# Patient Record
Sex: Male | Born: 1935 | Race: White | Hispanic: No | Marital: Married | Smoking: Never smoker
Health system: Southern US, Community
[De-identification: ages and names within clinical notes are randomized; demographics above are authoritative.]

## PROBLEM LIST (undated history)

## (undated) DIAGNOSIS — M5136 Other intervertebral disc degeneration, lumbar region: Secondary | ICD-10-CM

## (undated) DIAGNOSIS — I4891 Unspecified atrial fibrillation: Secondary | ICD-10-CM

## (undated) DIAGNOSIS — R739 Hyperglycemia, unspecified: Secondary | ICD-10-CM

## (undated) DIAGNOSIS — M5116 Intervertebral disc disorders with radiculopathy, lumbar region: Secondary | ICD-10-CM

## (undated) DIAGNOSIS — I480 Paroxysmal atrial fibrillation: Secondary | ICD-10-CM

## (undated) DIAGNOSIS — M4802 Spinal stenosis, cervical region: Secondary | ICD-10-CM

## (undated) DIAGNOSIS — M25552 Pain in left hip: Secondary | ICD-10-CM

## (undated) DIAGNOSIS — S5002XA Contusion of left elbow, initial encounter: Secondary | ICD-10-CM

## (undated) DIAGNOSIS — C9110 Chronic lymphocytic leukemia of B-cell type not having achieved remission: Secondary | ICD-10-CM

## (undated) DIAGNOSIS — M47816 Spondylosis without myelopathy or radiculopathy, lumbar region: Secondary | ICD-10-CM

## (undated) DIAGNOSIS — E7800 Pure hypercholesterolemia, unspecified: Secondary | ICD-10-CM

## (undated) DIAGNOSIS — R55 Syncope and collapse: Secondary | ICD-10-CM

## (undated) DIAGNOSIS — E785 Hyperlipidemia, unspecified: Secondary | ICD-10-CM

## (undated) DIAGNOSIS — I38 Endocarditis, valve unspecified: Secondary | ICD-10-CM

## (undated) DIAGNOSIS — M1990 Unspecified osteoarthritis, unspecified site: Secondary | ICD-10-CM

## (undated) DIAGNOSIS — S0083XA Contusion of other part of head, initial encounter: Secondary | ICD-10-CM

## (undated) DIAGNOSIS — R29810 Facial weakness: Secondary | ICD-10-CM

## (undated) DIAGNOSIS — I639 Cerebral infarction, unspecified: Secondary | ICD-10-CM

## (undated) DIAGNOSIS — G5600 Carpal tunnel syndrome, unspecified upper limb: Secondary | ICD-10-CM

## (undated) DIAGNOSIS — G43909 Migraine, unspecified, not intractable, without status migrainosus: Secondary | ICD-10-CM

## (undated) DIAGNOSIS — H409 Unspecified glaucoma: Secondary | ICD-10-CM

## (undated) DIAGNOSIS — M5030 Other cervical disc degeneration, unspecified cervical region: Secondary | ICD-10-CM

## (undated) DIAGNOSIS — G459 Transient cerebral ischemic attack, unspecified: Secondary | ICD-10-CM

## (undated) DIAGNOSIS — M5410 Radiculopathy, site unspecified: Secondary | ICD-10-CM

## (undated) HISTORY — DX: Intervertebral disc disorders with radiculopathy, lumbar region: M51.16

## (undated) HISTORY — DX: Paroxysmal atrial fibrillation: I48.0

## (undated) HISTORY — DX: Unspecified osteoarthritis, unspecified site: M19.90

## (undated) HISTORY — DX: Unspecified atrial fibrillation: I48.91

## (undated) HISTORY — DX: Hyperlipidemia, unspecified: E78.5

## (undated) HISTORY — DX: Pure hypercholesterolemia, unspecified: E78.00

## (undated) HISTORY — DX: Carpal tunnel syndrome, unspecified upper limb: G56.00

## (undated) HISTORY — PX: SPINE SURGERY: SHX786

## (undated) HISTORY — DX: Migraine, unspecified, not intractable, without status migrainosus: G43.909

## (undated) HISTORY — DX: Unspecified glaucoma: H40.9

## (undated) HISTORY — DX: Endocarditis, valve unspecified: I38

## (undated) HISTORY — PX: HERNIA REPAIR: SHX51

## (undated) HISTORY — DX: Facial weakness: R29.810

## (undated) HISTORY — DX: Contusion of left elbow, initial encounter: S50.02XA

## (undated) HISTORY — DX: Syncope and collapse: R55

## (undated) HISTORY — DX: Pain in left hip: M25.552

## (undated) HISTORY — DX: Contusion of other part of head, initial encounter: S00.83XA

## (undated) HISTORY — DX: Cerebral infarction, unspecified: I63.9

## (undated) HISTORY — DX: Spondylosis without myelopathy or radiculopathy, lumbar region: M47.816

## (undated) HISTORY — DX: Other cervical disc degeneration, unspecified cervical region: M50.30

## (undated) HISTORY — DX: Spinal stenosis, cervical region: M48.02

## (undated) HISTORY — DX: Radiculopathy, site unspecified: M54.10

## (undated) HISTORY — DX: Hyperglycemia, unspecified: R73.9

## (undated) HISTORY — DX: Other intervertebral disc degeneration, lumbar region: M51.36

## (undated) HISTORY — PX: OTHER SURGICAL HISTORY: SHX169

## (undated) HISTORY — DX: Transient cerebral ischemic attack, unspecified: G45.9

---

## 2003-10-14 ENCOUNTER — Encounter: Admission: RE | Admit: 2003-10-14 | Discharge: 2003-10-14 | Payer: Self-pay | Admitting: Neurosurgery

## 2003-11-26 ENCOUNTER — Inpatient Hospital Stay (HOSPITAL_COMMUNITY): Admission: RE | Admit: 2003-11-26 | Discharge: 2003-11-27 | Payer: Self-pay | Admitting: Neurosurgery

## 2004-03-09 ENCOUNTER — Other Ambulatory Visit: Payer: Self-pay

## 2004-03-10 ENCOUNTER — Other Ambulatory Visit: Payer: Self-pay

## 2005-01-26 ENCOUNTER — Ambulatory Visit: Payer: Self-pay | Admitting: Ophthalmology

## 2005-03-16 ENCOUNTER — Ambulatory Visit: Payer: Self-pay | Admitting: Ophthalmology

## 2005-10-16 ENCOUNTER — Ambulatory Visit: Payer: Self-pay | Admitting: Family Medicine

## 2005-10-25 ENCOUNTER — Ambulatory Visit: Payer: Self-pay | Admitting: Family Medicine

## 2007-05-17 ENCOUNTER — Ambulatory Visit: Payer: Self-pay | Admitting: Family Medicine

## 2009-11-17 ENCOUNTER — Ambulatory Visit: Payer: Self-pay | Admitting: Gastroenterology

## 2009-11-18 ENCOUNTER — Ambulatory Visit: Payer: Self-pay | Admitting: Oncology

## 2009-11-20 ENCOUNTER — Inpatient Hospital Stay: Payer: Self-pay | Admitting: Internal Medicine

## 2009-11-21 ENCOUNTER — Ambulatory Visit: Payer: Self-pay | Admitting: Cardiovascular Disease

## 2009-12-10 ENCOUNTER — Ambulatory Visit: Payer: Self-pay | Admitting: Oncology

## 2009-12-19 ENCOUNTER — Ambulatory Visit: Payer: Self-pay | Admitting: Oncology

## 2010-01-18 ENCOUNTER — Ambulatory Visit: Payer: Self-pay | Admitting: Oncology

## 2010-04-15 ENCOUNTER — Ambulatory Visit: Payer: Self-pay | Admitting: Oncology

## 2010-04-20 ENCOUNTER — Ambulatory Visit: Payer: Self-pay | Admitting: Oncology

## 2010-07-16 ENCOUNTER — Ambulatory Visit: Payer: Self-pay | Admitting: Oncology

## 2010-07-21 ENCOUNTER — Ambulatory Visit: Payer: Self-pay | Admitting: Oncology

## 2010-10-16 ENCOUNTER — Ambulatory Visit: Payer: Self-pay | Admitting: Oncology

## 2010-10-21 ENCOUNTER — Ambulatory Visit: Payer: Self-pay | Admitting: Oncology

## 2011-01-14 ENCOUNTER — Ambulatory Visit: Payer: Self-pay | Admitting: Oncology

## 2011-01-19 ENCOUNTER — Ambulatory Visit: Payer: Self-pay | Admitting: Oncology

## 2011-02-19 ENCOUNTER — Ambulatory Visit: Payer: Self-pay | Admitting: Oncology

## 2011-04-15 ENCOUNTER — Ambulatory Visit: Payer: Self-pay | Admitting: Oncology

## 2011-04-21 ENCOUNTER — Ambulatory Visit: Payer: Self-pay | Admitting: Oncology

## 2011-07-22 ENCOUNTER — Ambulatory Visit: Payer: Self-pay | Admitting: Oncology

## 2011-08-21 ENCOUNTER — Ambulatory Visit: Payer: Self-pay | Admitting: Oncology

## 2011-10-22 ENCOUNTER — Ambulatory Visit: Payer: Self-pay | Admitting: Oncology

## 2011-10-22 LAB — CBC CANCER CENTER
Basophil %: 0.9 %
Eosinophil #: 0 x10 3/mm (ref 0.0–0.7)
Eosinophil %: 0.2 %
HGB: 15.1 g/dL (ref 13.0–18.0)
Lymphocyte %: 64.4 %
Neutrophil %: 31.4 %
RBC: 4.58 10*6/uL (ref 4.40–5.90)

## 2011-11-19 ENCOUNTER — Ambulatory Visit: Payer: Self-pay | Admitting: Oncology

## 2012-01-21 ENCOUNTER — Ambulatory Visit: Payer: Self-pay | Admitting: Internal Medicine

## 2012-01-21 LAB — CBC CANCER CENTER
Basophil #: 0.1 x10 3/mm (ref 0.0–0.1)
Basophil %: 0.4 %
Eosinophil #: 0.2 x10 3/mm (ref 0.0–0.7)
Eosinophil %: 0.9 %
HGB: 14.6 g/dL (ref 13.0–18.0)
MCH: 32.8 pg (ref 26.0–34.0)
Monocyte #: 0.7 x10 3/mm (ref 0.2–1.0)
Neutrophil #: 4.2 x10 3/mm (ref 1.4–6.5)
RBC: 4.44 10*6/uL (ref 4.40–5.90)
RDW: 13.6 % (ref 11.5–14.5)

## 2012-02-19 ENCOUNTER — Ambulatory Visit: Payer: Self-pay | Admitting: Internal Medicine

## 2012-02-29 ENCOUNTER — Ambulatory Visit: Payer: Self-pay | Admitting: Rheumatology

## 2012-04-24 ENCOUNTER — Ambulatory Visit: Payer: Self-pay | Admitting: Oncology

## 2012-04-24 LAB — CBC CANCER CENTER
Basophil #: 0.1 x10 3/mm (ref 0.0–0.1)
Lymphocyte #: 17 x10 3/mm — ABNORMAL HIGH (ref 1.0–3.6)
MCV: 100 fL (ref 80–100)
Monocyte %: 3.5 %
Neutrophil %: 18.4 %
Platelet: 144 x10 3/mm — ABNORMAL LOW (ref 150–440)
RDW: 13.2 % (ref 11.5–14.5)
WBC: 22 x10 3/mm — ABNORMAL HIGH (ref 3.8–10.6)

## 2012-04-24 LAB — IRON AND TIBC
Iron Saturation: 50 %
Unbound Iron-Bind.Cap.: 163 ug/dL

## 2012-05-21 ENCOUNTER — Ambulatory Visit: Payer: Self-pay | Admitting: Oncology

## 2012-06-20 ENCOUNTER — Ambulatory Visit: Payer: Self-pay | Admitting: Oncology

## 2012-07-21 ENCOUNTER — Ambulatory Visit: Payer: Self-pay | Admitting: Oncology

## 2012-07-28 LAB — CBC CANCER CENTER
Eosinophil #: 0.4 x10 3/mm (ref 0.0–0.7)
HCT: 46.2 % (ref 40.0–52.0)
Lymphocyte #: 18.6 x10 3/mm — ABNORMAL HIGH (ref 1.0–3.6)
MCH: 31.9 pg (ref 26.0–34.0)
MCHC: 31.6 g/dL — ABNORMAL LOW (ref 32.0–36.0)
MCV: 101 fL — ABNORMAL HIGH (ref 80–100)
Monocyte #: 0.8 x10 3/mm (ref 0.2–1.0)
Neutrophil #: 4.1 x10 3/mm (ref 1.4–6.5)
Platelet: 143 x10 3/mm — ABNORMAL LOW (ref 150–440)
RDW: 13.2 % (ref 11.5–14.5)

## 2012-08-20 ENCOUNTER — Ambulatory Visit: Payer: Self-pay | Admitting: Oncology

## 2012-09-20 ENCOUNTER — Ambulatory Visit: Payer: Self-pay | Admitting: Oncology

## 2012-10-27 ENCOUNTER — Ambulatory Visit: Payer: Self-pay | Admitting: Oncology

## 2012-10-27 LAB — CBC CANCER CENTER
Basophil #: 0.1 x10 3/mm (ref 0.0–0.1)
Basophil %: 0.5 %
Eosinophil #: 0.3 x10 3/mm (ref 0.0–0.7)
HCT: 47.3 % (ref 40.0–52.0)
MCH: 32.3 pg (ref 26.0–34.0)
MCV: 99 fL (ref 80–100)
Monocyte %: 3.3 %
WBC: 31.5 x10 3/mm — ABNORMAL HIGH (ref 3.8–10.6)

## 2012-11-14 ENCOUNTER — Ambulatory Visit: Payer: Self-pay | Admitting: Physical Medicine and Rehabilitation

## 2012-11-16 ENCOUNTER — Observation Stay: Payer: Self-pay | Admitting: Internal Medicine

## 2012-11-16 LAB — BASIC METABOLIC PANEL
BUN: 18 mg/dL (ref 7–18)
Calcium, Total: 8.6 mg/dL (ref 8.5–10.1)
EGFR (African American): >60
Glucose: 96 mg/dL (ref 65–99)
Potassium: 4.3 mmol/L (ref 3.5–5.1)

## 2012-11-16 LAB — CK TOTAL AND CKMB (NOT AT ARMC): CK-MB: 1.2 ng/mL (ref 0.5–3.6)

## 2012-11-16 LAB — CBC
HGB: 15.2 g/dL (ref 13.0–18.0)
MCV: 99 fL (ref 80–100)
Platelet: 171 10*3/uL (ref 150–440)
RDW: 13.5 % (ref 11.5–14.5)

## 2012-11-16 LAB — MAGNESIUM: Magnesium: 2.2 mg/dL

## 2012-11-16 LAB — TSH: Thyroid Stimulating Horm: 2.11 u[IU]/mL

## 2012-11-17 LAB — CBC WITH DIFFERENTIAL/PLATELET
Eosinophil %: 1 %
HCT: 42.8 % (ref 40.0–52.0)
HGB: 14.1 g/dL (ref 13.0–18.0)
Lymphocyte %: 82.8 %
MCHC: 32.8 g/dL (ref 32.0–36.0)
Monocyte #: 0.9 x10 3/mm (ref 0.2–1.0)
Monocyte %: 3.3 %
Platelet: 143 10*3/uL — ABNORMAL LOW (ref 150–440)

## 2012-11-17 LAB — COMPREHENSIVE METABOLIC PANEL
Anion Gap: 3 — ABNORMAL LOW (ref 7–16)
Bilirubin,Total: 0.4 mg/dL (ref 0.2–1.0)
Co2: 29 mmol/L (ref 21–32)
Creatinine: 0.79 mg/dL (ref 0.60–1.30)
EGFR (African American): >60
SGPT (ALT): 21 U/L (ref 12–78)
Sodium: 145 mmol/L (ref 136–145)

## 2012-11-17 LAB — CK TOTAL AND CKMB (NOT AT ARMC)
CK, Total: 54 U/L (ref 35–232)
CK-MB: 1.2 ng/mL (ref 0.5–3.6)
CK-MB: 1.3 ng/mL (ref 0.5–3.6)

## 2012-11-18 ENCOUNTER — Ambulatory Visit: Payer: Self-pay | Admitting: Oncology

## 2012-12-14 LAB — COMPREHENSIVE METABOLIC PANEL
BUN: 19 mg/dL — ABNORMAL HIGH (ref 7–18)
Co2: 27 mmol/L (ref 21–32)
EGFR (Non-African Amer.): >60
Glucose: 119 mg/dL — ABNORMAL HIGH (ref 65–99)
Osmolality: 275 (ref 275–301)
SGPT (ALT): 21 U/L (ref 12–78)
Sodium: 136 mmol/L (ref 136–145)

## 2012-12-14 LAB — CBC
HCT: 44.5 % (ref 40.0–52.0)
MCH: 32.5 pg (ref 26.0–34.0)
MCHC: 33.1 g/dL (ref 32.0–36.0)
MCV: 98 fL (ref 80–100)
Platelet: 100 10*3/uL — ABNORMAL LOW (ref 150–440)

## 2012-12-14 LAB — URINALYSIS, COMPLETE
Bacteria: NONE SEEN
Blood: NEGATIVE
Glucose,UR: NEGATIVE mg/dL (ref 0–75)
Leukocyte Esterase: NEGATIVE
Nitrite: NEGATIVE
Ph: 7 (ref 4.5–8.0)
Protein: NEGATIVE
RBC,UR: 9 /HPF (ref 0–5)
Specific Gravity: 1.016 (ref 1.003–1.030)
WBC UR: 1 /HPF (ref 0–5)

## 2012-12-14 LAB — PROTIME-INR
INR: 1.1
Prothrombin Time: 13.9 secs (ref 11.5–14.7)

## 2012-12-14 LAB — TROPONIN I: Troponin-I: <0.02 ng/mL

## 2012-12-15 ENCOUNTER — Observation Stay: Payer: Self-pay | Admitting: Student

## 2012-12-15 LAB — BASIC METABOLIC PANEL
Anion Gap: 4 — ABNORMAL LOW (ref 7–16)
BUN: 18 mg/dL (ref 7–18)
Co2: 28 mmol/L (ref 21–32)
Co2: 26 mmol/L (ref 21–32)
Creatinine: 0.8 mg/dL (ref 0.60–1.30)
EGFR (African American): >60
EGFR (Non-African Amer.): >60
EGFR (Non-African Amer.): >60
Glucose: 129 mg/dL — ABNORMAL HIGH (ref 65–99)
Osmolality: 279 (ref 275–301)
Osmolality: 279 (ref 275–301)
Potassium: 3.9 mmol/L (ref 3.5–5.1)
Sodium: 138 mmol/L (ref 136–145)
Sodium: 139 mmol/L (ref 136–145)

## 2012-12-15 LAB — URINALYSIS, COMPLETE
Bilirubin,UR: NEGATIVE
Protein: NEGATIVE
Squamous Epithelial: NONE SEEN
WBC UR: 2 /HPF (ref 0–5)

## 2012-12-15 LAB — CBC WITH DIFFERENTIAL/PLATELET
Eosinophil #: 0 10*3/uL (ref 0.0–0.7)
MCH: 33.1 pg (ref 26.0–34.0)
MCV: 99 fL (ref 80–100)
Neutrophil %: 22.2 %
Platelet: 94 10*3/uL — ABNORMAL LOW (ref 150–440)
RBC: 4.23 10*6/uL — ABNORMAL LOW (ref 4.40–5.90)

## 2012-12-16 LAB — CBC WITH DIFFERENTIAL/PLATELET
Bands: 3 %
Comment - H1-Com3: NORMAL
HCT: 47.5 % (ref 40.0–52.0)
HGB: 15.5 g/dL (ref 13.0–18.0)
MCH: 32.2 pg (ref 26.0–34.0)
MCV: 98 fL (ref 80–100)
Monocytes: 4 %
Platelet: 92 10*3/uL — ABNORMAL LOW (ref 150–440)
RBC: 4.83 10*6/uL (ref 4.40–5.90)
Variant Lymphocyte - H1-Rlymph: 2 %

## 2012-12-16 LAB — URINE CULTURE

## 2012-12-17 LAB — CBC WITH DIFFERENTIAL/PLATELET
HGB: 14.8 g/dL (ref 13.0–18.0)
MCH: 32.5 pg (ref 26.0–34.0)
MCHC: 33.2 g/dL (ref 32.0–36.0)
Monocytes: 5 %
RBC: 4.57 10*6/uL (ref 4.40–5.90)
RDW: 13.3 % (ref 11.5–14.5)
Segmented Neutrophils: 25 %
Variant Lymphocyte - H1-Rlymph: 22 %

## 2012-12-18 LAB — EXPECTORATED SPUTUM ASSESSMENT W GRAM STAIN, RFLX TO RESP C

## 2012-12-20 LAB — CULTURE, BLOOD (SINGLE)

## 2012-12-21 LAB — STOOL CULTURE

## 2013-01-25 ENCOUNTER — Ambulatory Visit: Payer: Self-pay | Admitting: Oncology

## 2013-01-26 LAB — CBC CANCER CENTER
Basophil: 1 %
HCT: 41.9 % (ref 40.0–52.0)
HGB: 13.6 g/dL (ref 13.0–18.0)
Lymphocytes: 66 %
MCH: 31.9 pg (ref 26.0–34.0)
RBC: 4.27 10*6/uL — ABNORMAL LOW (ref 4.40–5.90)
RDW: 13.4 % (ref 11.5–14.5)
Variant Lymphocyte: 12 %

## 2013-02-18 ENCOUNTER — Ambulatory Visit: Payer: Self-pay | Admitting: Oncology

## 2013-03-15 ENCOUNTER — Ambulatory Visit: Payer: Self-pay | Admitting: Physical Medicine and Rehabilitation

## 2013-04-20 ENCOUNTER — Ambulatory Visit: Payer: Self-pay | Admitting: Oncology

## 2013-05-02 LAB — CBC CANCER CENTER
Eosinophil #: 0.2 x10 3/mm (ref 0.0–0.7)
Eosinophil %: 0.8 %
HCT: 44.7 % (ref 40.0–52.0)
HGB: 15 g/dL (ref 13.0–18.0)
Lymphocyte #: 20.7 x10 3/mm — ABNORMAL HIGH (ref 1.0–3.6)
Lymphocyte %: 79.1 %
MCH: 32.6 pg (ref 26.0–34.0)
MCHC: 33.5 g/dL (ref 32.0–36.0)
MCV: 97 fL (ref 80–100)
Neutrophil #: 4.4 x10 3/mm (ref 1.4–6.5)
Neutrophil %: 16.8 %
Platelet: 157 x10 3/mm (ref 150–440)
RBC: 4.6 10*6/uL (ref 4.40–5.90)
RDW: 13.7 % (ref 11.5–14.5)
WBC: 26.1 x10 3/mm — ABNORMAL HIGH (ref 3.8–10.6)

## 2013-05-21 ENCOUNTER — Ambulatory Visit: Payer: Self-pay | Admitting: Oncology

## 2013-08-03 ENCOUNTER — Ambulatory Visit: Payer: Self-pay | Admitting: Oncology

## 2013-08-03 LAB — CBC CANCER CENTER
Basophil %: 0.4 %
HCT: 42.5 % (ref 40.0–52.0)
HGB: 14.1 g/dL (ref 13.0–18.0)
Lymphocyte #: 19.8 x10 3/mm — ABNORMAL HIGH (ref 1.0–3.6)
Lymphocyte %: 79.7 %
MCH: 32.3 pg (ref 26.0–34.0)
MCHC: 33.3 g/dL (ref 32.0–36.0)
Monocyte %: 2.9 %
Neutrophil #: 3.9 x10 3/mm (ref 1.4–6.5)
Neutrophil %: 15.7 %
RDW: 13.6 % (ref 11.5–14.5)
WBC: 24.9 x10 3/mm — ABNORMAL HIGH (ref 3.8–10.6)

## 2013-08-20 ENCOUNTER — Ambulatory Visit: Payer: Self-pay | Admitting: Oncology

## 2013-11-01 ENCOUNTER — Ambulatory Visit: Payer: Self-pay | Admitting: Oncology

## 2013-11-02 LAB — CBC CANCER CENTER
Basophil #: 0.1 x10 3/mm (ref 0.0–0.1)
Basophil %: 0.3 %
Eosinophil #: 0.3 x10 3/mm (ref 0.0–0.7)
Eosinophil %: 1.2 %
HCT: 41.4 % (ref 40.0–52.0)
HGB: 13.6 g/dL (ref 13.0–18.0)
LYMPHS ABS: 18.5 x10 3/mm — AB (ref 1.0–3.6)
LYMPHS PCT: 74.6 %
MCH: 32.7 pg (ref 26.0–34.0)
MCHC: 32.8 g/dL (ref 32.0–36.0)
MCV: 100 fL (ref 80–100)
Monocyte #: 0.8 x10 3/mm (ref 0.2–1.0)
Monocyte %: 3.2 %
NEUTROS ABS: 5.1 x10 3/mm (ref 1.4–6.5)
NEUTROS PCT: 20.7 %
Platelet: 110 x10 3/mm — ABNORMAL LOW (ref 150–440)
RBC: 4.16 10*6/uL — AB (ref 4.40–5.90)
RDW: 13.6 % (ref 11.5–14.5)
WBC: 24.8 x10 3/mm — AB (ref 3.8–10.6)

## 2013-11-09 ENCOUNTER — Other Ambulatory Visit: Payer: Self-pay | Admitting: Gastroenterology

## 2013-11-09 LAB — CLOSTRIDIUM DIFFICILE(ARMC)

## 2013-11-11 LAB — STOOL CULTURE

## 2013-11-18 ENCOUNTER — Ambulatory Visit: Payer: Self-pay | Admitting: Oncology

## 2013-11-22 ENCOUNTER — Inpatient Hospital Stay: Payer: Self-pay | Admitting: Internal Medicine

## 2013-11-22 LAB — URINALYSIS, COMPLETE
BLOOD: NEGATIVE
Bacteria: NONE SEEN
Bilirubin,UR: NEGATIVE
Glucose,UR: NEGATIVE mg/dL (ref 0–75)
Leukocyte Esterase: NEGATIVE
Nitrite: NEGATIVE
Ph: 5 (ref 4.5–8.0)
Protein: 30
RBC,UR: 1 /HPF (ref 0–5)
SPECIFIC GRAVITY: 1.021 (ref 1.003–1.030)
WBC UR: 1 /HPF (ref 0–5)

## 2013-11-22 LAB — COMPREHENSIVE METABOLIC PANEL
ALBUMIN: 3.1 g/dL — AB (ref 3.4–5.0)
Alkaline Phosphatase: 53 U/L
Anion Gap: 5 — ABNORMAL LOW (ref 7–16)
BUN: 18 mg/dL (ref 7–18)
Bilirubin,Total: 0.9 mg/dL (ref 0.2–1.0)
CO2: 27 mmol/L (ref 21–32)
Calcium, Total: 8.1 mg/dL — ABNORMAL LOW (ref 8.5–10.1)
Chloride: 106 mmol/L (ref 98–107)
Creatinine: 0.84 mg/dL (ref 0.60–1.30)
EGFR (African American): >60
Glucose: 110 mg/dL — ABNORMAL HIGH (ref 65–99)
OSMOLALITY: 278 (ref 275–301)
POTASSIUM: 3 mmol/L — AB (ref 3.5–5.1)
SGOT(AST): 52 U/L — ABNORMAL HIGH (ref 15–37)
SGPT (ALT): 38 U/L (ref 12–78)
SODIUM: 138 mmol/L (ref 136–145)
Total Protein: 6.6 g/dL (ref 6.4–8.2)

## 2013-11-22 LAB — CBC
HCT: 41.2 % (ref 40.0–52.0)
HGB: 13.6 g/dL (ref 13.0–18.0)
MCH: 32.4 pg (ref 26.0–34.0)
MCHC: 33 g/dL (ref 32.0–36.0)
MCV: 98 fL (ref 80–100)
Platelet: 163 10*3/uL (ref 150–440)
RBC: 4.19 10*6/uL — AB (ref 4.40–5.90)
RDW: 13.5 % (ref 11.5–14.5)
WBC: 21.8 10*3/uL — ABNORMAL HIGH (ref 3.8–10.6)

## 2013-11-22 LAB — TROPONIN I: TROPONIN-I: 0.05 ng/mL

## 2013-11-22 LAB — PROTIME-INR
INR: 1
Prothrombin Time: 13.2 secs (ref 11.5–14.7)

## 2013-11-22 LAB — DIGOXIN LEVEL: Digoxin: 0.4 ng/mL

## 2013-11-22 LAB — PRO B NATRIURETIC PEPTIDE: B-TYPE NATIURETIC PEPTID: 1578 pg/mL — AB (ref 0–450)

## 2013-11-23 LAB — CBC WITH DIFFERENTIAL/PLATELET
Comment - H1-Com1: NORMAL
Comment - H1-Com2: NORMAL
HCT: 37.5 % — AB (ref 40.0–52.0)
HGB: 12.2 g/dL — ABNORMAL LOW (ref 13.0–18.0)
Lymphocytes: 67 %
MCH: 32.3 pg (ref 26.0–34.0)
MCHC: 32.7 g/dL (ref 32.0–36.0)
MCV: 99 fL (ref 80–100)
Monocytes: 2 %
Platelet: 145 10*3/uL — ABNORMAL LOW (ref 150–440)
RBC: 3.79 10*6/uL — AB (ref 4.40–5.90)
RDW: 13.6 % (ref 11.5–14.5)
SEGMENTED NEUTROPHILS: 17 %
Variant Lymphocyte - H1-Rlymph: 14 %
WBC: 33.5 10*3/uL — ABNORMAL HIGH (ref 3.8–10.6)

## 2013-11-23 LAB — BASIC METABOLIC PANEL
Anion Gap: 5 — ABNORMAL LOW (ref 7–16)
BUN: 16 mg/dL (ref 7–18)
CHLORIDE: 111 mmol/L — AB (ref 98–107)
CREATININE: 0.82 mg/dL (ref 0.60–1.30)
Calcium, Total: 7.9 mg/dL — ABNORMAL LOW (ref 8.5–10.1)
Co2: 28 mmol/L (ref 21–32)
EGFR (African American): >60
EGFR (Non-African Amer.): >60
Glucose: 83 mg/dL (ref 65–99)
OSMOLALITY: 287 (ref 275–301)
Potassium: 3.7 mmol/L (ref 3.5–5.1)
Sodium: 144 mmol/L (ref 136–145)

## 2013-11-23 LAB — RAPID INFLUENZA A&B ANTIGENS (ARMC ONLY)

## 2013-11-24 LAB — CBC WITH DIFFERENTIAL/PLATELET
BASOS ABS: 0 10*3/uL (ref 0.0–0.1)
BASOS PCT: 0.1 %
EOS ABS: 0.2 10*3/uL (ref 0.0–0.7)
Eosinophil %: 0.7 %
HCT: 37.8 % — AB (ref 40.0–52.0)
HGB: 12.8 g/dL — AB (ref 13.0–18.0)
LYMPHS ABS: 21.3 10*3/uL — AB (ref 1.0–3.6)
Lymphocyte %: 76.2 %
MCH: 33 pg (ref 26.0–34.0)
MCHC: 34 g/dL (ref 32.0–36.0)
MCV: 97 fL (ref 80–100)
MONO ABS: 0.9 x10 3/mm (ref 0.2–1.0)
Monocyte %: 3.2 %
Neutrophil #: 5.5 10*3/uL (ref 1.4–6.5)
Neutrophil %: 19.8 %
PLATELETS: 218 10*3/uL (ref 150–440)
RBC: 3.88 10*6/uL — ABNORMAL LOW (ref 4.40–5.90)
RDW: 13.6 % (ref 11.5–14.5)
WBC: 28 10*3/uL — AB (ref 3.8–10.6)

## 2013-11-25 LAB — EXPECTORATED SPUTUM ASSESSMENT W REFEX TO RESP CULTURE

## 2013-11-27 LAB — CULTURE, BLOOD (SINGLE)

## 2014-01-23 DIAGNOSIS — M47816 Spondylosis without myelopathy or radiculopathy, lumbar region: Secondary | ICD-10-CM

## 2014-01-23 DIAGNOSIS — M4802 Spinal stenosis, cervical region: Secondary | ICD-10-CM

## 2014-01-23 DIAGNOSIS — M5030 Other cervical disc degeneration, unspecified cervical region: Secondary | ICD-10-CM | POA: Insufficient documentation

## 2014-01-23 DIAGNOSIS — M5410 Radiculopathy, site unspecified: Secondary | ICD-10-CM

## 2014-01-23 DIAGNOSIS — G5600 Carpal tunnel syndrome, unspecified upper limb: Secondary | ICD-10-CM

## 2014-01-23 HISTORY — DX: Radiculopathy, site unspecified: M54.10

## 2014-01-23 HISTORY — DX: Carpal tunnel syndrome, unspecified upper limb: G56.00

## 2014-01-23 HISTORY — DX: Other cervical disc degeneration, unspecified cervical region: M50.30

## 2014-01-23 HISTORY — DX: Spondylosis without myelopathy or radiculopathy, lumbar region: M47.816

## 2014-01-23 HISTORY — DX: Spinal stenosis, cervical region: M48.02

## 2014-01-31 ENCOUNTER — Ambulatory Visit: Payer: Self-pay | Admitting: Oncology

## 2014-01-31 LAB — CBC CANCER CENTER
BASOS PCT: 0.1 %
Basophil #: 0 x10 3/mm (ref 0.0–0.1)
EOS ABS: 0.5 x10 3/mm (ref 0.0–0.7)
Eosinophil %: 1.3 %
HCT: 43.4 % (ref 40.0–52.0)
HGB: 14.2 g/dL (ref 13.0–18.0)
Lymphocyte #: 31.7 x10 3/mm — ABNORMAL HIGH (ref 1.0–3.6)
Lymphocyte %: 86.1 %
MCH: 31.9 pg (ref 26.0–34.0)
MCHC: 32.7 g/dL (ref 32.0–36.0)
MCV: 98 fL (ref 80–100)
MONO ABS: 0.9 x10 3/mm (ref 0.2–1.0)
Monocyte %: 2.4 %
NEUTROS PCT: 10.1 %
Neutrophil #: 3.7 x10 3/mm (ref 1.4–6.5)
PLATELETS: 132 x10 3/mm — AB (ref 150–440)
RBC: 4.45 10*6/uL (ref 4.40–5.90)
RDW: 13.7 % (ref 11.5–14.5)
WBC: 36.8 x10 3/mm — ABNORMAL HIGH (ref 3.8–10.6)

## 2014-02-18 ENCOUNTER — Ambulatory Visit: Payer: Self-pay | Admitting: Oncology

## 2014-04-08 ENCOUNTER — Ambulatory Visit: Payer: Self-pay | Admitting: Specialist

## 2014-04-08 DIAGNOSIS — I4891 Unspecified atrial fibrillation: Secondary | ICD-10-CM

## 2014-04-08 DIAGNOSIS — E785 Hyperlipidemia, unspecified: Secondary | ICD-10-CM

## 2014-04-08 DIAGNOSIS — I38 Endocarditis, valve unspecified: Secondary | ICD-10-CM

## 2014-04-08 HISTORY — DX: Hyperlipidemia, unspecified: E78.5

## 2014-04-08 HISTORY — DX: Endocarditis, valve unspecified: I38

## 2014-04-08 HISTORY — DX: Unspecified atrial fibrillation: I48.91

## 2014-04-08 LAB — CBC WITH DIFFERENTIAL/PLATELET
BASOS ABS: 0.1 10*3/uL (ref 0.0–0.1)
BASOS PCT: 0.2 %
Eosinophil #: 0.4 10*3/uL (ref 0.0–0.7)
Eosinophil %: 1.2 %
HCT: 42 % (ref 40.0–52.0)
HGB: 13.5 g/dL (ref 13.0–18.0)
LYMPHS ABS: 27 10*3/uL — AB (ref 1.0–3.6)
Lymphocyte %: 80.8 %
MCH: 32.2 pg (ref 26.0–34.0)
MCHC: 32.1 g/dL (ref 32.0–36.0)
MCV: 100 fL (ref 80–100)
MONOS PCT: 3.2 %
Monocyte #: 1.1 x10 3/mm — ABNORMAL HIGH (ref 0.2–1.0)
Neutrophil #: 4.9 10*3/uL (ref 1.4–6.5)
Neutrophil %: 14.6 %
PLATELETS: 143 10*3/uL — AB (ref 150–440)
RBC: 4.2 10*6/uL — ABNORMAL LOW (ref 4.40–5.90)
RDW: 13.1 % (ref 11.5–14.5)
WBC: 32.9 10*3/uL — ABNORMAL HIGH (ref 3.8–10.6)

## 2014-04-08 LAB — APTT: Activated PTT: 27.1 secs (ref 23.6–35.9)

## 2014-04-08 LAB — PROTIME-INR
INR: 1
Prothrombin Time: 13.5 secs (ref 11.5–14.7)

## 2014-04-08 LAB — POTASSIUM: Potassium: 4.1 mmol/L (ref 3.5–5.1)

## 2014-04-10 ENCOUNTER — Ambulatory Visit: Payer: Self-pay | Admitting: Specialist

## 2014-05-03 ENCOUNTER — Ambulatory Visit: Payer: Self-pay | Admitting: Oncology

## 2014-05-03 LAB — CBC CANCER CENTER
BASOS ABS: 0 x10 3/mm (ref 0.0–0.1)
Basophil %: 0.1 %
EOS ABS: 0 x10 3/mm (ref 0.0–0.7)
EOS PCT: 0 %
HCT: 44.1 % (ref 40.0–52.0)
HGB: 14 g/dL (ref 13.0–18.0)
LYMPHS PCT: 69.9 %
Lymphocyte #: 37 x10 3/mm — ABNORMAL HIGH (ref 1.0–3.6)
MCH: 31.4 pg (ref 26.0–34.0)
MCHC: 31.7 g/dL — ABNORMAL LOW (ref 32.0–36.0)
MCV: 99 fL (ref 80–100)
Monocyte #: 1.7 x10 3/mm — ABNORMAL HIGH (ref 0.2–1.0)
Monocyte %: 3.2 %
NEUTROS ABS: 14.2 x10 3/mm — AB (ref 1.4–6.5)
NEUTROS PCT: 26.8 %
PLATELETS: 184 x10 3/mm (ref 150–440)
RBC: 4.45 10*6/uL (ref 4.40–5.90)
RDW: 12.9 % (ref 11.5–14.5)
WBC: 53 x10 3/mm — ABNORMAL HIGH (ref 3.8–10.6)

## 2014-05-21 ENCOUNTER — Ambulatory Visit: Payer: Self-pay | Admitting: Oncology

## 2014-08-05 ENCOUNTER — Ambulatory Visit: Payer: Self-pay | Admitting: Oncology

## 2014-08-05 LAB — CBC CANCER CENTER
Basophil #: 0.1 x10 3/mm (ref 0.0–0.1)
Basophil %: 0.3 %
Eosinophil #: 0.4 x10 3/mm (ref 0.0–0.7)
Eosinophil %: 1.1 %
HCT: 42.4 % (ref 40.0–52.0)
HGB: 13.7 g/dL (ref 13.0–18.0)
LYMPHS ABS: 31 x10 3/mm — AB (ref 1.0–3.6)
LYMPHS PCT: 81.8 %
MCH: 32 pg (ref 26.0–34.0)
MCHC: 32.3 g/dL (ref 32.0–36.0)
MCV: 99 fL (ref 80–100)
MONO ABS: 1 x10 3/mm (ref 0.2–1.0)
MONOS PCT: 2.7 %
Neutrophil #: 5.3 x10 3/mm (ref 1.4–6.5)
Neutrophil %: 14.1 %
Platelet: 145 x10 3/mm — ABNORMAL LOW (ref 150–440)
RBC: 4.28 10*6/uL — ABNORMAL LOW (ref 4.40–5.90)
RDW: 14 % (ref 11.5–14.5)
WBC: 37.9 x10 3/mm — AB (ref 3.8–10.6)

## 2014-08-20 ENCOUNTER — Ambulatory Visit: Payer: Self-pay | Admitting: Oncology

## 2014-09-11 DIAGNOSIS — M51369 Other intervertebral disc degeneration, lumbar region without mention of lumbar back pain or lower extremity pain: Secondary | ICD-10-CM | POA: Insufficient documentation

## 2014-09-11 DIAGNOSIS — M5116 Intervertebral disc disorders with radiculopathy, lumbar region: Secondary | ICD-10-CM | POA: Insufficient documentation

## 2014-09-11 DIAGNOSIS — M5136 Other intervertebral disc degeneration, lumbar region: Secondary | ICD-10-CM

## 2014-09-11 HISTORY — DX: Other intervertebral disc degeneration, lumbar region: M51.36

## 2014-09-11 HISTORY — DX: Intervertebral disc disorders with radiculopathy, lumbar region: M51.16

## 2014-09-11 HISTORY — DX: Other intervertebral disc degeneration, lumbar region without mention of lumbar back pain or lower extremity pain: M51.369

## 2014-09-16 DIAGNOSIS — C9110 Chronic lymphocytic leukemia of B-cell type not having achieved remission: Secondary | ICD-10-CM | POA: Insufficient documentation

## 2014-11-04 ENCOUNTER — Ambulatory Visit: Payer: Self-pay | Admitting: Oncology

## 2014-11-19 ENCOUNTER — Ambulatory Visit
Admit: 2014-11-19 | Disposition: A | Discharge status: Home / Self Care | Payer: Self-pay | Attending: Oncology | Admitting: Oncology

## 2015-01-10 NOTE — H&P (Signed)
PATIENT NAME:  Nicholas Elliott, BISS MR#:  [id number] DATE OF BIRTH:  [birth year]  DATE OF ADMISSION:  12/14/2012  REFERRING PHYSICIAN: Graciella Freer, MD  PRIMARY CARE PHYSICIAN: [name], MD  CHIEF COMPLAINT: Altered mental status, cough and fever and chills at home.  HISTORY OF PRESENT ILNESS:  The patient is an 81 year old gentleman with history of previous CVAs, coronary artery disease, as per the wife's information, history of migraines, hyperlipidemia, glaucoma, and CLL. The patient is coming here today with a history of week of cough that has become productive with the last 2 to 3 days with significant yellowish and thickish sputum. The patient has had significant chills and fever for the past 2 days with increased cough. He was taking some cold medicine he was very confused. He started having incontinence and he was unable to walk. He was very weak and he needed significant assistance  getting up from the chair. At his baseline, he is able to ambulate perfectly any problem and he is very sharp. The patient was brought to the ER, he was evaluated in the ER. He had a slight fever with a temperature of 99.4. Chest x-ray did not show any major changes other than possible congestive heart failure versus bronchitis or pneumonitis. A CT of the head did not show any acute abnormalities. The patient was given IV fluids and antibiotics and I was asked to evaluate the patient for admission.   REVIEW OF SYSTEMS: constitutional Positive fever. Positive chills. Positive fatigue and weakness. Negative significant weight loss or weight gain.  EYES: No blurry vision, double vision or changes on visual acuity. The patient has history of cataracts.  ENT: No tinnitus. No ear pain. Positive significant hearing loss. Negative sinus pain. No difficulty swallowing.  RESPIRATIONS:  Positive cough. Positive sputum within the last week, progressing to  thickness and yellow color in the last 2 days. No hemoptysis. No  significant dyspnea. No chronic obstructive pulmonary disease. He was slightly hypoxic here at the ER with oxygen saturations in the 90  and ABG on 88 on 2 liters nasal cannula.   CARDIOVASCULAR: Negative for chest pain, orthopnea, edema, syncope, as per wife. The patient has coronary artery disease and he takes Plavix for that. He also takes Plavix due to his transient ischemic attacks.  GASTROINTESTINAL: No nausea, vomiting, abdominal pain. No diarrhea, constipation or jaundice.  GENITOURINARY: No dysuria that the wife can tell me, but he has been having significant urinary incontinence with of the past 24 hours. This is not normal in him. No prostatitis.  ENDOCRINE: No polyuria, polydipsia. No cold or heat intolerance.  HEMATOLOGIC/LYMPHATIC: No easy bruising, no bleeding, no swollen glands.  MUSCULOSKELETAL: No significant joint pain. No gout.  NEUROLOGIC: No numbness. No tingling. Positive transient ischemic attacks in the past.  PSYCHIATRIC: Positive confusion, oriented only on person. No nervousness.  All of this information has been taken from the wife, the patient has not been able to communicate much with me.   PAST MEDICAL HISTORY: 1.  Transient ischemic attacks.  2.  Typical migraines.  3.  Osteoarthritis.  4.  Hyperlipidemia.  5.  Glaucoma.  6.  CLL.  7.  Hearing loss.  8.  Coronary artery disease.   PAST SURGICAL HISTORY:  1.  Throat surgery, due to dysphagia.  2.  Lumbosacral spine surgery.  3.  Right shoulder rotator cuff repair.  4.  C7-T1 foraminotomy.  5.  Cataract surgery.  6.  Right inguinal surgical repair.  7.  Colonoscopy.   CURRENT MEDICATIONS: Include aspirin 81 mg once daily, Crestor 10 mg a day, BIOflex once a day, Plavix 75 mg once a day, metoprolol 25 mg twice daily, tramadol 50 mg twice daily as needed for pain. Timolol eye drops.   ALLERGIES: No known drug allergies.   SOCIAL HISTORY: The patient is married and lives with his wife. He has never  smoked. He does not drink. He is retired.   FAMILY HISTORY: Positive for a stroke in his father at the age of 28, hypertension and diabetes in both parents and positive coronary artery disease, status post myocardial infarction in his brother.   PHYSICAL EXAMINATION: VITAL SIGNS: Blood pressure 116/68, pulse in between 100-110, right now 94, temperature 99.4, oxygen saturation 90% on room air 88% on 2 liters nasal cannula on ABG. Respirations up to 26.  GENERAL:  The patient is awake, he is alert. He is oriented in person mostly. No acute distress. No respiratory distress at this moment, he is comfortable.  HEENT: Pupils are equal and reactive. Extraocular movements are intact. Mucosa are dry. Anicteric sclerae. Pink conjunctivae. No oral lesions. No oropharyngeal exudates.  NECK: Supple. No JVD. No thyromegaly. No adenopathy. No carotid bruits. No rigidity.  CARDIOVASCULAR: Irregularly irregular rate with a systolic ejection murmur of 3/6 in aortic area. No radiation of the murmur. No tenderness to palpation of anterior chest wall. No displacement of PMI.  LUNGS: Positive bilateral rhonchi in both middle lobes and some crepitus on both lower lung areas and no use of accessory muscles. No dullness to percussion.  ABDOMEN: Soft, nontender, nondistended. No hepatosplenomegaly. No masses. Bowel sounds are positive.  GENITAL: Negative for external lesions.  EXTREMITIES: No edema, no cyanosis, no clubbing. Pulses +2. Capillary refill about 3 to 4 seconds.  SKIN:  Is dry with decreased turgor. No rashes or petechiae.  NEUROLOGIC: Cranial nerves II through XII intact. No focal findings. Strength is 5/5 on all 4 extremities.  PSYCHIATRIC: Mood is normal without any signs of agitation or anxiety. The patient is a poor cooperative.   MUSCULOSKELETAL: No significant joint effusions, lesions or erythema of joints. No rigidity of the joints.  HEMATOLOGIC AND LYMPHATIC: Negative for lymphadenopathy in neck or  supraclavicular areas.   LABORATORY, DIAGNOSTIC AND RADIOLOGIC DATA: CT of the head normal.  Chest x-ray positive pneumonitis versus increased vascular markings. No focal pneumonia.  Glucose 119, BUN 19, creatinine 0.84, calcium 7.9, magnesium 1.9. LFTs within normal limits. Troponin is negative. White count 18,000, red blood cells 4.54, platelet count 100  Review of previous data:  His platelets have been in the 170s to 140s, never down to 100 as far as I can tell. His white count has been as high as 31,000.   Urinalysis:  9 red blood cells, 1 white blood cells, negative nitrites and leukocyte esterase   ABG: pO2 88, pH 7.44, CO2 25, pCO2 38.   EKG: Atrial fibrillation. No ST depression or elevation.   ASSESSMENT AND PLAN: An 81 year old gentleman with history of transient ischemic attacks in the past, coronary artery disease, hearing loss, chronic lymphocytic leukemia, osteoarthritis, hyperlipidemia, who comes with altered mental status, fevers, chills, cough for 3-4 days with yellow sputum and decreased oxygen saturations.  1.  Serous. The patient has systemic inflammatory response syndrome. Based on his white blood count, although we know that this is chronic, but he also meets criteria by respiratory rate of 26, heart rate above 90 and altered mental status. The patient is going  to be a treated with antibiotics for a possible upper respiratory infection likely bronchitis versus developing pneumonia. Blood cultures taken. Urinalysis and urine culture repeated with INO catheterization, as the patient has red blood cells.  2.  Possible bronchitis with hypoxemia. Patient had a borderline acute respiratory failure with oxygen saturation 88% on 2 liters on ABG. The patient is not on any significant respiratory distress. This is likely cause from an infectious process in his lungs. Cannot rule out the possibility of fluid overload, but we are going to evaluate him closely. He does not seem to be in any  acute congestive heart failure or fluid overload at this moment.  3.  Atrial fibrillation. The patient has new onset atrial fibrillation likely due to infectious process. At this moment, we are going to treat him with oral metoprolol, if he comes out of control with heart rate above 100 and, 20. We are going to start him on Cardizem drip. At this moment, I do not think that that is going to be necessary. The tachycardia could be also related to dehydration as the patient looks significantly dehydrated. IV fluids have been ordered.  4.  Monitor lung status as we give IV fluids to prevent any fluid overload.  5.  Elevated white blood count this is chronic and is due to chronic lymphocytic leukemia.  6.  Dehydration. Continue IV fluids gently.  7.  Thrombocytopenia.  This is likely related to his chronic lymphocytic leukemia. We are going to monitor. At this moment, his platelets are 100. We are going to avoid heparin low molecular weight heparin. For now, we only do of mechanic compression devices.  8.  Monitor platelets.  9.  Coronary artery disease. The patient is taking Plavix for years. The wife states that he has had a told that he has a blockage in one of his arteries, although never had a myocardial infarction or  stents and the patient has been treated medically only.  10.  Transient ischemic attacks. He also takes Plavix for transient ischemic attacks. He is also on aspirin. No recent transient ischemic attacks.  11.  Hyperlipidemia. Continue statin.  12.  Gastrointestinal prophylaxis with proton pump inhibitor.   CODE STATUS:  Patient is a full code   TIME SPENT: I spent about 45 minutes with this patient.    ____________________________ Salvo Sink, MD rsg:cc D: 12/14/2012 23:19:49 ET T: 12/14/2012 23:45:13 ET JOB#: [id number]  cc: Euless Sink, MD, <Dictator> Marsia Cino America Brown MD ELECTRONICALLY SIGNED 12/15/2012 13:42

## 2015-01-10 NOTE — H&P (Signed)
PATIENT NAME:  Nicholas Elliott, Nicholas Elliott MR#:  [id number] DATE OF BIRTH:  [birth year]  DATE OF ADMISSION:  11/16/2012  PRIMARY CARE PHYSICIAN:  Youlanda Roys. Bronstein, MD  HISTORY OF PRESENT ILLNESS:  The patient is an 81 year old Caucasian male with past medical history significant for history of migraines, history of hyperlipidemia, questionable transient ischemic attacks who presented to the hospital with complaints of chest pains. Apparently, the patient was at his arthritis doctor's office when his blood pressure was checked as well as his heart rate was noted to be elevated. He was also complaining of some indigestion or chest pain symptoms. According to patient's wife as well as the patient himself, he has been having those pains in the chest for the past 6 months now. It initially started with indigestion; however since last Sunday which was a week ago, he has been having chest pains. The pain is described as sharp intermittent pain with no significant alleviating or aggravating factors. There is tightness in the middle of his chest with radiation to the left shoulder and accompanies the shortness of breath as well as some headaches. The pain would come for approximately 5 minutes or so and then it would disappear. It is unclear what actually would bring the pain back. However, the patient was noted to be in atrial flutter in the doctor's office and he was sent to the Emergency Room for further evaluation and possible admission.   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   PAST SURGICAL HISTORY:  History of throat surgery for polyp repair, lumbosacral spine surgery, right shoulder surgery, C7-T1 foraminotomy, cataract surgery in 2006, history of right inguinal  hernia repair and colonoscopy in the past.   MEDICATIONS:  According to the medical record, the patient is on aspirin 81 mg p.o. daily, Celebrex 100 mg p.o. twice weekly, Crestor 10 mg p.o. daily, Osteo Bi-Flex 250/200 mg 1 tablet twice daily, Plavix 75 mg p.o. daily, tramadol 50 mg p.o. twice daily, also timolol eyedrops at bedtime.   ALLERGIES:  No known drug allergies.   SOCIAL HISTORY:  He is retired, married. No tobacco, alcohol or illicit drugs.   FAMILY HISTORY:  Negative for GI malignancies. The patient's brother had an MI at the age of 49. The patient's sister unknown cancer. Father had stroke in his 44s. No history of hypertension or diabetes in the patient's family.   REVIEW OF SYSTEMS:   CONSTITUTIONAL: Positive for chest pains, weight loss of approximately 3 pounds since approximately 1 week ago, some glaucoma, some sinus congestion, shortness of breath intermittently, chest pains, also feeling like his heart is having arrhythmia, also having some shoulder pains, back pains and neck pains. Denies any high fevers or chills, fatigue, weakness, weight gain.  EYES: Denies any blurry vision, double vision, or cataracts.  ENT: Denies any tinnitus, allergies, epistaxis, sinus pain, dentures, difficulty swallowing.  RESPIRATORY: Denies any cough, wheezes, asthma, COPD.  CARDIOVASCULAR: Denies any orthopnea, dyspnea on exertion, palpitations or syncope.  GASTROINTESTINAL: Denies any nausea, vomiting, diarrhea, or constipation.  GENITOURINARY: Denies dysuria, hematuria, frequency, incontinence.  ENDOCRINE: Denies any polydipsia, nocturia, thyroid problems, heat or cold intolerance.  HEMATOLOGIC: Denies anemia, easy bruising, bleeding, swollen glands.  SKIN: Denies any acne, rash, lesions, or changes in moles.  MUSCULOSKELETAL: Denies arthritis, cramps,  swelling.  NEUROLOGIC: No numbness, epilepsy or tremor.  PSYCHIATRIC: Denies anxiety, insomnia or depression.   PHYSICAL  EXAMINATION: VITAL SIGNS: On arrival to the hospital, temperature is 97.8, pulse 111, respiratory rate 18, blood pressure 125/76, saturation was 96% on room air.  GENERAL: This is a well-developed, well-nourished Caucasian male in no significant distress sitting on the stretcher.  HEENT: His pupils are equal and reactive to light. Extraocular muscles are intact. No icterus or conjunctivitis. Has difficulty hearing. He has hearing aid in his ears. No pharyngeal erythema. Mucosa is dry.  NECK: No masses, supple, nontender. Thyroid is not enlarged. No adenopathy. No JVD or carotid bruits bilaterally. Full range of motion.  LUNGS: Crackles at bases. A few rhonchi were heard. No rales or wheezing noted on inspiration. No dullness to percussion. Not in overt respiratory distress.  CARDIOVASCULAR: S1, S2 appreciated. Rhythm is regular.  The patient is back in sinus rhythm on telemetry. The patient does have a 4/6 systolic murmur which is radiating to his neck. A bruit was heard on the left as well as all precordium. The chest is nontender to palpation. 1+ pedal pulses. No lower extremity edema, calf tenderness or cyanosis was noted.  ABDOMEN: Soft, nontender. Bowel sounds are present. No hepatosplenomegaly or masses were noted.  RECTAL: Deferred.  MUSCLE STRENGTH: Able to move all extremities. No cyanosis, degenerative joint disease or kyphosis. Gait is not tested.  SKIN: Denies any rashes, lesions, erythema, nodularity, induration. It was warm and dry to palpation.  LYMPHATICS: No adenopathy in the cervical region.  NEUROLOGICAL: Cranial nerves grossly intact. Sensory is intact. No dysarthria or aphasia. The patient is alert and oriented to time, person, place and is cooperative. Memory is good. The patient does have some dysarthria.  PSYCHIATRIC: No significant confusion, agitation or depression noted.   DIAGNOSTIC DATA:  EKG showed atrial flutter at 109 beats per minute, normal axis, variable AV block,  atrial flutter and no acute ST-T changes were noted. On lab studies, BMP is within normal limits. Magnesium level is normal at 2.2. Cardiac enzymes, first set, are negative. On CBC, white blood cell count is elevated at 29.5, hemoglobin was 15.2, platelet count 171. In the past, white blood cell count has been elevated as well. On 10/27/2012, it was 31.5. The patient's chest x-ray revealed normal cardiac silhouette, possible mild congestive heart failure and pulmonary vascular congestion.   ASSESSMENT AND PLAN:   1.  Chest pain. Admit to the patient to the medical floor starting him on beta-blockers, aspirin, nitroglycerin topically and heparin subcutaneously. Check cardiac enzymes x 3. Get Myoview stress test in the morning  2.  Atrial flutter. Continue beta-blockers for heart rate control. Get TSH, get echocardiogram and continue aspirin therapy for now.  3.  It appears the patient's atrial flutter converted into sinus rhythm now.  4.  History of transient ischemic attacks. Continue the patient on aspirin as well as Plavix.  5.  Chronic lymphocytic leukemia on supportive therapy.   TIME SPENT:  50 minutes.    ____________________________ Theodoro Grist, MD rv:si D: 11/16/2012 20:29:00 ET T: 11/16/2012 21:38:58 ET JOB#: [id number]  cc: Youlanda Roys. Lovie Macadamia, MD Theodoro Grist, MD, <Dictator>   N[place]bitt MD ELECTRONICALLY SIGNED 12/20/2012 15:54

## 2015-01-10 NOTE — Discharge Summary (Signed)
PATIENT NAME:  Nicholas Elliott, Nicholas Elliott MR#:  [id number] DATE OF BIRTH:  Apr 10, 1922  DATE OF ADMISSION:  11/16/2012 DATE OF DISCHARGE:  11/17/2012  PRIMARY CARE PHYSICIAN:  [name], MD  FINAL DIAGNOSES: 1.  Chest pain.  2.  Atrial flutter converted to normal sinus rhythm.  3.  Murmur.  4.  Chronic lymphocytic leukemia.  5.  Hyperlipidemia.  6.  Prior history of transient ischemic attacks.   MEDICATIONS ON DISCHARGE:  Plavix 75 mg daily, Crestor 20 mg half tablet every other day alternating with 1 tablet every other day, tramadol 50 mg 1 tablet twice a day, aspirin 81 mg daily, Osteo Bi-Flex 250-200 one tablets twice a day, timolol ophthalmic one drop at bedtime, metoprolol 25 mg twice a day, multivitamin 1 tablet daily.   DIET: Regular low sodium diet, regular consistency.   ACTIVITY: Activity as tolerated.   FOLLOW UP:  Follow-up in 1 to 2 weeks with Dr. Lovie Macadamia.   REASON FOR ADMISSION: The patient was admitted 10/16/2012 and discharged 10/17/2012. The patient came in with chest pain. His heart rate was elevated. He had some indigestion.  The pains have been going on for a while. The patient was admitted for an observation for chest pain. A stress test was ordered. He was found to be initially in atrial flutter, which converted to normal sinus rhythm.   LABORATORY AND DIAGNOSTIC DATA DURING THE HOSPITAL COURSE: TSH 2.11. Magnesium 2.2. Troponins were negative. Glucose 96, BUN 18, creatinine 0.9, sodium 142, potassium 4.3, chloride 111, CO2 28, calcium 8.6. White blood cell count 29.5, hemoglobin and hematocrit 15.2 and 47.4, platelet count 171.  Chest x-ray:  No alveolar pneumonia.   The next 2 troponins were negative. Stress test negative Lexus and normal left ventricular function. No evidence of infarction or ischemia noted. Echocardiogram was done prior to discharge, but currently not read yet, results still pending.   HOSPITAL COURSE PER PROBLEM LIST:    PROBLEM #1:  For the  patient's chest pain, could be indigestion. The patient's cardiac enzymes were negative. Stress test was negative. The patient was discharged home in stable condition.   PROBLEM #2:  For the patient's atrial flutter, that converted quickly to normal sinus rhythm. The patient is on metoprolol.  I would continue aspirin in this 81 year old gentleman already on aspirin and Plavix. I did not want to add further blood thinners since the patient converted to normal sinus rhythm quickly. The patient was given intravenous fluids. A TSH in normal range.   PROBLEM #3:  Heart murmur.  An echocardiogram was performed, results still pending at this point.   PROBLEM #4:  History of CLL.  Follow-up with oncology as outpatient.   PROBLEM #5:  Hyperlipidemia on Crestor.   PROBLEM #6:  History of prior transient ischemic attack. The patient is on Plavix and aspirin.   PROBLEM #7:  Glaucoma. He is on timolol eyedrops.  Time spent on discharge: 35 minutes     ____________________________ Tana Conch. Leslye Peer, MD rjw:ct D: 11/17/2012 16:49:41 ET T: 11/18/2012 10:29:32 ET JOB#: [id number]  cc: Tana Conch. Leslye Peer, MD, <Dictator> Youlanda Roys. Lovie Macadamia, MD Marisue Brooklyn MD ELECTRONICALLY SIGNED 11/21/2012 13:17

## 2015-01-10 NOTE — Consult Note (Signed)
PATIENT NAME:  Nicholas Elliott, Nicholas Elliott MR#:  [id number] DATE OF BIRTH:  Mar 27, 1922  DATE OF CONSULTATION:  12/15/2012  REFERRING PHYSICIAN:      Dr. Danise Mina CONSULTING PHYSICIAN:  Corey Skains, MD  REASON FOR CONSULTATION: Atrial fibrillation, coronary artery disease, remote stroke with hyperlipidemia with heart pauses.  CHIEF COMPLAINT: "I've been passed out."   HISTORY OF PRESENT ILLNESS:  This is an 81 year old male with known previous atrial fibrillation for which he has had controlled ventricular rate, coronary artery disease, a recent history of issue of stroke with hyperlipidemia on appropriate medications. The patient has had recent weakness, fatigue, shortness of breath and possible bronchitis and infection for which he has had some mental status changes as well as an episode of weakness and syncope.  At the time he has had syncope, he did not have any chest pain. He did not have any elevation of troponin, CK-MB. He does have an EKG showing atrial fibrillation with nonspecific ST changes and telemetry showing some pauses on admission although he had some rapid ventricular rate. Currently he feels fairly well, with no further episodes of syncope.  REVIEW OF SYSTEMS:  The remainder of the review of systems negative for vision change, ringing in the ear, hearing loss, cough, congestion, heartburn, nausea, vomiting, diarrhea, bloody stools, stomach pain, extremity pain, leg weakness, cramping of the buttocks, known blood clots, headaches, nosebleeds, congestion, trouble swallowing, frequent urination, urination at night, muscle weakness, numbness, anxiety, depression, skin lesions or skin rashes.   PAST MEDICAL HISTORY: 1.  Atrial fibrillation. 2.  Coronary artery disease.  3.  Stroke.  4.  Hyperlipidemia.   FAMILY HISTORY: No family members with early onset of cardiovascular disease or hypertension.   SOCIAL HISTORY: Currently denies alcohol or tobacco use.   ALLERGIES: As listed.    MEDICATIONS: As listed.   PHYSICAL EXAMINATION: VITAL SIGNS: Blood pressure 122/68 bilaterally, heart rate 68 upright, reclining and irregular.  GENERAL: He is a well-appearing elderly male in no acute distress.  HEAD, EYES, EARS, NOSE AND THROAT: No icterus, thyromegaly, ulcers, hemorrhage or xanthelasma.  LUNGS:  Expiratory wheezes and rhonchi with lower lung crackles.  ABDOMEN: Soft, nontender, without hepatosplenomegaly or masses. Abdominal aorta is normal size without bruit.  EXTREMITIES: 2+ radial, femoral, dorsal pedal pulses with no lower extremity edema, cyanosis, clubbing or ulcers.  NEUROLOGIC: He is oriented to time, place and person with normal mood and affect.   ASSESSMENT: An 81 year old male with a syncopal episode, coronary artery disease, stroke, hypertension history with bronchitis, possibly driving current issues of atrial fibrillation with variable rate.   RECOMMENDATIONS: 1.  Avoid beta blocker or any other medications to control her heart rate down, following closely for any worsening sick sinus syndrome and the possibility of pacemaker placement as needed. 2.  Aspirin for further risk reduction in stroke with previous stroke history and would use anticoagulation, if able, for further risk reduction in stroke during atrial fibrillation if no evidence of history of significant bleeding complications. 3.  Echocardiogram for left ventricular systolic dysfunction, valvular heart disease.  4.  Continue conservative and supportive care for issues including lung disease with antibiotics. 5. Continue other antihypertensives for hypertension control with goal of systolic blood pressure below 150 mm.  6.  Further ambulation and further evaluation of true potential sick sinus syndrome and further treatment options as patient recovers from bronchitis.     ____________________________ Corey Skains, MD bjk:ct D: 12/15/2012 14:16:27 ET T: 12/15/2012 14:59:50  ET JOB#:  [id number]  cc: Corey Skains, MD, <Dictator> Corey Skains MD ELECTRONICALLY SIGNED 12/17/2012 8:19

## 2015-01-10 NOTE — Discharge Summary (Signed)
PATIENT NAME:  Nicholas Elliott, Nicholas Elliott MR#:  [id number] DATE OF BIRTH:  03/06/1922  DATE OF ADMISSION:  12/15/2012 DATE OF DISCHARGE:  12/18/2012  CONSULTANTS:  Corey Skains, MD, from cardiology and a speech specialist.   CHIEF COMPLAINT:  Altered mental status, cough, fever, chills at home.   DISCHARGE DIAGNOSES:   1.  Systemic inflammatory response syndrome likely from bronchitis, resolved.  2.  Paroxysmal atrial flutter/fibrillation.  3.  Weakness.  4.  Bradycardia with pauses likely from beta-blocker.  5.  Metabolic encephalopathy, resolved.  6.  History of chronic lymphocytic leukemia.  7.  Glaucoma.  8.  Hyperlipidemia.  9.  History of migraines  10.  History of transient ischemic attack in the past.  11.  Osteoarthritis.  12.  Hearing loss.  13.  History of coronary artery disease.   DISCHARGE MEDICATIONS:   1.  Osteo Bi-Flex 250/200 mg 1 tab 2 times a day.  2.  Aspirin 81 mg daily.  3.  Tramadol 50 mg 2 times a day. 4.  Hardin Negus' Colon Health oral capsule 1 cap once a day.  5.  Arthri/D3/H/complexin tablet 1 tablet 2 times a day.  6.  Crestor 20 mg 1/2 tab every other day alternating with 1 mg tab.  7.  Plavix 75 mg 1 tab once a day at bedtime.  8.  Digoxin 125 mcg 1 tab once a day.  9.  Levaquin 500 mg 1 tab daily until done.   CODE STATUS:  THE PATIENT IS FULL CODE.   He will be getting discharged with home health, PT and R.N.   DIET:  Low sodium.   ACTIVITY:  As tolerated.   FOLLOWUP:  Please follow with PCP and Dr. Nehemiah Massed within 1 to 2 weeks.   SIGNIFICANT LABS AND IMAGING:  Initial LFTs within normal limits. Creatinine is 0.84, sodium 136, potassium 3.8, calcium 7.9. Initial WBC was 18.2, hemoglobin 14.7. Troponin was negative on arrival. Last WBC is 19.9 which is about his usual baseline. Blood cultures and urine cultures on the day of admission on March 28th have been no growth to date. Sputum cultures normal flora in 3 days. Stool comprehensive cultures were  negative for E. coli and Campylobacter. CT of head without contrast showed mucosal thickening in the nasal turbinates, otherwise no acute findings. X-ray of the chest cannot exclude a very mild congestive heart failure picture on the day of admission and repeat x-ray of the chest on the 28th of March showed minimal right lung base atelectasis with volume loss.   HISTORY OF PRESENT ILLNESS AND HOSPITAL COURSE:  For full details of history and physical, please see the dictation by Dr. Laurin Coder on March 27th, but briefly, this is a pleasant 81 year old male with history of CLL, CAD, history of CVA in the past, migraines who came in with fever, confusion and was admitted to the hospitalist service. He had a negative CT of the head for acute abnormalities. The patient was noted to have some possible bronchitis on x-ray of the chest as he has some cough. Sputums were sent to the lab as well as urine and blood cultures. He was also noted to have atrial fibrillation. He was started on metoprolol, but did have brady episodes and pauses. Cardiology was consulted as well. He is known to Dr. Nehemiah Massed. At this point, he has had no further pauses in regards to the atrial flutter. He did convert back to normal sinus rhythm. He did have some tachy episodes. The initial  concern was for a possible sick sinus syndrome as he had multiple episodes of tachycardia which reverted back to normal sinus rhythm and did have brady episodes with pauses; however, after a beta-blocker was suspended, he did not have any brady episodes nor pauses and the beta-blocker was likely the culprit. He was started on digoxin per cardiology and he did convert back to flutter/fibrillation right before discharge, but it was okayed by his cardiologist, Dr. Nehemiah Massed, to be discharged with the digoxin. He is intolerant to beta-blocker. Digoxin could be tried or perhaps a low dose Cardizem short-acting if blood pressure tolerates as an outpatient. In regards to  the cough, it was likely bronchitis and he will be treated with 8 days of Levaquin. He did have leukocytosis, but that is likely secondary to his chronic lymphocytic leukemia. His altered mental status was likely metabolic encephalopathy and resolved. He was also given Lasix for a possible acute diastolic CHF, but I think bronchitis might fit the picture more. At this point, he will be discharged according to his cardiologist and he will follow with him as an outpatient in regards to his fibrillation/flutter.    ____________________________ Vivien Presto, MD sa:si D: 12/19/2012 16:14:00 ET T: 12/19/2012 21:18:27 ET JOB#: [id number]  cc: Corey Skains, MD Vivien Presto, MD, <Dictator> Primary Care Physician   Vivien Presto MD ELECTRONICALLY SIGNED 12/20/2012 16:45

## 2015-01-11 NOTE — Discharge Summary (Signed)
Dates of Admission and Diagnosis:  Date of Admission 22-Nov-2013   Date of Discharge 24-Nov-2013   Admitting Diagnosis b/lpneumonia with sepsis   Final Diagnosis pneumonia and sepsis A fib Leukocytosis- hx of CLL.    Chief Complaint/History of Present Illness an 81 year old Caucasian male patient with history of CLL, TIA, hyperlipidemia, and CAD who presents to the Emergency Room complaining of 2 weeks off on and off weakness, some fever and cough. The patient also noticed that he has had on and off epigastric pain, but no nausea, vomiting, or diarrhea. He felt extremely weak today with fever at home, called EMS and was brought to the Emergency Room. Here he has been found to have a white count of 21,000 with heart rate in the one-teens and temperature of 100.4. Chest x-ray shows bilateral basilar pneumonia and is being admitted to the hospitalist service. The patient's saturations are 90 to 91% on room air and did not desat below 88%.   The patient was last seen in the hospital in March of 2014 for bronchitis with sepsis to which he responded well. During that admission, he did have some mild episodes of bradycardia secondary to rate control medication, presently he is on digoxin. He sees Dr. Nehemiah Massed for his A-fib.   He also mentions that he has been living alone for the past 2 months. He is independent with his activities of daily living, still drives. His wife was at [facility] for 2 months then sent to [facility] rehab.   Allergies:  No Known Allergies:   Hepatic:  05-Mar-15 13:49   Bilirubin, Total 0.9  Alkaline Phosphatase 53 (45-117 NOTE: New Reference Range 08/10/13)  SGPT (ALT) 38  SGOT (AST)  52  Total Protein, Serum 6.6  Albumin, Serum  3.1  TDMs:  05-Mar-15 13:49   Digoxin, Serum 0.4 (Therapeutic range for digoxin in patients with atrial fibrillation: 0.8 - 2.0 ng/mL. In patients with congestive heart failure a therapeutic range of 0.5 - 0.8 ng/mL is suggested as  higher levels are associated with an increased risk of toxicity without clear evidence of enhanced efficacy. Digoxin toxicity is commonly associated with serum levels > 2.0 ng/mL but may occur with lower levels, including those in the therapeutic range. Blood samples should be obtained 6-8 hours after administration to assure a reasonable volume of distribution.)  Routine Micro:  05-Mar-15 13:49   Micro Text Report BLOOD CULTURE   COMMENT                   NO GROWTH AEROBICALLY/ANAEROBICALLY IN 5 DAYS   ANTIBIOTIC                       Culture Comment NO GROWTH AEROBICALLY/ANAEROBICALLY IN 5 DAYS  Result(s) reported on 27 Nov 2013 at 01:00PM.    19:40   Micro Text Report SPUTUM CULTURE   ORGANISM 1                MODERATE GROWTH ESCHERICHIA COLI   COMMENT                   -   GRAM STAIN                FAIR SPECIMEN-70-80% WBC   GRAM STAIN                MANY WHITE BLOOD CELLS   GRAM STAIN  MANY GRAM POSITIVE COCCI IN CLUSTERS   GRAM STAIN                FEW GRAM POSITIVE ROD RARE YEAST   ANTIBIOTIC                    ORG#1     AMPICILLIN                    S         CEFAZOLIN                     S         CEFOXITIN                     S       CEFTAZIDIME                   S         CEFTRIAXONE                   S         CIPROFLOXACIN                 S         GENTAMICIN                    S         IMIPENEM                      S         LEVOFLOXACIN                  S  TRIMETHOPRIM/SULFAMETHOXAZOLE S  Organism Name ESCHERICHIA COLI  Organism Quantity MODERATE GROWTH  Cefazolin Sensitivity S  Ampicillin Sensitivity S  Ceftriaxone Sensitivity S  Ciprofloxacin Sensitivity S  Gentamicin Sensitivity S  Ceftazidime Sensitivity S  Imipenem Sensitivity S  Levofloxacin Sensitivity S  Trimethoprim/Sulfamethoxazole Sensitivty S  Cefoxitin Sensitivity S  Organism 1 MODERATE GROWTH ESCHERICHIA COLI  Culture Comment -  Gram Stain 1 FAIR SPECIMEN-70-80% WBC  Gram  Stain 2 MANY WHITE BLOOD CELLS  Gram Stain 3 MANY GRAM POSITIVE COCCI IN CLUSTERS  Gram Stain 4 FEW GRAM POSITIVE ROD RARE YEAST  Result(s) reported on 25 Nov 2013 at 10:51AM.  Routine Chem:  05-Mar-15 13:49   Glucose, Serum  110  BUN 18  Creatinine (comp) 0.84  Sodium, Serum 138  Potassium, Serum  3.0  Chloride, Serum 106  CO2, Serum 27  Calcium (Total), Serum  8.1  Anion Gap  5  Osmolality (calc) 278  eGFR (African American) >60  eGFR (Non-African American) >60 (eGFR values <55m/min/1.73 m2 may be an indication of chronic kidney disease (CKD). Calculated eGFR is useful in patients with stable renal function. The eGFR calculation will not be reliable in acutely ill patients when serum creatinine is changing rapidly. It is not useful in  patients on dialysis. The eGFR calculation may not be applicable to patients at the low and high extremes of body sizes, pregnant women, and vegetarians.)  B-Type Natriuretic Peptide ([facility])  1578 (Result(s) reported on 22 Nov 2013 at 02:32PM.)  06-Mar-15 05:07   Glucose, Serum 83  BUN 16  Creatinine (comp) 0.82  Sodium, Serum 144  Potassium, Serum 3.7  Chloride, Serum  111  CO2, Serum 28  Calcium (Total), Serum  7.9  Anion Gap  5  Osmolality (calc) 287  eGFR (African American) >60  eGFR (Non-African American) >60 (eGFR values <107m/min/1.73 m2 may be an indication of chronic kidney disease (CKD). Calculated eGFR is useful in patients with stable renal function. The eGFR calculation will not be reliable in acutely ill patients when serum creatinine is changing rapidly. It is not useful in  patients on dialysis. The eGFR calculation may not be applicable to patients at the low and high extremes of body sizes, pregnant women, and vegetarians.)  Cardiac:  05-Mar-15 13:49   Troponin I 0.05 (0.00-0.05 0.05 ng/mL or less: NEGATIVE  Repeat testing in 3-6 hrs  if clinically indicated. >0.05 ng/mL: POTENTIAL  MYOCARDIAL INJURY. Repeat   testing in 3-6 hrs if  clinically indicated. NOTE: An increase or decrease  of 30% or more on serial  testing suggests a  clinically important change)  Routine Coag:  05-Mar-15 13:49   Prothrombin 13.2  INR 1.0 (INR reference interval applies to patients on anticoagulant therapy. A single INR therapeutic range for coumarins is not optimal for all indications; however, the suggested range for most indications is 2.0 - 3.0. Exceptions to the INR Reference Range may include: Prosthetic heart valves, acute myocardial infarction, prevention of myocardial infarction, and combinations of aspirin and anticoagulant. The need for a higher or lower target INR must be assessed individually. Reference: The Pharmacology and Management of the Vitamin K  antagonists: the seventh ACCP Conference on Antithrombotic and Thrombolytic Therapy. CGYIRS.8546Sept:126 (3suppl): [id number] A HCT value >55% may artifactually increase the PT.  In one study,  the increase was an average of 25%. Reference:  "Effect on Routine and Special Coagulation Testing Values of Citrate Anticoagulant Adjustment in Patients with High HCT Values." American Journal of Clinical Pathology 2006;126:400-405.)  Routine Hem:  05-Mar-15 13:49   WBC (CBC)  21.8  RBC (CBC)  4.19  Hemoglobin (CBC) 13.6  Hematocrit (CBC) 41.2  Platelet Count (CBC) 163 (Result(s) reported on 22 Nov 2013 at 02:08PM.)  MCV 98  MCH 32.4  MCHC 33.0  RDW 13.5  06-Mar-15 05:07   WBC (CBC)  33.5  RBC (CBC)  3.79  Hemoglobin (CBC)  12.2  Hematocrit (CBC)  37.5  Platelet Count (CBC)  145 (Result(s) reported on 23 Nov 2013 at 06:36AM.)  MCV 99  MCH 32.3  MCHC 32.7  RDW 13.6  Segmented Neutrophils 17  Lymphocytes 67  Variant Lymphocytes 14  Monocytes 2  Diff Comment 1 RBCs APPEAR NORMAL  Diff Comment 2 NORMAL PLT MORPHOLGY  Result(s) reported on 23 Nov 2013 at 06:36AM.  07-Mar-15 12:58   WBC (CBC)  28.0  RBC (CBC)  3.88  Hemoglobin (CBC)  12.8   Hematocrit (CBC)  37.8  Platelet Count (CBC) 218  MCV 97  MCH 33.0  MCHC 34.0  RDW 13.6  Neutrophil % 19.8  Lymphocyte % 76.2  Monocyte % 3.2  Eosinophil % 0.7  Basophil % 0.1  Neutrophil # 5.5  Lymphocyte #  21.3  Monocyte # 0.9  Eosinophil # 0.2  Basophil # 0.0 (Result(s) reported on 24 Nov 2013 at 01:36PM.)   PERTINENT RADIOLOGY STUDIES: XRay:    05-Mar-15 13:57, Chest PA and Lateral  Chest PA and Lateral   REASON FOR EXAM:    weakness, fever, cough  COMMENTS:   May transport without cardiac monitor    PROCEDURE: DXR - DXR CHEST PA (OR AP) AND LATERAL  - Nov 22 2013  1:57PM     CLINICAL  DATA:  Cough and fever    EXAM:  CHEST  2 VIEW    COMPARISON:  12/15/2012    FINDINGS:  Worsening bibasilar airspace disease which may represent pneumonia  or atelectasis. Negative for heart failure or effusion. Heart size  is normal.     IMPRESSION:  Progression of bibasilar airspace disease which may represent  atelectasis or pneumonia.      Electronically Signed    By: Franchot Gallo M.D.    On: 11/22/2013 14:25         Verified By: Truett Perna, M.D.,   Pertinent Past History:  Pertinent Past History PAST MEDICAL HISTORY:  1.  CLL.  2.  Atypical migraines.  3.  Osteoarthritis.  4.  Hyperlipidemia.  5.  Glaucoma.  6.  Mild hearing loss.  7.  CAD.  8.  TIA.  9.  Atrial fibrillation.  PAST SURGICAL HISTORY:  1.  Throat surgery due to dysphagia.  2.  Lumbosacral spine surgery. 3.  Right shoulder cuff repair.  4.  C7-T1 foraminotomy. 5.  Cataract surgery.  6.  Right inguinal surgical. 7.  Colonoscopy.   SOCIAL HISTORY: The patient does not smoke. No alcohol. No illicit drugs. Lives alone. He is married, but his wife is in rehab at this time.   Hospital Course:  Hospital Course * Pneumonia with sepsis on admisison- treated.   On levaquin oral, much better, awaited sputum cx- have gr neg rods. later confirmed E coli- sensitive to levaquin.    On room  air. rersponding nice to levaquin,  prescription at home.  *  Atrial fibrillation with rapid ventricular rate, secondary to sepsis. The patient digoxin level is 0.4. He has been taking digoxin daily.  aspirin and Plavix. stable.  *  Leukocytosis. Could be secondary to sepsis, but the patient does have CLL and has had white count greater than 20%, looking at his old labs. *  Deep vein thrombosis prophylaxis with Lovenox.   Condition on Discharge Stable   Code Status:  Code Status Full Code   DISCHARGE INSTRUCTIONS HOME MEDS:  Medication Reconciliation: Patient's Home Medications at Discharge:     Medication Instructions  osteo bi-flex 250 mg-200 mg oral tablet  1 tab(s) orally 2 times a day   aspirin 81 mg tablet  1 tab(s) orally once a day (in the morning)   tramadol 50 mg oral tablet  1 tab(s) orally 2 times a day   crestor 20 mg oral tablet  0.5 tab(s) (10 mg) orally every other day (at bedtime) alternating with 1 tablet of Crestor 20 mg   crestor 20 mg tablet  1 tab(s) orally every other day (at bedtime) alternating with 0.5 tablet of Crestor 20 mg   plavix 75 mg tablet  1 tab(s) orally once a day (at bedtime)   centrum silver therapeutic multiple vitamins with minerals oral tablet  1 tab(s) orally once a day (in the morning)   furosemide 20 mg oral tablet  0.5 tab(s) (10 mg) orally once a day (in the morning)   digoxin 125 mcg (0.125 mg) oral tablet  1 tab(s) orally once a day (in the morning)   gabapentin 300 mg oral capsule  2 cap(s) (600 mg) orally once a day (in the morning) and 1 capsule at bedtime   cymbalta 30 mg oral delayed release capsule  1 cap(s) orally once a day (at bedtime)   levaquin 750 mg oral tablet  1 tab(s) orally every 24 hours x 3  days    PRESCRIPTIONS: PRINTED AND PLACED ON CHART  STOP TAKING THE FOLLOWING MEDICATION(S):    phillips colon health - oral capsule: 1 cap(s) orally once a day (in the morning)  Physician's Instructions:  Diet Regular    Diet Consistency Regular Consistency   Activity Limitations As tolerated   Return to Work Not Applicable   Time frame for Follow Up Appointment 1-2 weeks   Other Comments routine follow ups with PMD   Electronic Signatures: Vaughan Basta (MD)  (Signed 11-Mar-15 23:52)  Authored: ADMISSION DATE AND DIAGNOSIS, CHIEF COMPLAINT/HPI, Allergies, PERTINENT LABS, PERTINENT RADIOLOGY STUDIES, PERTINENT PAST HISTORY, HOSPITAL COURSE, [place], PATIENT INSTRUCTIONS   Last Updated: 11-Mar-15 23:52 by Vaughan Basta (MD)

## 2015-01-11 NOTE — Op Note (Signed)
PATIENT NAME:  Nicholas Elliott, Nicholas Elliott MR#:  [id number] DATE OF BIRTH:  [birth year]  DATE OF PROCEDURE:  04/10/2014  PREOPERATIVE DIAGNOSIS: Left carpal tunnel syndrome.   POSTOPERATIVE DIAGNOSIS: Left carpal tunnel syndrome.   OPERATION: Left carpal tunnel release.   SURGEON: Park Breed, M.D.   ANESTHESIA: General LMA.   COMPLICATIONS: None.   DRAINS: None.   ESTIMATED BLOOD LOSS: None, none replaced.  DESCRIPTION OF PROCEDURE: The patient was brought to the operating room where he underwent satisfactory general LMA anesthesia in the supine position. The left arm was prepped and draped in sterile fashion. Esmarch was applied and the tourniquet inflated to 250 mmHg. Tourniquet time was 20 minutes.   A longitudinal incision was made in the palm just to the ulnar side of midline. Dissection was carried out bluntly through subcutaneous tissue under loupe magnification. The distal aspect of the volar carpal ligament was identified and a Kelly clamp passed beneath this to protect the volar carpal ligament. Soft tissues were elevated off the volar carpal ligament as well. A Mini-Blade knife was used to release the ligament distal to proximal. Carpal tunnel scissors were used to complete this proximally. The nerve was pale and compressed into an hourglass configuration beneath the ligament. It was freed up from adhesions with a mosquito clamp. The motor branch was intact. There was some thickened synovium, a portion of which was resected. The roof over the ulnar nerve and artery was released.   The wound was irrigated. The wound was closed with a 4-0 nylon suture.  Marcaine 0.5% was placed in the wound. A dry sterile compression hand dressing with volar splint was applied. The tourniquet was deflated with good return of blood flow to the hand. The patient was awakened and taken to recovery in good condition.    ____________________________ Park Breed, MD hem:lt D: 04/10/2014 08:23:58  ET T: 04/10/2014 10:45:52 ET JOB#: [id number]  cc: Park Breed, MD, <Dictator> Park Breed MD ELECTRONICALLY SIGNED 04/11/2014 20:18

## 2015-01-11 NOTE — H&P (Signed)
PATIENT NAME:  Nicholas Elliott, Nicholas Elliott MR#:  [id number] DATE OF BIRTH:  1921-11-03  DATE OF ADMISSION:  11/22/2013  PRIMARY CARE PHYSICIAN: [name], MD  CHIEF COMPLAINT: Fever, weakness, cough.   HISTORY OF PRESENT ILLNESS: This is an 81 year old Caucasian male patient with history of CLL, TIA, hyperlipidemia, and CAD who presents to the Emergency Room complaining of 2 weeks off on and off weakness, some fever and cough. The patient also noticed that he has had on and off epigastric pain, but no nausea, vomiting, or diarrhea. He felt extremely weak today with fever at home, called EMS and was brought to the Emergency Room. Here he has been found to have a white count of 21,000 with heart rate in the one-teens and temperature of 100.4. Chest x-ray shows bilateral basilar pneumonia and is being admitted to the hospitalist service. The patient's saturations are 90 to 91% on room air and did not desat below 88%.   The patient was last seen in the hospital in March of 2014 for bronchitis with sepsis to which he responded well. During that admission, he did have some mild episodes of bradycardia secondary to rate control medication, presently he is on digoxin. He sees Dr. Nehemiah Massed for his A-fib.   He also mentions that he has been living alone for the past 2 months. He is independent with his activities of daily living, still drives. His wife was at [facility] for 2 months then sent to [facility] rehab.  PAST MEDICAL HISTORY:  1.  CLL.  2.  Atypical migraines.  3.  Osteoarthritis.  4.  Hyperlipidemia.  5.  Glaucoma.  6.  Mild hearing loss.  7.  CAD.  8.  TIA.  9.  Atrial fibrillation.  PAST SURGICAL HISTORY:  1.  Throat surgery due to dysphagia.  2.  Lumbosacral spine surgery. 3.  Right shoulder cuff repair.  4.  C7-T1 foraminotomy. 5.  Cataract surgery.  6.  Right inguinal surgical. 7.  Colonoscopy.   SOCIAL HISTORY: The patient does not smoke. No alcohol. No illicit drugs. Lives alone. He  is married, but his wife is in rehab at this time.   CODE STATUS: FULL code.   ALLERGIES: No known drug allergies.   FAMILY HISTORY: Positive for stroke in his father at age of 50 and hypertension.   HOME MEDICATIONS:  1.  Digoxin 0.125 mg daily.  2.  Aspirin 81 mg daily. 3.  Centrum Silver 1 tablet daily.  4.  Crestor 20 mg daily.  5.  Cymbalta 30 mg daily.  6.  Lasix 20 mg 1/2 tablet once a day. 7.  Gabapentin 300 mg 2 capsules oral once a day and 1 tablet at night.  8.  Plavix 75 mg daily. 9.  Tramadol 50 mg oral 2 times a day. 10.  Osteo Bi-Flex 250 mg oral 2 times a day.   REVIEW OF SYSTEMS: CONSTITUTIONAL: Complains of fever, weakness. EYES: No blurred vision, pain or redness. EARS, NOSE, AND THROAT: No tinnitus, ear pain. Does have hearing loss.  RESPIRATORY: Has cough, gray productive sputum. No wheeze. Has some shortness of breath.  CARDIOVASCULAR: No dyspnea, orthopnea, edema.  GASTROINTESTINAL: Has some epigastric pain. No nausea, vomiting, diarrhea, abdominal pain.  GENITOURINARY: No dysuria, hematuria, frequency.  ENDOCRINE: No polyuria, nocturia, thyroid problems.  HEMATOLOGIC AND LYMPHATIC: No anemia, easy bruising or bleeding. INTEGUMENTARY: No acne, rash, lesions.  MUSCULOSKELETAL: No back pain.  NEUROLOGIC: No focal numbness, weakness, seizures.  PSYCHIATRIC: No anxiety or depression.  PHYSICAL EXAMINATION: VITAL SIGNS: Temperature 100.4, pulse 114, respirations 20, blood pressure 146/65, saturating 98% on room air.  GENERAL: Elderly Caucasian male patient lying in bed, seems to be in mild respiratory distress, coughing.  PSYCHIATRIC: Alert and oriented x3. Mood and affect are appropriate.  HEENT: Atraumatic, normocephalic. Mucosa moist and pink. External ears normal. No pallor or icterus. Pupils are bilateral equal and reactive to light.  NECK: Supple. No thyromegaly. No palpable lymph nodes. Trachea midline. No carotid bruit or JVD.  HEART: S1 and S2.  Tachycardic and irregular without any murmurs. Peripheral pulses 2+. No edema.  LUNGS: Bilateral basal crackles, rhonchi.  ABDOMEN: Soft, nontender. Bowel sounds present. No hepatosplenomegaly palpable.  SKIN: Warm and dry. No petechiae, rash, ulcers.  MUSCULOSKELETAL: No joint swelling, redness, or effusion of the large joints. Normal muscle tone.  NEUROLOGIC: Motor strength 5/5 in upper and lower extremities. Sensation is intact all over.  LYMPHATIC: No cervical lymphadenopathy.   DIAGNOSTIC DATA: Lab studies: Show BNP 1578, glucose 110, BUN 18, creatinine 0.84, sodium 138, potassium 3, chloride 106, bicarb 27, AST, ALT, alkaline phosphatase, AND bilirubin normal. Troponin 0.05. Digoxin 0.4. WBC 21.8, hemoglobin 13.6, and platelets 163,000. INR 1.  EKG shows atrial fibrillation, rate of 114.   Chest x-ray shows bilateral basilar pneumonia, right greater than left.   ASSESSMENT AND PLAN: 1.  Bilateral basilar pneumonia, community-acquired, with sepsis. The patient is tachycardic, fever of 100.4. We will bolus 5 mL of normal saline, continue at 100 normal saline. We will start the patient on Levaquin. Check an influenza A and B. Put him on nebulizers p.r.n., oxygen support if sats go less than 90%. We will also get sputum and blood cultures.  2.  Atrial fibrillation with rapid ventricular rate, secondary to sepsis. The patient digoxin level is 0.4. He has been taking digoxin daily. We will give him an IV dose of digoxin stat at this time to control his heart rate as digoxin level is subtherapeutic. Continue his aspirin and Plavix.  3.  Leukocytosis. Could be secondary to sepsis, but the patient does have CLL and has had white count greater than 20%, looking at his old labs.  4.  Deep vein thrombosis prophylaxis with Lovenox.   CODE STATUS: FULL code.  TIME SPENT TODAY ON THIS CASE: 50 minutes.  ____________________________ Leia Alf Jeromie Gainor, MD srs:sb D: 11/22/2013 15:09:58  ET T: 11/22/2013 16:32:25 ET JOB#: [id number]  cc: Alveta Heimlich R. Aryn Kops, MD, <Dictator> Youlanda Roys. Lovie Macadamia, MD Corey Skains, MD Neita Carp MD ELECTRONICALLY SIGNED 11/22/2013 18:43

## 2015-03-04 ENCOUNTER — Other Ambulatory Visit: Payer: Self-pay | Admitting: *Deleted

## 2015-03-04 ENCOUNTER — Ambulatory Visit: Payer: Self-pay | Admitting: Oncology

## 2015-03-04 ENCOUNTER — Other Ambulatory Visit: Payer: Self-pay

## 2015-03-04 DIAGNOSIS — D72829 Elevated white blood cell count, unspecified: Secondary | ICD-10-CM

## 2015-03-05 ENCOUNTER — Inpatient Hospital Stay: Payer: Medicare PPO | Attending: Oncology

## 2015-03-05 ENCOUNTER — Encounter: Payer: Self-pay | Admitting: Oncology

## 2015-03-05 ENCOUNTER — Inpatient Hospital Stay (HOSPITAL_BASED_OUTPATIENT_CLINIC_OR_DEPARTMENT_OTHER): Payer: Medicare PPO | Admitting: Oncology

## 2015-03-05 VITALS — BP 107/63 | HR 65 | Temp 98.2°F | Resp 18 | Wt 170.6 lb

## 2015-03-05 DIAGNOSIS — C9110 Chronic lymphocytic leukemia of B-cell type not having achieved remission: Secondary | ICD-10-CM | POA: Insufficient documentation

## 2015-03-05 DIAGNOSIS — Z79899 Other long term (current) drug therapy: Secondary | ICD-10-CM | POA: Diagnosis not present

## 2015-03-05 DIAGNOSIS — D72829 Elevated white blood cell count, unspecified: Secondary | ICD-10-CM

## 2015-03-05 LAB — CBC WITH DIFFERENTIAL/PLATELET
Basophils Absolute: 0.1 10*3/uL (ref 0–0.1)
Basophils Relative: 0 %
EOS PCT: 1 %
Eosinophils Absolute: 0.5 10*3/uL (ref 0–0.7)
HCT: 42.6 % (ref 40.0–52.0)
Hemoglobin: 13.5 g/dL (ref 13.0–18.0)
LYMPHS PCT: 86 %
Lymphs Abs: 34.9 10*3/uL — ABNORMAL HIGH (ref 1.0–3.6)
MCH: 31 pg (ref 26.0–34.0)
MCHC: 31.6 g/dL — ABNORMAL LOW (ref 32.0–36.0)
MCV: 98.1 fL (ref 80.0–100.0)
MONO ABS: 0.9 10*3/uL (ref 0.2–1.0)
Monocytes Relative: 2 %
NEUTROS PCT: 11 %
Neutro Abs: 4.5 10*3/uL (ref 1.4–6.5)
Platelets: 136 10*3/uL — ABNORMAL LOW (ref 150–440)
RBC: 4.35 MIL/uL — AB (ref 4.40–5.90)
RDW: 13.7 % (ref 11.5–14.5)
WBC: 40.9 10*3/uL — ABNORMAL HIGH (ref 3.8–10.6)

## 2015-03-24 NOTE — Progress Notes (Signed)
Nicholas Elliott  [phone] [phone]  ID: [name] OB: 03/20/1922  MR#: [id number]  UYQ#:[id number]  Patient Care Team: [name], MD as PCP - General (Family Medicine)  CHIEF COMPLAINT:  Chief Complaint  Patient presents with  . Follow-up    leukocytosis    INTERVAL HISTORY: Patient returns to clinic today for laboratory work and routine evaluation.  He continues to feel well and remains asymptomatic.  He denies any fevers, chills, or night sweats.  He denies any weight loss. He denies any chest pain or shortness of breath.  He has a good appetite and denies any nausea, vomiting, constipation, or diarrhea.  He has had no recent fevers or illnesses.  He has no urinary complaints.  Patient offers no specific complaints today.  REVIEW OF SYSTEMS:   Review of Systems  Constitutional: Negative for fever, chills and weight loss.  Musculoskeletal: Negative.   Endo/Heme/Allergies: Does not bruise/bleed easily.    As per HPI. Otherwise, a complete review of systems is negatve.  PAST MEDICAL HISTORY: Past Medical History  Diagnosis Date  . High cholesterol   . TIA (transient ischemic attack)   . Arthritis   . Glaucoma   . Migraine     PAST SURGICAL HISTORY: Past Surgical History  Procedure Laterality Date  . Upper palate reparin    . Spine surgery      FAMILY HISTORY Family History  Problem Relation Age of Onset  . Stroke Father   . Cancer Sister   . Heart attack Brother        ADVANCED DIRECTIVES:    HEALTH MAINTENANCE: History  Substance Use Topics  . Smoking status: Not on file  . Smokeless tobacco: Not on file  . Alcohol Use: Not on file     Colonoscopy:  PAP:  Bone density:  Lipid panel:  Not on File  Current Outpatient Prescriptions  Medication Sig Dispense Refill  . aspirin 81 MG tablet Take 81 mg by mouth daily.    . clopidogrel (PLAVIX) 75 MG tablet Take 75 mg by mouth daily.    . digoxin  (LANOXIN) 0.125 MG tablet Take by mouth daily.    . furosemide (LASIX) 20 MG tablet Take 20 mg by mouth. 0.5 tablet once a day in the morning    . gabapentin (NEURONTIN) 300 MG capsule Take 300 mg by mouth 3 (three) times daily. 2 capsules once a day in the morning and 1 capsule at bedtime    . HYDROcodone-acetaminophen (NORCO/VICODIN) 5-325 MG per tablet Take 1 tablet by mouth every 6 (six) hours as needed for moderate pain.    . Misc Natural Products (OSTEO BI-FLEX JOINT SHIELD) TABS Take 1 tablet by mouth 2 (two) times daily.    . Multiple Vitamins-Minerals (CENTRUM SILVER ADULT 50+ PO) Take by mouth daily.    . Probiotic Product (PHILLIPS COLON HEALTH PO) Take 1 capsule by mouth daily.    . rosuvastatin (CRESTOR) 20 MG tablet Take 20 mg by mouth daily.    . simethicone (PHAZYME) 125 MG chewable tablet Chew 125 mg by mouth 2 (two) times daily.    . traMADol (ULTRAM) 50 MG tablet Take by mouth 2 (two) times daily.    . vitamin B-12 (CYANOCOBALAMIN) 100 MCG tablet Take 100 mcg by mouth daily.     No current facility-administered medications for this visit.    OBJECTIVE: Filed Vitals:   03/05/15 1437  BP: 107/63  Pulse: 65  Temp: 98.2 F (36.8  C)  Resp: 18     There is no height on file to calculate BMI.    ECOG FS:0 - Asymptomatic  General: Well-developed, well-nourished, no acute distress. Eyes: anicteric sclera. Lungs: Clear to auscultation bilaterally. Heart: Regular rate and rhythm. No rubs, murmurs, or gallops. Abdomen: Soft, nontender, nondistended. No organomegaly noted, normoactive bowel sounds. Musculoskeletal: No edema, cyanosis, or clubbing. Neuro: Alert, answering all questions appropriately. Cranial nerves grossly intact. Skin: No rashes or petechiae noted. Psych: Normal affect. Lymphatics: No cervical, calvicular, axillary or inguinal LAD.   LAB RESULTS:  Lab Results  Component Value Date   NA 144 11/23/2013   K 4.1 04/08/2014   CL 111* 11/23/2013   CO2 28  11/23/2013   GLUCOSE 83 11/23/2013   BUN 16 11/23/2013   CREATININE 0.82 11/23/2013   CALCIUM 7.9* 11/23/2013   PROT 6.6 11/22/2013   ALBUMIN 3.1* 11/22/2013   AST 52* 11/22/2013   ALT 38 11/22/2013   ALKPHOS 53 11/22/2013   GFRNONAA >60 11/23/2013   GFRAA >60 11/23/2013    Lab Results  Component Value Date   WBC 40.9* 03/05/2015   NEUTROABS 4.5 03/05/2015   HGB 13.5 03/05/2015   HCT 42.6 03/05/2015   MCV 98.1 03/05/2015   PLT 136* 03/05/2015     STUDIES: No results found.  ASSESSMENT: Rai stage 0 CLL, Zap 70 negative.  PLAN:    1.  CLL: Confirmed by flow cytometry.  Patient's white count continues to remain elevated, but stable and relatively unchanged.  Currently, no intervention is required.  Patient does not require treatment at this time.  Previously, CT of the chest, abdomen and pelvis only revealed mild splenomegaly.  This does not need to be repeated unless there is suspicion of progression. Patient will return to clinic in 3 months for laboratory work and then in 6 months for laboratory work and further evaluation.   Patient expressed understanding and was in agreement with this plan. He also understands that He can call clinic at any time with any questions, concerns, or complaints.    Lloyd Huger, MD   03/24/2015 9:02 AM

## 2015-04-02 ENCOUNTER — Encounter (HOSPITAL_COMMUNITY): Payer: Self-pay | Admitting: Emergency Medicine

## 2015-04-02 ENCOUNTER — Inpatient Hospital Stay (HOSPITAL_COMMUNITY)
Admission: EM | Admit: 2015-04-02 | Discharge: 2015-04-08 | DRG: 065 | Disposition: A | Discharge status: Skilled Nursing Facility | Payer: Medicare PPO | Attending: Internal Medicine | Admitting: Internal Medicine

## 2015-04-02 ENCOUNTER — Emergency Department (HOSPITAL_COMMUNITY): Payer: Medicare PPO

## 2015-04-02 ENCOUNTER — Inpatient Hospital Stay (HOSPITAL_COMMUNITY): Payer: Medicare PPO

## 2015-04-02 DIAGNOSIS — R29810 Facial weakness: Secondary | ICD-10-CM

## 2015-04-02 DIAGNOSIS — Z8673 Personal history of transient ischemic attack (TIA), and cerebral infarction without residual deficits: Secondary | ICD-10-CM | POA: Diagnosis not present

## 2015-04-02 DIAGNOSIS — R471 Dysarthria and anarthria: Secondary | ICD-10-CM | POA: Diagnosis present

## 2015-04-02 DIAGNOSIS — R531 Weakness: Secondary | ICD-10-CM

## 2015-04-02 DIAGNOSIS — Z79899 Other long term (current) drug therapy: Secondary | ICD-10-CM

## 2015-04-02 DIAGNOSIS — M25552 Pain in left hip: Secondary | ICD-10-CM

## 2015-04-02 DIAGNOSIS — Z7902 Long term (current) use of antithrombotics/antiplatelets: Secondary | ICD-10-CM

## 2015-04-02 DIAGNOSIS — Z6823 Body mass index (BMI) 23.0-23.9, adult: Secondary | ICD-10-CM | POA: Diagnosis not present

## 2015-04-02 DIAGNOSIS — R64 Cachexia: Secondary | ICD-10-CM | POA: Diagnosis present

## 2015-04-02 DIAGNOSIS — S0512XA Contusion of eyeball and orbital tissues, left eye, initial encounter: Secondary | ICD-10-CM | POA: Diagnosis present

## 2015-04-02 DIAGNOSIS — S0083XA Contusion of other part of head, initial encounter: Secondary | ICD-10-CM | POA: Diagnosis not present

## 2015-04-02 DIAGNOSIS — E785 Hyperlipidemia, unspecified: Secondary | ICD-10-CM | POA: Diagnosis present

## 2015-04-02 DIAGNOSIS — S5002XA Contusion of left elbow, initial encounter: Secondary | ICD-10-CM | POA: Diagnosis present

## 2015-04-02 DIAGNOSIS — R54 Age-related physical debility: Secondary | ICD-10-CM | POA: Diagnosis present

## 2015-04-02 DIAGNOSIS — I350 Nonrheumatic aortic (valve) stenosis: Secondary | ICD-10-CM | POA: Diagnosis present

## 2015-04-02 DIAGNOSIS — G8194 Hemiplegia, unspecified affecting left nondominant side: Secondary | ICD-10-CM | POA: Diagnosis present

## 2015-04-02 DIAGNOSIS — Z66 Do not resuscitate: Secondary | ICD-10-CM | POA: Diagnosis not present

## 2015-04-02 DIAGNOSIS — Z79891 Long term (current) use of opiate analgesic: Secondary | ICD-10-CM

## 2015-04-02 DIAGNOSIS — Z7982 Long term (current) use of aspirin: Secondary | ICD-10-CM | POA: Diagnosis not present

## 2015-04-02 DIAGNOSIS — W19XXXA Unspecified fall, initial encounter: Secondary | ICD-10-CM | POA: Diagnosis present

## 2015-04-02 DIAGNOSIS — R079 Chest pain, unspecified: Secondary | ICD-10-CM | POA: Diagnosis not present

## 2015-04-02 DIAGNOSIS — I639 Cerebral infarction, unspecified: Secondary | ICD-10-CM

## 2015-04-02 DIAGNOSIS — E7800 Pure hypercholesterolemia, unspecified: Secondary | ICD-10-CM

## 2015-04-02 DIAGNOSIS — Z823 Family history of stroke: Secondary | ICD-10-CM | POA: Diagnosis not present

## 2015-04-02 DIAGNOSIS — C9110 Chronic lymphocytic leukemia of B-cell type not having achieved remission: Secondary | ICD-10-CM

## 2015-04-02 DIAGNOSIS — M1990 Unspecified osteoarthritis, unspecified site: Secondary | ICD-10-CM

## 2015-04-02 DIAGNOSIS — R4781 Slurred speech: Secondary | ICD-10-CM | POA: Diagnosis present

## 2015-04-02 DIAGNOSIS — H409 Unspecified glaucoma: Secondary | ICD-10-CM | POA: Diagnosis present

## 2015-04-02 DIAGNOSIS — I63411 Cerebral infarction due to embolism of right middle cerebral artery: Secondary | ICD-10-CM | POA: Diagnosis present

## 2015-04-02 DIAGNOSIS — R739 Hyperglycemia, unspecified: Secondary | ICD-10-CM | POA: Diagnosis present

## 2015-04-02 DIAGNOSIS — I10 Essential (primary) hypertension: Secondary | ICD-10-CM | POA: Diagnosis present

## 2015-04-02 DIAGNOSIS — G43909 Migraine, unspecified, not intractable, without status migrainosus: Secondary | ICD-10-CM | POA: Diagnosis present

## 2015-04-02 DIAGNOSIS — G459 Transient cerebral ischemic attack, unspecified: Secondary | ICD-10-CM

## 2015-04-02 HISTORY — DX: Chronic lymphocytic leukemia of B-cell type not having achieved remission: C91.10

## 2015-04-02 HISTORY — DX: Facial weakness: R29.810

## 2015-04-02 HISTORY — DX: Cerebral infarction, unspecified: I63.9

## 2015-04-02 LAB — RAPID URINE DRUG SCREEN, HOSP PERFORMED
AMPHETAMINES: NOT DETECTED
BARBITURATES: NOT DETECTED
Benzodiazepines: NOT DETECTED
COCAINE: NOT DETECTED
OPIATES: NOT DETECTED
TETRAHYDROCANNABINOL: NOT DETECTED

## 2015-04-02 LAB — COMPREHENSIVE METABOLIC PANEL
ALT: 22 U/L (ref 17–63)
AST: 44 U/L — AB (ref 15–41)
Albumin: 3.9 g/dL (ref 3.5–5.0)
Alkaline Phosphatase: 48 U/L (ref 38–126)
Anion gap: 9 (ref 5–15)
BILIRUBIN TOTAL: 0.6 mg/dL (ref 0.3–1.2)
BUN: 21 mg/dL — ABNORMAL HIGH (ref 6–20)
CALCIUM: 8.9 mg/dL (ref 8.9–10.3)
CO2: 24 mmol/L (ref 22–32)
Chloride: 107 mmol/L (ref 101–111)
Creatinine, Ser: 0.97 mg/dL (ref 0.61–1.24)
GFR calc Af Amer: >60 mL/min (ref >60)
GFR calc non Af Amer: >60 mL/min (ref >60)
Glucose, Bld: 209 mg/dL — ABNORMAL HIGH (ref 65–99)
Potassium: 4.2 mmol/L (ref 3.5–5.1)
Sodium: 140 mmol/L (ref 135–145)
Total Protein: 6.2 g/dL — ABNORMAL LOW (ref 6.5–8.1)

## 2015-04-02 LAB — URINALYSIS, ROUTINE W REFLEX MICROSCOPIC
BILIRUBIN URINE: NEGATIVE
Glucose, UA: 250 mg/dL — AB
Ketones, ur: 15 mg/dL — AB
LEUKOCYTES UA: NEGATIVE
NITRITE: NEGATIVE
PROTEIN: NEGATIVE mg/dL
SPECIFIC GRAVITY, URINE: 1.023 (ref 1.005–1.030)
UROBILINOGEN UA: 0.2 mg/dL (ref 0.0–1.0)
pH: 5.5 (ref 5.0–8.0)

## 2015-04-02 LAB — I-STAT CHEM 8, ED
BUN: 25 mg/dL — AB (ref 6–20)
Calcium, Ion: 1.12 mmol/L — ABNORMAL LOW (ref 1.13–1.30)
Chloride: 106 mmol/L (ref 101–111)
Creatinine, Ser: 0.8 mg/dL (ref 0.61–1.24)
Glucose, Bld: 203 mg/dL — ABNORMAL HIGH (ref 65–99)
HEMATOCRIT: 48 % (ref 39.0–52.0)
Hemoglobin: 16.3 g/dL (ref 13.0–17.0)
POTASSIUM: 4 mmol/L (ref 3.5–5.1)
Sodium: 143 mmol/L (ref 135–145)
TCO2: 23 mmol/L (ref 0–100)

## 2015-04-02 LAB — APTT: aPTT: 23 seconds — ABNORMAL LOW (ref 24–37)

## 2015-04-02 LAB — CBC
HEMATOCRIT: 43.9 % (ref 39.0–52.0)
HEMOGLOBIN: 14.4 g/dL (ref 13.0–17.0)
MCH: 32.1 pg (ref 26.0–34.0)
MCHC: 32.8 g/dL (ref 30.0–36.0)
MCV: 98 fL (ref 78.0–100.0)
PLATELETS: 126 10*3/uL — AB (ref 150–400)
RBC: 4.48 MIL/uL (ref 4.22–5.81)
RDW: 13.6 % (ref 11.5–15.5)
WBC: 33.7 10*3/uL — AB (ref 4.0–10.5)

## 2015-04-02 LAB — DIFFERENTIAL
BASOS PCT: 0 % (ref 0–1)
Basophils Absolute: 0 10*3/uL (ref 0.0–0.1)
EOS ABS: 0 10*3/uL (ref 0.0–0.7)
EOS PCT: 0 % (ref 0–5)
LYMPHS ABS: 21.6 10*3/uL — AB (ref 0.7–4.0)
LYMPHS PCT: 64 % — AB (ref 12–46)
MONO ABS: 1.3 10*3/uL — AB (ref 0.1–1.0)
MONOS PCT: 4 % (ref 3–12)
Neutro Abs: 10.8 10*3/uL — ABNORMAL HIGH (ref 1.7–7.7)
Neutrophils Relative %: 32 % — ABNORMAL LOW (ref 43–77)

## 2015-04-02 LAB — PROTIME-INR
INR: 1.07 (ref 0.00–1.49)
PROTHROMBIN TIME: 14.1 s (ref 11.6–15.2)

## 2015-04-02 LAB — CBG MONITORING, ED: GLUCOSE-CAPILLARY: 190 mg/dL — AB (ref 65–99)

## 2015-04-02 LAB — I-STAT TROPONIN, ED: Troponin i, poc: 0.04 ng/mL (ref 0.00–0.08)

## 2015-04-02 LAB — URINE MICROSCOPIC-ADD ON

## 2015-04-02 LAB — DIGOXIN LEVEL: Digoxin Level: 0.4 ng/mL — ABNORMAL LOW (ref 0.8–2.0)

## 2015-04-02 MED ORDER — HEPARIN SODIUM (PORCINE) 5000 UNIT/ML IJ SOLN
5000.0000 [IU] | Freq: Three times a day (TID) | INTRAMUSCULAR | Status: DC
  Administered 2015-04-03 – 2015-04-08 (×16): 5000 [IU] via SUBCUTANEOUS
  Filled 2015-04-02 (×15): qty 1

## 2015-04-02 MED ORDER — ADULT MULTIVITAMIN W/MINERALS CH
1.0000 | ORAL_TABLET | Freq: Every day | ORAL | Status: DC
  Administered 2015-04-03 – 2015-04-08 (×6): 1 via ORAL
  Filled 2015-04-02 (×6): qty 1

## 2015-04-02 MED ORDER — HYDROCODONE-ACETAMINOPHEN 5-325 MG PO TABS
1.0000 | ORAL_TABLET | Freq: Four times a day (QID) | ORAL | Status: DC | PRN
  Administered 2015-04-03 – 2015-04-07 (×8): 1 via ORAL
  Filled 2015-04-02 (×9): qty 1

## 2015-04-02 MED ORDER — STROKE: EARLY STAGES OF RECOVERY BOOK
Freq: Once | Status: AC
  Administered 2015-04-02

## 2015-04-02 MED ORDER — CLOPIDOGREL BISULFATE 75 MG PO TABS
75.0000 mg | ORAL_TABLET | Freq: Every day | ORAL | Status: DC
  Administered 2015-04-03 – 2015-04-08 (×6): 75 mg via ORAL
  Filled 2015-04-02 (×6): qty 1

## 2015-04-02 MED ORDER — ROSUVASTATIN CALCIUM 20 MG PO TABS
20.0000 mg | ORAL_TABLET | Freq: Every day | ORAL | Status: DC
Start: 2015-04-03 — End: 2015-04-08
  Administered 2015-04-03 – 2015-04-07 (×5): 20 mg via ORAL
  Filled 2015-04-02 (×6): qty 1

## 2015-04-02 MED ORDER — DIGOXIN 125 MCG PO TABS
0.1250 mg | ORAL_TABLET | Freq: Every day | ORAL | Status: DC
  Administered 2015-04-03 – 2015-04-08 (×6): 0.125 mg via ORAL
  Filled 2015-04-02 (×6): qty 1

## 2015-04-02 MED ORDER — SIMETHICONE 80 MG PO CHEW: 80.0000 mg | CHEWABLE_TABLET | Freq: Four times a day (QID) | ORAL | Status: DC | PRN

## 2015-04-02 MED ORDER — OSTEO BI-FLEX JOINT SHIELD PO TABS: 1.0000 | ORAL_TABLET | Freq: Two times a day (BID) | ORAL | Status: DC

## 2015-04-02 MED ORDER — GABAPENTIN 300 MG PO CAPS
300.0000 mg | ORAL_CAPSULE | Freq: Three times a day (TID) | ORAL | Status: DC
  Administered 2015-04-03 – 2015-04-08 (×16): 300 mg via ORAL
  Filled 2015-04-02 (×17): qty 1

## 2015-04-02 MED ORDER — RISAQUAD PO CAPS
1.0000 | ORAL_CAPSULE | Freq: Every day | ORAL | Status: DC
  Administered 2015-04-03 – 2015-04-08 (×6): 1 via ORAL
  Filled 2015-04-02 (×6): qty 1

## 2015-04-02 MED ORDER — VITAMIN B-12 100 MCG PO TABS
100.0000 ug | ORAL_TABLET | Freq: Every day | ORAL | Status: DC
  Administered 2015-04-03 – 2015-04-08 (×6): 100 ug via ORAL
  Filled 2015-04-02 (×6): qty 1

## 2015-04-02 MED ORDER — ASPIRIN EC 81 MG PO TBEC
81.0000 mg | DELAYED_RELEASE_TABLET | Freq: Every day | ORAL | Status: DC
  Administered 2015-04-03 – 2015-04-08 (×6): 81 mg via ORAL
  Filled 2015-04-02 (×6): qty 1

## 2015-04-02 MED ORDER — SENNOSIDES-DOCUSATE SODIUM 8.6-50 MG PO TABS: 1.0000 | ORAL_TABLET | Freq: Every evening | ORAL | Status: DC | PRN

## 2015-04-02 NOTE — ED Notes (Signed)
Patient traveling to CT 2 with Janett Billow, RN.

## 2015-04-02 NOTE — Progress Notes (Signed)
PHARMACIST - PHYSICIAN ORDER COMMUNICATION ° °CONCERNING: [employer] Medication Policy on Herbal Medications ° °DESCRIPTION:  This patient’s order for:  Osteo-Bi Flex  has been noted. ° °This product(s) is classified as an “herbal” or natural product. °Due to a lack of definitive safety studies or FDA approval, nonstandard manufacturing practices, plus the potential risk of unknown drug-drug interactions while on inpatient medications, the Pharmacy and Therapeutics Committee does not permit the use of “herbal” or natural products of this type within [facility]. °  °ACTION TAKEN: °The pharmacy department is unable to verify this order at this time and your patient has been informed of this safety policy. °Please reevaluate patient’s clinical condition at discharge and address if the herbal or natural product(s) should be resumed at that time. ° °

## 2015-04-02 NOTE — ED Notes (Signed)
Lattie Haw, PA-C, at the bedside to see the patient. cbg of 190 on arrival.

## 2015-04-02 NOTE — ED Provider Notes (Signed)
CSN: [id number]     Arrival date & time 04/02/15  1937 History   First MD Initiated Contact with Patient 04/02/15 1937     Chief Complaint  Patient presents with  . Cerebrovascular Accident     (Consider location/radiation/quality/duration/timing/severity/associated sxs/prior Treatment) The history is provided by the patient and medical records.    This is a 81 year old male with past medical history significant for migraines, glaucoma, arthritis, TIAs, hyperlipidemia, CLL followed by oncology presenting to the ED for stroke like symptoms.  History limited due to patient's condition.  Patient was last known well at 0900 this morning after he talked to his daughter.  She called him again later this afternoon and when he did not answer she became concerned and went to his house where she found him lying in the kitchen floor.  Patient states he remembers falling but unsure why he fell.  Hx of TIA's but no history of stroke.  Patient does take ASA and plavix.  Patient has bruising and swelling around left eye and swelling of left wrist.  c-collar in place on arrival.  VSS.  Past Medical History  Diagnosis Date  . High cholesterol   . TIA (transient ischemic attack)   . Arthritis   . Glaucoma   . Migraine    Past Surgical History  Procedure Laterality Date  . Upper palate reparin    . Spine surgery     Family History  Problem Relation Age of Onset  . Stroke Father   . Cancer Sister   . Heart attack Brother    History  Substance Use Topics  . Smoking status: Not on file  . Smokeless tobacco: Not on file  . Alcohol Use: Not on file    Review of Systems  Neurological: Positive for speech difficulty and weakness.  All other systems reviewed and are negative.     Allergies  Review of patient's allergies indicates not on file.  Home Medications   Prior to Admission medications   Medication Sig Start Date End Date Taking? Authorizing Provider  aspirin 81 MG tablet Take 81 mg  by mouth daily.    Historical Provider, MD  clopidogrel (PLAVIX) 75 MG tablet Take 75 mg by mouth daily.    Historical Provider, MD  digoxin (LANOXIN) 0.125 MG tablet Take by mouth daily.    Historical Provider, MD  furosemide (LASIX) 20 MG tablet Take 20 mg by mouth. 0.5 tablet once a day in the morning    Historical Provider, MD  gabapentin (NEURONTIN) 300 MG capsule Take 300 mg by mouth 3 (three) times daily. 2 capsules once a day in the morning and 1 capsule at bedtime    Historical Provider, MD  HYDROcodone-acetaminophen (NORCO/VICODIN) 5-325 MG per tablet Take 1 tablet by mouth every 6 (six) hours as needed for moderate pain.    Historical Provider, MD  Misc Natural Products (OSTEO BI-FLEX JOINT SHIELD) TABS Take 1 tablet by mouth 2 (two) times daily.    Historical Provider, MD  Multiple Vitamins-Minerals (CENTRUM SILVER ADULT 50+ PO) Take by mouth daily.    Historical Provider, MD  Probiotic Product ([place]) Take 1 capsule by mouth daily.    Historical Provider, MD  rosuvastatin (CRESTOR) 20 MG tablet Take 20 mg by mouth daily.    Historical Provider, MD  simethicone (PHAZYME) 125 MG chewable tablet Chew 125 mg by mouth 2 (two) times daily.    Historical Provider, MD  traMADol (ULTRAM) 50 MG tablet Take by  mouth 2 (two) times daily.    Historical Provider, MD  vitamin B-12 (CYANOCOBALAMIN) 100 MCG tablet Take 100 mcg by mouth daily.    Historical Provider, MD   BP 134/69 mmHg  Pulse 93  Temp(Src) 98.3 F (36.8 C) (Oral)  Resp 23  Ht 5\' 10"  (1.778 m)  Wt 170 lb (77.111 kg)  BMI 24.39 kg/m2  SpO2 97%   Physical Exam  Constitutional: He appears well-developed and well-nourished. No distress.  HENT:  Head: Normocephalic and atraumatic.  Right Ear: Tympanic membrane and ear canal normal.  Left Ear: Tympanic membrane and ear canal normal.  Nose: Nose normal.  Mouth/Throat: Uvula is midline, oropharynx is clear and moist and mucous membranes are normal. No  oropharyngeal exudate, posterior oropharyngeal edema, posterior oropharyngeal erythema or tonsillar abscesses.  Swelling and bruising surrounding left eye; nose non- tender; midface stable; poor dentition but intact  Eyes: Conjunctivae and EOM are normal. Pupils are equal, round, and reactive to light.  Neck:  c-collar in place  Cardiovascular: Normal rate, regular rhythm and normal heart sounds.   Pulmonary/Chest: Effort normal and breath sounds normal. No respiratory distress. He has no wheezes.  Abdominal: Soft. Bowel sounds are normal.  Musculoskeletal: Normal range of motion.  Neurological: He is alert.  Awake, alert; oriented to self and place, confusion regarding time; no sensation noted to distal left arm from elbow to hand, normal sensation in left upper arm and entire right arm; no sensation of left thigh, normal sensation of lower leg and entire right leg; able to lift all 4 extremities against gravity but cannot grip with left hand; left facial droop noted with slurred speech; gait not tested  Skin: Skin is warm and dry. He is not diaphoretic.  Psychiatric: He has a normal mood and affect.  Nursing note and vitals reviewed.   ED Course  Procedures (including critical care time) Labs Review Labs Reviewed  APTT - Abnormal; Notable for the following:    aPTT 23 (*)    All other components within normal limits  CBC - Abnormal; Notable for the following:    WBC 33.7 (*)    Platelets 126 (*)    All other components within normal limits  DIFFERENTIAL - Abnormal; Notable for the following:    Neutrophils Relative % 32 (*)    Lymphocytes Relative 64 (*)    Neutro Abs 10.8 (*)    Lymphs Abs 21.6 (*)    Monocytes Absolute 1.3 (*)    All other components within normal limits  COMPREHENSIVE METABOLIC PANEL - Abnormal; Notable for the following:    Glucose, Bld 209 (*)    BUN 21 (*)    Total Protein 6.2 (*)    AST 44 (*)    All other components within normal limits  URINALYSIS,  ROUTINE W REFLEX MICROSCOPIC (NOT AT [facility]) - Abnormal; Notable for the following:    Glucose, UA 250 (*)    Hgb urine dipstick SMALL (*)    Ketones, ur 15 (*)    All other components within normal limits  URINE MICROSCOPIC-ADD ON - Abnormal; Notable for the following:    Squamous Epithelial / LPF FEW (*)    Bacteria, UA FEW (*)    All other components within normal limits  I-STAT CHEM 8, ED - Abnormal; Notable for the following:    BUN 25 (*)    Glucose, Bld 203 (*)    Calcium, Ion 1.12 (*)    All other components within normal limits  CBG MONITORING, ED - Abnormal; Notable for the following:    Glucose-Capillary 190 (*)    All other components within normal limits  PROTIME-INR  URINE RAPID DRUG SCREEN, HOSP PERFORMED  ETHANOL  DIGOXIN LEVEL  I-STAT TROPOININ, ED    Imaging Review Dg Wrist Complete Left  04/02/2015   CLINICAL DATA:  Recent fall with left wrist pain, initial encounter  EXAM: LEFT WRIST - COMPLETE 3+ VIEW  COMPARISON:  None.  FINDINGS: Degenerative changes of the first [facility] joint are seen. Mild osteopenia is noted. No acute fracture or dislocation is noted.  IMPRESSION: No acute abnormality noted.   Electronically Signed   By: Inez Catalina M.D.   On: 04/02/2015 20:47   Ct Head Wo Contrast  04/02/2015   CLINICAL DATA:  Acute onset slurred speech, left-sided weakness, and left-sided facial droop. Fall onto floor. Head and neck injury. Facial pain and swelling. Initial encounter.  EXAM: CT HEAD WITHOUT CONTRAST  CT MAXILLOFACIAL WITHOUT CONTRAST  CT CERVICAL SPINE WITHOUT CONTRAST  TECHNIQUE: Multidetector CT imaging of the head, cervical spine, and maxillofacial structures were performed using the standard protocol without intravenous contrast. Multiplanar CT image reconstructions of the cervical spine and maxillofacial structures were also generated.  COMPARISON:  Head CT on 12/14/2012  FINDINGS: CT HEAD FINDINGS  There is no evidence of intracranial hemorrhage, brain edema,  or other signs of acute infarction. There is no evidence of intracranial mass lesion or mass effect. No abnormal extraaxial fluid collections are identified.  Mild cerebral atrophy and associated ventriculomegaly are stable. Mild chronic small vessel disease again demonstrated. Mild frontal scalp soft tissue swelling is noted. No evidence of skull fracture or pneumocephalus.  CT MAXILLOFACIAL FINDINGS  Left supraorbital and preseptal soft tissue swelling is seen. Globes and other intraorbital anatomy are normal in appearance.  No evidence of orbital or facial bone fracture. No evidence of orbital emphysema or sinus air-fluid levels.  CT CERVICAL SPINE FINDINGS  No evidence of acute fracture, subluxation, or prevertebral soft tissue swelling.  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  Moderate degenerative disc disease demonstrated at levels of C4-5, C6-7, and C7-T1. Mild to moderate bilateral facet DJD also noted. Advanced atlantoaxial degenerative changes also seen as well as mild cervical kyphoscoliosis.  IMPRESSION: Frontal scalp soft tissue swelling. No evidence of skull fracture or acute intracranial abnormality. Stable mild cerebral atrophy and chronic small vessel disease.  Left supraorbital and preseptal soft tissue swelling. No evidence of orbital or facial bone flexure.  No evidence of acute cervical spine fracture or subluxation. Degenerative spondylosis and congenital fusions, as described above.   Electronically Signed   By: Earle Gell M.D.   On: 04/02/2015 20:23   Ct Cervical Spine Wo Contrast  04/02/2015   CLINICAL DATA:  Acute onset slurred speech, left-sided weakness, and left-sided facial droop. Fall onto floor. Head and neck injury. Facial pain and swelling. Initial encounter.  EXAM: CT HEAD WITHOUT CONTRAST  CT MAXILLOFACIAL WITHOUT CONTRAST  CT CERVICAL SPINE WITHOUT CONTRAST  TECHNIQUE: Multidetector CT  imaging of the head, cervical spine, and maxillofacial structures were performed using the standard protocol without intravenous contrast. Multiplanar CT image reconstructions of the cervical spine and maxillofacial structures were also generated.  COMPARISON:  Head CT on 12/14/2012  FINDINGS: CT HEAD FINDINGS  There is no evidence of intracranial hemorrhage, brain edema, or other signs of acute infarction. There  is no evidence of intracranial mass lesion or mass effect. No abnormal extraaxial fluid collections are identified.  Mild cerebral atrophy and associated ventriculomegaly are stable. Mild chronic small vessel disease again demonstrated. Mild frontal scalp soft tissue swelling is noted. No evidence of skull fracture or pneumocephalus.  CT MAXILLOFACIAL FINDINGS  Left supraorbital and preseptal soft tissue swelling is seen. Globes and other intraorbital anatomy are normal in appearance.  No evidence of orbital or facial bone fracture. No evidence of orbital emphysema or sinus air-fluid levels.  CT CERVICAL SPINE FINDINGS  No evidence of acute fracture, subluxation, or prevertebral soft tissue swelling.  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  Moderate degenerative disc disease demonstrated at levels of C4-5, C6-7, and C7-T1. Mild to moderate bilateral facet DJD also noted. Advanced atlantoaxial degenerative changes also seen as well as mild cervical kyphoscoliosis.  IMPRESSION: Frontal scalp soft tissue swelling. No evidence of skull fracture or acute intracranial abnormality. Stable mild cerebral atrophy and chronic small vessel disease.  Left supraorbital and preseptal soft tissue swelling. No evidence of orbital or facial bone flexure.  No evidence of acute cervical spine fracture or subluxation. Degenerative spondylosis and congenital fusions, as described above.   Electronically Signed   By: Earle Gell M.D.   On:  04/02/2015 20:23   Ct Maxillofacial Wo Cm  04/02/2015   CLINICAL DATA:  Acute onset slurred speech, left-sided weakness, and left-sided facial droop. Fall onto floor. Head and neck injury. Facial pain and swelling. Initial encounter.  EXAM: CT HEAD WITHOUT CONTRAST  CT MAXILLOFACIAL WITHOUT CONTRAST  CT CERVICAL SPINE WITHOUT CONTRAST  TECHNIQUE: Multidetector CT imaging of the head, cervical spine, and maxillofacial structures were performed using the standard protocol without intravenous contrast. Multiplanar CT image reconstructions of the cervical spine and maxillofacial structures were also generated.  COMPARISON:  Head CT on 12/14/2012  FINDINGS: CT HEAD FINDINGS  There is no evidence of intracranial hemorrhage, brain edema, or other signs of acute infarction. There is no evidence of intracranial mass lesion or mass effect. No abnormal extraaxial fluid collections are identified.  Mild cerebral atrophy and associated ventriculomegaly are stable. Mild chronic small vessel disease again demonstrated. Mild frontal scalp soft tissue swelling is noted. No evidence of skull fracture or pneumocephalus.  CT MAXILLOFACIAL FINDINGS  Left supraorbital and preseptal soft tissue swelling is seen. Globes and other intraorbital anatomy are normal in appearance.  No evidence of orbital or facial bone fracture. No evidence of orbital emphysema or sinus air-fluid levels.  CT CERVICAL SPINE FINDINGS  No evidence of acute fracture, subluxation, or prevertebral soft tissue swelling.  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  Moderate degenerative disc disease demonstrated at levels of C4-5, C6-7, and C7-T1. Mild to moderate bilateral facet DJD also noted. Advanced atlantoaxial degenerative changes also seen as well as mild cervical kyphoscoliosis.  IMPRESSION: Frontal scalp soft tissue swelling. No evidence of skull fracture or acute  intracranial abnormality. Stable mild cerebral atrophy and chronic small vessel disease.  Left supraorbital and preseptal soft tissue swelling. No evidence of orbital or facial bone flexure.  No evidence of acute cervical spine fracture or subluxation. Degenerative spondylosis and congenital fusions, as described above.   Electronically Signed   By: Earle Gell M.D.   On: 04/02/2015 20:23  EKG Interpretation None      MDM   Final diagnoses:  Facial droop  Slurred speech  Left-sided weakness   81 year old male here with stroke like symptoms. Last known well 0900 this morning so out of the window for any acute intervention. He was found in the floor by his daughter. He does have injuries noted to the left side of his face. He has weakness and apparent lack of sensation of his left distal arm and left thigh. He has left facial droop and slurred speech. Patient was immediately taken to CT-- no intracranial, spinal, or facial injuries noted. Also no evidence of acute stroke on CT. Wrist films also negative.  Labwork is overall reassuring.  i have strong suspicion that patient has had an acute stroke.  Case discussed with neurology, Dr. Armida Sans, who has evaluated patient in the ED and recommends admission for stroke work-up, like right sided infarct.  Patient admitted to medicine service for further management.  Larene Pickett, PA-C 04/02/15 2311  Milton Ferguson, MD 04/03/15 [phone]

## 2015-04-02 NOTE — H&P (Signed)
[facility] History and Physical  ADE STMARIE WNU:[id number] DOB: 02/12/1922 DOA: 04/02/2015  Referring physician: ED physician PCP: [name], MD  Specialists:   Chief Complaint: Slurred speech, left-sided weakness, left facial droop  HPI: Nicholas Elliott is a 81 y.o. 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  Patient is accompanied by his friend, Ms Belva Chimes. Per his friend, patient was last known well at 0900 this morning. Ms Belva Chimes called him at about 3:00, when he did not answer. She became concerned and went to his house where she was found lying in the kitchen floor. Patient states he remembers falling and hit on table top, but was unsure why he fell. Patient was noted to have slurred speech, left-sided weakness and left facial droop. His slurred speech is old issue which may be slightly worse than baseline per Ms. Pierson. Patient has bruising and swelling around left eye and swelling of left wrist. He also has small skin tear over left leg laterally.  Patient does not have chest pain, cough, shortness of breath, abdominal pain, diarrhea,.   In ED, patient was found to have negative CT head for acute abnormalities, WBC 33.7, INR 1.07, PTT 23, UDS negative, urinalysis negative, temperature normal, tachycardia 104, electrolytes okay. Patient is admitted to inpatient for further evaluation and treatment. Neurology was consulted by ED.  Where does patient live?   At home   Can patient participate in ADLs?  Yes   Review of Systems:   General: no fevers, chills, no changes in body weight, has poor appetite, has fatigue HEENT: no blurry vision, hearing changes or sore throat. Has bruise over left eye Pulm: no dyspnea, coughing, wheezing CV: no chest pain, palpitations Abd: no nausea, vomiting,  abdominal pain, diarrhea, constipation GU: no dysuria, burning on urination, increased urinary frequency, hematuria  Ext: no leg edema Neuro: has left sided weakness, numbness, or tingling, no vision change or hearing loss Skin: no rash. Has small skin tear over left leg MSK: No muscle spasm, no deformity, no limitation of range of movement in spin Heme: No easy bruising.  Travel history: No recent long distant travel.  Allergy: No Known Allergies  Past Medical History  Diagnosis Date  . High cholesterol   . TIA (transient ischemic attack)   . Arthritis   . Glaucoma   . Migraine   . CLL (chronic lymphocytic leukemia)     Past Surgical History  Procedure Laterality Date  . Upper palate reparin    . Spine surgery      Social History:  reports that he has never smoked. He does not have any smokeless tobacco history on file. He reports that he does not drink alcohol or use illicit drugs.  Family History:  Family History  Problem Relation Age of Onset  . Stroke Father   . Cancer Sister   . Heart attack Brother      Prior to Admission medications   Medication Sig Start Date End Date Taking? Authorizing Provider  aspirin 81 MG tablet Take 81 mg by mouth daily.    Historical Provider, MD  clopidogrel (PLAVIX) 75 MG tablet Take 75 mg by mouth daily.    Historical Provider, MD  digoxin (LANOXIN) 0.125 MG tablet Take by mouth daily.    Historical Provider, MD  furosemide (LASIX) 20 MG tablet Take 20 mg by mouth. 0.5  tablet once a day in the morning    Historical Provider, MD  gabapentin (NEURONTIN) 300 MG capsule Take 300 mg by mouth 3 (three) times daily. 2 capsules once a day in the morning and 1 capsule at bedtime    Historical Provider, MD  HYDROcodone-acetaminophen (NORCO/VICODIN) 5-325 MG per tablet Take 1 tablet by mouth every 6 (six) hours as needed for moderate pain.    Historical Provider, MD  Misc Natural Products (OSTEO BI-FLEX JOINT SHIELD) TABS Take 1 tablet by mouth 2  (two) times daily.    Historical Provider, MD  Multiple Vitamins-Minerals (CENTRUM SILVER ADULT 50+ PO) Take by mouth daily.    Historical Provider, MD  Probiotic Product ([place]) Take 1 capsule by mouth daily.    Historical Provider, MD  rosuvastatin (CRESTOR) 20 MG tablet Take 20 mg by mouth daily.    Historical Provider, MD  simethicone (PHAZYME) 125 MG chewable tablet Chew 125 mg by mouth 2 (two) times daily.    Historical Provider, MD  traMADol (ULTRAM) 50 MG tablet Take by mouth 2 (two) times daily.    Historical Provider, MD  vitamin B-12 (CYANOCOBALAMIN) 100 MCG tablet Take 100 mcg by mouth daily.    Historical Provider, MD    Physical Exam: Filed Vitals:   04/02/15 2130 04/02/15 2200 04/02/15 2331 04/03/15 0156  BP: 149/74 148/70 148/72 144/69  Pulse: 102 102 90 75  Temp:   98.9 F (37.2 C) 98.5 F (36.9 C)  TempSrc:   Oral Oral  Resp: 21 26 22 22   Height:   6' (1.829 m)   Weight:   79.6 kg (175 lb 7.8 oz)   SpO2: 97% 96% 100% 98%   General: Not in acute distress HEENT:       Eyes: PERRL, EOMI, no scleral icterus. Bruise and swelling over left eye       ENT: No discharge from the ears and nose, no pharynx injection, no tonsillar enlargement.        Neck: No JVD, no bruit, no mass felt. Heme: No neck lymph node enlargement. Cardiac: S1/S2, RRR, 3/6 systolie murmurs, No gallops or rubs. Pulm:  No rales, wheezing, rhonchi or rubs. Abd: Soft, nondistended, nontender, no rebound pain, no organomegaly, BS present. Ext: No pitting leg edema bilaterally. 2+DP/PT pulse bilaterally. Musculoskeletal: No joint deformities, No joint redness or warmth, no limitation of ROM in spin. Skin: No rashes. Small skin tear over left leg. Neuro: Alert, oriented X3, cranial nerves II-XII grossly intact except for left facial droop, muscle strength 1/5 in left arm and leg, 5/5 in right extremities, sensation to light touch intact. Brachial reflex 1+ bilaterally. Knee reflex 1+  bilaterally. Negative Babinski's sign.  Psych: Patient is not psychotic, no suicidal or hemocidal ideation.  Labs on Admission:  Basic Metabolic Panel:  Recent Labs Lab 04/02/15 1952 04/02/15 1958  NA 140 143  K 4.2 4.0  CL 107 106  CO2 24  --   GLUCOSE 209* 203*  BUN 21* 25*  CREATININE 0.97 0.80  CALCIUM 8.9  --    Liver Function Tests:  Recent Labs Lab 04/02/15 1952  AST 44*  ALT 22  ALKPHOS 48  BILITOT 0.6  PROT 6.2*  ALBUMIN 3.9   No results for input(s): LIPASE, AMYLASE in the last 168 hours. No results for input(s): AMMONIA in the last 168 hours. CBC:  Recent Labs Lab 04/02/15 1952 04/02/15 1958  WBC 33.7*  --   NEUTROABS 10.8*  --  HGB 14.4 16.3  HCT 43.9 48.0  MCV 98.0  --   PLT 126*  --    Cardiac Enzymes: No results for input(s): CKTOTAL, CKMB, CKMBINDEX, TROPONINI in the last 168 hours.  BNP (last 3 results) No results for input(s): BNP in the last 8760 hours.  ProBNP (last 3 results) No results for input(s): PROBNP in the last 8760 hours.  CBG:  Recent Labs Lab 04/02/15 1945  GLUCAP 190*    Radiological Exams on Admission: Dg Wrist Complete Left  04/02/2015   CLINICAL DATA:  Recent fall with left wrist pain, initial encounter  EXAM: LEFT WRIST - COMPLETE 3+ VIEW  COMPARISON:  None.  FINDINGS: Degenerative changes of the first [facility] joint are seen. Mild osteopenia is noted. No acute fracture or dislocation is noted.  IMPRESSION: No acute abnormality noted.   Electronically Signed   By: Inez Catalina M.D.   On: 04/02/2015 20:47   Ct Head Wo Contrast  04/02/2015   CLINICAL DATA:  Acute onset slurred speech, left-sided weakness, and left-sided facial droop. Fall onto floor. Head and neck injury. Facial pain and swelling. Initial encounter.  EXAM: CT HEAD WITHOUT CONTRAST  CT MAXILLOFACIAL WITHOUT CONTRAST  CT CERVICAL SPINE WITHOUT CONTRAST  TECHNIQUE: Multidetector CT imaging of the head, cervical spine, and maxillofacial structures were  performed using the standard protocol without intravenous contrast. Multiplanar CT image reconstructions of the cervical spine and maxillofacial structures were also generated.  COMPARISON:  Head CT on 12/14/2012  FINDINGS: CT HEAD FINDINGS  There is no evidence of intracranial hemorrhage, brain edema, or other signs of acute infarction. There is no evidence of intracranial mass lesion or mass effect. No abnormal extraaxial fluid collections are identified.  Mild cerebral atrophy and associated ventriculomegaly are stable. Mild chronic small vessel disease again demonstrated. Mild frontal scalp soft tissue swelling is noted. No evidence of skull fracture or pneumocephalus.  CT MAXILLOFACIAL FINDINGS  Left supraorbital and preseptal soft tissue swelling is seen. Globes and other intraorbital anatomy are normal in appearance.  No evidence of orbital or facial bone fracture. No evidence of orbital emphysema or sinus air-fluid levels.  CT CERVICAL SPINE FINDINGS  No evidence of acute fracture, subluxation, or prevertebral soft tissue swelling.  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  Moderate degenerative disc disease demonstrated at levels of C4-5, C6-7, and C7-T1. Mild to moderate bilateral facet DJD also noted. Advanced atlantoaxial degenerative changes also seen as well as mild cervical kyphoscoliosis.  IMPRESSION: Frontal scalp soft tissue swelling. No evidence of skull fracture or acute intracranial abnormality. Stable mild cerebral atrophy and chronic small vessel disease.  Left supraorbital and preseptal soft tissue swelling. No evidence of orbital or facial bone flexure.  No evidence of acute cervical spine fracture or subluxation. Degenerative spondylosis and congenital fusions, as described above.   Electronically Signed   By: Earle Gell M.D.   On: 04/02/2015 20:23   Ct Cervical Spine Wo Contrast  04/02/2015    CLINICAL DATA:  Acute onset slurred speech, left-sided weakness, and left-sided facial droop. Fall onto floor. Head and neck injury. Facial pain and swelling. Initial encounter.  EXAM: CT HEAD WITHOUT CONTRAST  CT MAXILLOFACIAL WITHOUT CONTRAST  CT CERVICAL SPINE WITHOUT CONTRAST  TECHNIQUE: Multidetector CT imaging of the head, cervical spine, and maxillofacial structures were performed using the standard protocol without intravenous contrast. Multiplanar CT image reconstructions of the  cervical spine and maxillofacial structures were also generated.  COMPARISON:  Head CT on 12/14/2012  FINDINGS: CT HEAD FINDINGS  There is no evidence of intracranial hemorrhage, brain edema, or other signs of acute infarction. There is no evidence of intracranial mass lesion or mass effect. No abnormal extraaxial fluid collections are identified.  Mild cerebral atrophy and associated ventriculomegaly are stable. Mild chronic small vessel disease again demonstrated. Mild frontal scalp soft tissue swelling is noted. No evidence of skull fracture or pneumocephalus.  CT MAXILLOFACIAL FINDINGS  Left supraorbital and preseptal soft tissue swelling is seen. Globes and other intraorbital anatomy are normal in appearance.  No evidence of orbital or facial bone fracture. No evidence of orbital emphysema or sinus air-fluid levels.  CT CERVICAL SPINE FINDINGS  No evidence of acute fracture, subluxation, or prevertebral soft tissue swelling.  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  Moderate degenerative disc disease demonstrated at levels of C4-5, C6-7, and C7-T1. Mild to moderate bilateral facet DJD also noted. Advanced atlantoaxial degenerative changes also seen as well as mild cervical kyphoscoliosis.  IMPRESSION: Frontal scalp soft tissue swelling. No evidence of skull fracture or acute intracranial abnormality. Stable mild cerebral atrophy and chronic  small vessel disease.  Left supraorbital and preseptal soft tissue swelling. No evidence of orbital or facial bone flexure.  No evidence of acute cervical spine fracture or subluxation. Degenerative spondylosis and congenital fusions, as described above.   Electronically Signed   By: Earle Gell M.D.   On: 04/02/2015 20:23   Mr Jodene Nam Head Wo Contrast  04/02/2015   CLINICAL DATA:  Initial evaluation for left-sided deficit, slurred speech.  EXAM: MRI HEAD WITHOUT CONTRAST  MRA HEAD WITHOUT CONTRAST  TECHNIQUE: Multiplanar, multiecho pulse sequences of the brain and surrounding structures were obtained without intravenous contrast. Angiographic images of the head were obtained using MRA technique without contrast.  COMPARISON:  Prior CT from earlier the same day.  FINDINGS: MRI HEAD FINDINGS  Diffuse prominence of the CSF containing spaces is compatible with generalized cerebral atrophy. No significant white matter disease present for patient age. Small remote bilateral cerebellar infarcts present.  There are patchy multi focal wall predominantly cortical areas of ischemic infarction involving the right MCA territory. These involve the right insular cortex, operculum, and posterior right frontal region, including the right in precentral gyrus. Associated gyral swelling and edema on T2/FLAIR weighted sequences. No significant mass effect. There is associated petechial hemorrhage within the insular cortex (series 9, image 15). No evidence for frank hemorrhagic transformation. No other infarct. Intravascular flow voids maintained.  No mass lesion or midline shift. No mass effect. Ventricular prominence related to global parenchymal volume loss present without hydrocephalus. No extra-axial fluid collection.  Craniocervical junction within normal limits. Degenerative changes present about the C1-2 articulation. Pituitary gland normal.  No acute abnormality about the orbits. Globes are directed to the right. Sequelae of  prior bilateral lens extraction noted.  Small amount of layering opacity within the right sphenoid sinus. Mild mucosal thickening within the maxillary sinuses bilaterally, right greater than left. Minimal mucosal thickening within the ethmoidal air cells as well. The scattered opacity present within the bilateral mastoid air cells. Inner ear structures within normal limits.  Bone marrow signal intensity normal. Scalp edema present within the left parietal scalp. Edema also noted within the left periorbital region.  MRA HEAD FINDINGS  ANTERIOR CIRCULATION:  Visualized distal  cervical segments of the internal carotid arteries are widely patent with antegrade flow. The petrous, cavernous, and supra clinoid segments are widely patent as well. A1 segments, anterior communicating artery, and anterior cerebral arteries well opacified.  Right M1 segment widely patent without stenosis or occlusion. Right MCA bifurcation normal. Short-segment moderate stenosis within a proximal right M2 branch at the base of the right sylvian fissure (series 603, image 6 3). No proximal right M2 branch occlusion. Right MCA branches opacified distally.  There is focal moderate to severe stenosis within the distal left M1 segment just prior to the left MCA bifurcation. This stenosis measures approximately 4-5 mm in length. MCA bifurcation itself is within normal limits. Distal left MCA branches well opacified.  POSTERIOR CIRCULATION:  Vertebral arteries are widely patent to the vertebrobasilar junction. Right vertebral artery is dominant. Posterior inferior cerebellar arteries well opacified. There is a short-segment moderate stenosis within the proximal basilar artery (series 605, image 7). This stenosis measures approximately 3 mm in length. Basilar artery widely patent distally. No basilar tip aneurysm or stenosis. Superior cerebellar arteries patent proximally. Both posterior cerebral arteries arise from the basilar artery. Posterior  cerebral arteries are opacified to their distal aspects. A small right posterior communicating artery present.  No aneurysm or vascular malformation.  IMPRESSION: MRI HEAD IMPRESSION:  1. Patchy multi focal cortical right MCA territory infarcts as above. There is associated petechial hemorrhage within the right insular cortex without evidence of hemorrhagic transformation. 2. Age-related cerebral atrophy with small remote bilateral cerebellar infarcts.  MRA HEAD IMPRESSION:  1. No large vessel or proximal branch occlusion identified. 2. Short segment moderate stenosis within the proximal right M2 branch at the base of the sylvian fissure. 3. Focal moderate to severe stenosis of the distal left M1 segment. 4. Short-segment moderate stenosis within the proximal basilar artery   Electronically Signed   By: Jeannine Boga M.D.   On: 04/02/2015 23:48   Mr Brain Wo Contrast  04/02/2015   CLINICAL DATA:  Initial evaluation for left-sided deficit, slurred speech.  EXAM: MRI HEAD WITHOUT CONTRAST  MRA HEAD WITHOUT CONTRAST  TECHNIQUE: Multiplanar, multiecho pulse sequences of the brain and surrounding structures were obtained without intravenous contrast. Angiographic images of the head were obtained using MRA technique without contrast.  COMPARISON:  Prior CT from earlier the same day.  FINDINGS: MRI HEAD FINDINGS  Diffuse prominence of the CSF containing spaces is compatible with generalized cerebral atrophy. No significant white matter disease present for patient age. Small remote bilateral cerebellar infarcts present.  There are patchy multi focal wall predominantly cortical areas of ischemic infarction involving the right MCA territory. These involve the right insular cortex, operculum, and posterior right frontal region, including the right in precentral gyrus. Associated gyral swelling and edema on T2/FLAIR weighted sequences. No significant mass effect. There is associated petechial hemorrhage within the  insular cortex (series 9, image 15). No evidence for frank hemorrhagic transformation. No other infarct. Intravascular flow voids maintained.  No mass lesion or midline shift. No mass effect. Ventricular prominence related to global parenchymal volume loss present without hydrocephalus. No extra-axial fluid collection.  Craniocervical junction within normal limits. Degenerative changes present about the C1-2 articulation. Pituitary gland normal.  No acute abnormality about the orbits. Globes are directed to the right. Sequelae of prior bilateral lens extraction noted.  Small amount of layering opacity within the right sphenoid sinus. Mild mucosal thickening within the maxillary sinuses bilaterally, right greater than left. Minimal mucosal thickening within the  ethmoidal air cells as well. The scattered opacity present within the bilateral mastoid air cells. Inner ear structures within normal limits.  Bone marrow signal intensity normal. Scalp edema present within the left parietal scalp. Edema also noted within the left periorbital region.  MRA HEAD FINDINGS  ANTERIOR CIRCULATION:  Visualized distal cervical segments of the internal carotid arteries are widely patent with antegrade flow. The petrous, cavernous, and supra clinoid segments are widely patent as well. A1 segments, anterior communicating artery, and anterior cerebral arteries well opacified.  Right M1 segment widely patent without stenosis or occlusion. Right MCA bifurcation normal. Short-segment moderate stenosis within a proximal right M2 branch at the base of the right sylvian fissure (series 603, image 6 3). No proximal right M2 branch occlusion. Right MCA branches opacified distally.  There is focal moderate to severe stenosis within the distal left M1 segment just prior to the left MCA bifurcation. This stenosis measures approximately 4-5 mm in length. MCA bifurcation itself is within normal limits. Distal left MCA branches well opacified.   POSTERIOR CIRCULATION:  Vertebral arteries are widely patent to the vertebrobasilar junction. Right vertebral artery is dominant. Posterior inferior cerebellar arteries well opacified. There is a short-segment moderate stenosis within the proximal basilar artery (series 605, image 7). This stenosis measures approximately 3 mm in length. Basilar artery widely patent distally. No basilar tip aneurysm or stenosis. Superior cerebellar arteries patent proximally. Both posterior cerebral arteries arise from the basilar artery. Posterior cerebral arteries are opacified to their distal aspects. A small right posterior communicating artery present.  No aneurysm or vascular malformation.  IMPRESSION: MRI HEAD IMPRESSION:  1. Patchy multi focal cortical right MCA territory infarcts as above. There is associated petechial hemorrhage within the right insular cortex without evidence of hemorrhagic transformation. 2. Age-related cerebral atrophy with small remote bilateral cerebellar infarcts.  MRA HEAD IMPRESSION:  1. No large vessel or proximal branch occlusion identified. 2. Short segment moderate stenosis within the proximal right M2 branch at the base of the sylvian fissure. 3. Focal moderate to severe stenosis of the distal left M1 segment. 4. Short-segment moderate stenosis within the proximal basilar artery   Electronically Signed   By: Jeannine Boga M.D.   On: 04/02/2015 23:48   Ct Maxillofacial Wo Cm  04/02/2015   CLINICAL DATA:  Acute onset slurred speech, left-sided weakness, and left-sided facial droop. Fall onto floor. Head and neck injury. Facial pain and swelling. Initial encounter.  EXAM: CT HEAD WITHOUT CONTRAST  CT MAXILLOFACIAL WITHOUT CONTRAST  CT CERVICAL SPINE WITHOUT CONTRAST  TECHNIQUE: Multidetector CT imaging of the head, cervical spine, and maxillofacial structures were performed using the standard protocol without intravenous contrast. Multiplanar CT image reconstructions of the cervical  spine and maxillofacial structures were also generated.  COMPARISON:  Head CT on 12/14/2012  FINDINGS: CT HEAD FINDINGS  There is no evidence of intracranial hemorrhage, brain edema, or other signs of acute infarction. There is no evidence of intracranial mass lesion or mass effect. No abnormal extraaxial fluid collections are identified.  Mild cerebral atrophy and associated ventriculomegaly are stable. Mild chronic small vessel disease again demonstrated. Mild frontal scalp soft tissue swelling is noted. No evidence of skull fracture or pneumocephalus.  CT MAXILLOFACIAL FINDINGS  Left supraorbital and preseptal soft tissue swelling is seen. Globes and other intraorbital anatomy are normal in appearance.  No evidence of orbital or facial bone fracture. No evidence of orbital emphysema or sinus air-fluid levels.  CT CERVICAL SPINE FINDINGS  No evidence  of acute fracture, subluxation, or prevertebral soft tissue swelling.  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  Moderate degenerative disc disease demonstrated at levels of C4-5, C6-7, and C7-T1. Mild to moderate bilateral facet DJD also noted. Advanced atlantoaxial degenerative changes also seen as well as mild cervical kyphoscoliosis.  IMPRESSION: Frontal scalp soft tissue swelling. No evidence of skull fracture or acute intracranial abnormality. Stable mild cerebral atrophy and chronic small vessel disease.  Left supraorbital and preseptal soft tissue swelling. No evidence of orbital or facial bone flexure.  No evidence of acute cervical spine fracture or subluxation. Degenerative spondylosis and congenital fusions, as described above.   Electronically Signed   By: Earle Gell M.D.   On: 04/02/2015 20:23    EKG: Independently reviewed.  Abnormal findings:  First degree AV block Assessment/Plan Principal Problem:   Stroke Active Problems:   High cholesterol    Arthritis   Migraine   CLL (chronic lymphocytic leukemia)  Stroke: Patient's slurred speech and left-sided weakness are likely caused by stroke. Neurology was consulted, Dr. Armida Sans saw patient, recommended stroke workup -will admit to tele bed -Appreciate Dr. Gertha Calkin consultation, with follow-up recommendations and follows:  1. HgbA1c, fasting lipid panel 2. MRI, MRA of the brain without contrast 3. Echocardiogram 4. Carotid dopplers 5. Prophylactic therapy-aspirin and plavix pending results stroke evaluation 6. Risk factor modification 7. Telemetry monitoring 8. Frequent neuro checks 9. PT/OT SLP 10. NPO -hold lasix in the setting of acute stroke  HLD: Last LDL was not on record -Continue home medications: Crestor -Check FLP  CLL (chronic lymphocytic leukemia): has been followed by oncologist, Dr. Dr. Grayland Ormond. Last seen was on 03/24/15. Per Dr. Ishmael Holter note, CLL was confirmed by flow cytometry.Previously, CT of the chest, abdomen and pelvis only revealed mild splenomegaly. Currently, no intervention is required. WBC 40.9 on 03/05/15-->33.7. No bleeding tendency. Hemoglobin 14.4. -Follow up with oncologist.  Arthritis: -When necessary Percocet  Possible A fib?: Patient is taking digoxin at home, but diagnosis is not clear. Patient states that he has abnormal heartbeat. It is likely that the patient has PAF since his EKG has no A fib today. HA2DS2-VASc Score is 4, needs oral anticoagulation. Patient is not on any anti-coagulatants at home, likely due to old age and high risk for fall. Heart rate is well controlled without taking beta blocker or CCB medications. -continue ASA and plavix -tele bed  HLD: Last LDL was not on record -Continue home medications: Crestor -Check FLP   DVT ppx: SQ Heparin   Code Status: Full code Family Communication:  Yes, patient's friend, Ms. Pierson at bed side Disposition Plan: Admit to inpatient   Date of Service 04/03/2015    Ivor Costa [facility] Pager [phone]  If 7PM-7AM, please contact night-coverage [email or web address] Password [facility] 04/03/2015, 3:42 AM

## 2015-04-02 NOTE — ED Notes (Signed)
Per EMS, patient is from home, found by sister in the kitchen on the floor after trying to reach him by phone with no success. Sister last saw patient normal this morning at 9am. He has bruise to left eye, and left wrist, left sided deficit and slurred speech. Wearing c-collar on arrival.

## 2015-04-02 NOTE — ED Notes (Signed)
Pt to go to MRI then 4N

## 2015-04-02 NOTE — ED Notes (Signed)
Pt transported back from CT.  

## 2015-04-02 NOTE — ED Notes (Signed)
Pt was last seen normal by daughter at 0900 am this morning when daughter returned at 57 pt was found on the kitchen floor laying on left side. Pt is noted to have slurred speech, left sided weakness and left sided facial droop. Pt is alert and ox4.

## 2015-04-02 NOTE — Consult Note (Signed)
Referring Physician: Dr Roderic Palau    Chief Complaint: dysarthria, left hemiparesis, left face weakness  HPI:                                                                                                                                         Nicholas Elliott is an 81 y.o. male with a past medical history significant for hypercholesterolemia, TIA's, migraine,CLL, arthritis, and glaucoma, brought to the ED via EMS for evaluation of the above stated symptoms/fimndings. Patient is currently drowsy and able to answer questions rather appropriately, however he doesn't engage in conversation, therefore all clinical information was obtained from patient's [place]" Per EMS, patient is from home, found by sister in the kitchen on the floor after trying to reach him by phone with no success. Sister last saw patient normal this morning at 9am. He has bruise to left eye, and left wrist, left sided deficit and slurred speech. Wearing c-collar on arrival". " Pt was last seen normal by daughter at 0900 am this morning when daughter returned at 53 pt was found on the kitchen floor laying on left side. Pt is noted to have slurred speech, left sided weakness and left sided facial droop. Pt is alert and o x4".  CT brain performed tonight was personally reviewed and showed no acute abnormality. Serologies significant for wbc 33.7, platelets 126, UDS negative. ETOH level pending. On aspirin and plavix.  Date last known well: 04/02/15 Time last known well: 9 am tPA Given: no, out of the window   Past Medical History  Diagnosis Date  . High cholesterol   . TIA (transient ischemic attack)   . Arthritis   . Glaucoma   . Migraine     Past Surgical History  Procedure Laterality Date  . Upper palate reparin    . Spine surgery      Family History  Problem Relation Age of Onset  . Stroke Father   . Cancer Sister   . Heart attack Brother    Social History:  reports that he has never smoked. He does  not have any smokeless tobacco history on file. He reports that he does not drink alcohol or use illicit drugs.  Allergies: No Known Allergies  Medications:  I have reviewed the patient's current medications.  ROS: unable to obtain due to mental status.                                                                                                                                       History obtained from chart review   Physical exam: drowsy, in no apparent distress. Blood pressure 134/69, pulse 93, temperature 98.3 F (36.8 C), temperature source Oral, resp. rate 23, height _0  (1.778 m), weight 77.111 kg (170 lb), SpO2 97 %. Head: normocephalic. Neck: supple, no bruits, no JVD. Cardiac: no murmurs. Lungs: clear. Abdomen: soft, no tender, no mass. Extremities: no edema. Skin: no rash  Neurologic Examination:                                                                                                      General: Mental Status: Drowsy, oriented to year-month, knows president's name.  Speech dysarthric without evidence of aphasia.  Able to follow simple step commands without difficulty. Cranial Nerves: II: patient doesn't open his eyes and thus couldn't assess fundi, pupils equal, round, reactive to light III,IV, VI: ptosis not present, seems to have a right gaze preference. V,VII: smile asymmetric due to left face weakness, facial light touch sensation normal bilaterally VIII: hearing normal bilaterally IX,X: uvula rises symmetrically XI: bilateral shoulder shrug no tested XII: midline tongue extension without atrophy or fasciculations Motor: Left UE weakness Tone and bulk:normal tone throughout; no atrophy noted Sensory: Pinprick and light touch intact throughout, bilaterally Deep Tendon Reflexes:  Right: Upper Extremity   Left: Upper extremity    biceps (C-5 to C-6) 2/4   biceps (C-5 to C-6) 2/4 tricep (C7) 2/4    triceps (C7) 2/4 Brachioradialis (C6) 2/4  Brachioradialis (C6) 2/4  Lower Extremity Lower Extremity  quadriceps (L-2 to L-4) 2/4   quadriceps (L-2 to L-4) 2/4 Achilles (S1) 2/4   Achilles (S1) 2/4  Plantars: Right: downgoing   Left: upgoing Cerebellar: normal finger-to-nose, heel-to-shin unable to test Gait:  Unable to test due to mental status.    Results for orders placed or performed during the hospital encounter of 04/02/15 (from the past 48 hour(s))  CBG monitoring, ED     Status: Abnormal   Collection Time: 04/02/15  7:45 PM  Result Value Ref Range   Glucose-Capillary 190 (H) 65 - 99 mg/dL  Protime-INR     Status: None   Collection Time: 04/02/15  7:52 PM  Result Value Ref  Range   Prothrombin Time 14.1 11.6 - 15.2 seconds   INR 1.07 0.00 - 1.49  APTT     Status: Abnormal   Collection Time: 04/02/15  7:52 PM  Result Value Ref Range   aPTT 23 (L) 24 - 37 seconds  CBC     Status: Abnormal   Collection Time: 04/02/15  7:52 PM  Result Value Ref Range   WBC 33.7 (H) 4.0 - 10.5 K/uL   RBC 4.48 4.22 - 5.81 MIL/uL   Hemoglobin 14.4 13.0 - 17.0 g/dL   HCT 43.9 39.0 - 52.0 %   MCV 98.0 78.0 - 100.0 fL   MCH 32.1 26.0 - 34.0 pg   MCHC 32.8 30.0 - 36.0 g/dL   RDW 13.6 11.5 - 15.5 %   Platelets 126 (L) 150 - 400 K/uL  Differential     Status: Abnormal   Collection Time: 04/02/15  7:52 PM  Result Value Ref Range   Neutrophils Relative % 32 (L) 43 - 77 %   Lymphocytes Relative 64 (H) 12 - 46 %   Monocytes Relative 4 3 - 12 %   Eosinophils Relative 0 0 - 5 %   Basophils Relative 0 0 - 1 %   Neutro Abs 10.8 (H) 1.7 - 7.7 K/uL   Lymphs Abs 21.6 (H) 0.7 - 4.0 K/uL   Monocytes Absolute 1.3 (H) 0.1 - 1.0 K/uL   Eosinophils Absolute 0.0 0.0 - 0.7 K/uL   Basophils Absolute 0.0 0.0 - 0.1 K/uL   WBC Morphology ABSOLUTE LYMPHOCYTOSIS     Comment: ATYPICAL LYMPHOCYTES  Comprehensive metabolic panel      Status: Abnormal   Collection Time: 04/02/15  7:52 PM  Result Value Ref Range   Sodium 140 135 - 145 mmol/L   Potassium 4.2 3.5 - 5.1 mmol/L   Chloride 107 101 - 111 mmol/L   CO2 24 22 - 32 mmol/L   Glucose, Bld 209 (H) 65 - 99 mg/dL   BUN 21 (H) 6 - 20 mg/dL   Creatinine, Ser 0.97 0.61 - 1.24 mg/dL   Calcium 8.9 8.9 - 10.3 mg/dL   Total Protein 6.2 (L) 6.5 - 8.1 g/dL   Albumin 3.9 3.5 - 5.0 g/dL   AST 44 (H) 15 - 41 U/L   ALT 22 17 - 63 U/L   Alkaline Phosphatase 48 38 - 126 U/L   Total Bilirubin 0.6 0.3 - 1.2 mg/dL   GFR calc non Af Amer >60 >60 mL/min   GFR calc Af Amer >60 >60 mL/min    Comment: (NOTE) The eGFR has been calculated using the CKD EPI equation. This calculation has not been validated in all clinical situations. eGFR's persistently <60 mL/min signify possible Chronic Kidney Disease.    Anion gap 9 5 - 15  I-stat troponin, ED (not at [facility], [facility])     Status: None   Collection Time: 04/02/15  7:56 PM  Result Value Ref Range   Troponin i, poc 0.04 0.00 - 0.08 ng/mL   Comment 3            Comment: Due to the release kinetics of cTnI, a negative result within the first hours of the onset of symptoms does not rule out myocardial infarction with certainty. If myocardial infarction is still suspected, repeat the test at appropriate intervals.   I-Stat Chem 8, ED  (not at [facility], [facility])     Status: Abnormal   Collection Time: 04/02/15  7:58 PM  Result Value Ref  Range   Sodium 143 135 - 145 mmol/L   Potassium 4.0 3.5 - 5.1 mmol/L   Chloride 106 101 - 111 mmol/L   BUN 25 (H) 6 - 20 mg/dL   Creatinine, Ser 0.80 0.61 - 1.24 mg/dL   Glucose, Bld 203 (H) 65 - 99 mg/dL   Calcium, Ion 1.12 (L) 1.13 - 1.30 mmol/L   TCO2 23 0 - 100 mmol/L   Hemoglobin 16.3 13.0 - 17.0 g/dL   HCT 48.0 39.0 - 52.0 %  Urine rapid drug screen (hosp performed)not at [facility]     Status: None   Collection Time: 04/02/15  8:20 PM  Result Value Ref Range   Opiates NONE DETECTED NONE DETECTED    Cocaine NONE DETECTED NONE DETECTED   Benzodiazepines NONE DETECTED NONE DETECTED   Amphetamines NONE DETECTED NONE DETECTED   Tetrahydrocannabinol NONE DETECTED NONE DETECTED   Barbiturates NONE DETECTED NONE DETECTED    Comment:        DRUG SCREEN FOR MEDICAL PURPOSES ONLY.  IF CONFIRMATION IS NEEDED FOR ANY PURPOSE, NOTIFY LAB WITHIN 5 DAYS.        LOWEST DETECTABLE LIMITS FOR URINE DRUG SCREEN Drug Class       Cutoff (ng/mL) Amphetamine      1000 Barbiturate      200 Benzodiazepine   154 Tricyclics       008 Opiates          300 Cocaine          300 THC              50   Urinalysis, Routine w reflex microscopic (not at [facility])     Status: Abnormal   Collection Time: 04/02/15  8:20 PM  Result Value Ref Range   Color, Urine YELLOW YELLOW   APPearance CLEAR CLEAR   Specific Gravity, Urine 1.023 1.005 - 1.030   pH 5.5 5.0 - 8.0   Glucose, UA 250 (A) NEGATIVE mg/dL   Hgb urine dipstick SMALL (A) NEGATIVE   Bilirubin Urine NEGATIVE NEGATIVE   Ketones, ur 15 (A) NEGATIVE mg/dL   Protein, ur NEGATIVE NEGATIVE mg/dL   Urobilinogen, UA 0.2 0.0 - 1.0 mg/dL   Nitrite NEGATIVE NEGATIVE   Leukocytes, UA NEGATIVE NEGATIVE  Urine microscopic-add on     Status: Abnormal   Collection Time: 04/02/15  8:20 PM  Result Value Ref Range   Squamous Epithelial / LPF FEW (A) RARE   WBC, UA 0-2 <3 WBC/hpf   RBC / HPF 3-6 <3 RBC/hpf   Bacteria, UA FEW (A) RARE   Dg Wrist Complete Left  04/02/2015   CLINICAL DATA:  Recent fall with left wrist pain, initial encounter  EXAM: LEFT WRIST - COMPLETE 3+ VIEW  COMPARISON:  None.  FINDINGS: Degenerative changes of the first [facility] joint are seen. Mild osteopenia is noted. No acute fracture or dislocation is noted.  IMPRESSION: No acute abnormality noted.   Electronically Signed   By: Inez Catalina M.D.   On: 04/02/2015 20:47   Ct Head Wo Contrast  04/02/2015   CLINICAL DATA:  Acute onset slurred speech, left-sided weakness, and left-sided facial droop. Fall  onto floor. Head and neck injury. Facial pain and swelling. Initial encounter.  EXAM: CT HEAD WITHOUT CONTRAST  CT MAXILLOFACIAL WITHOUT CONTRAST  CT CERVICAL SPINE WITHOUT CONTRAST  TECHNIQUE: Multidetector CT imaging of the head, cervical spine, and maxillofacial structures were performed using the standard protocol without intravenous contrast. Multiplanar CT image reconstructions of  the cervical spine and maxillofacial structures were also generated.  COMPARISON:  Head CT on 12/14/2012  FINDINGS: CT HEAD FINDINGS  There is no evidence of intracranial hemorrhage, brain edema, or other signs of acute infarction. There is no evidence of intracranial mass lesion or mass effect. No abnormal extraaxial fluid collections are identified.  Mild cerebral atrophy and associated ventriculomegaly are stable. Mild chronic small vessel disease again demonstrated. Mild frontal scalp soft tissue swelling is noted. No evidence of skull fracture or pneumocephalus.  CT MAXILLOFACIAL FINDINGS  Left supraorbital and preseptal soft tissue swelling is seen. Globes and other intraorbital anatomy are normal in appearance.  No evidence of orbital or facial bone fracture. No evidence of orbital emphysema or sinus air-fluid levels.  CT CERVICAL SPINE FINDINGS  No evidence of acute fracture, subluxation, or prevertebral soft tissue swelling.  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  Moderate degenerative disc disease demonstrated at levels of C4-5, C6-7, and C7-T1. Mild to moderate bilateral facet DJD also noted. Advanced atlantoaxial degenerative changes also seen as well as mild cervical kyphoscoliosis.  IMPRESSION: Frontal scalp soft tissue swelling. No evidence of skull fracture or acute intracranial abnormality. Stable mild cerebral atrophy and chronic small vessel disease.  Left supraorbital and preseptal soft tissue swelling. No evidence of  orbital or facial bone flexure.  No evidence of acute cervical spine fracture or subluxation. Degenerative spondylosis and congenital fusions, as described above.   Electronically Signed   By: Earle Gell M.D.   On: 04/02/2015 20:23   Ct Cervical Spine Wo Contrast  04/02/2015   CLINICAL DATA:  Acute onset slurred speech, left-sided weakness, and left-sided facial droop. Fall onto floor. Head and neck injury. Facial pain and swelling. Initial encounter.  EXAM: CT HEAD WITHOUT CONTRAST  CT MAXILLOFACIAL WITHOUT CONTRAST  CT CERVICAL SPINE WITHOUT CONTRAST  TECHNIQUE: Multidetector CT imaging of the head, cervical spine, and maxillofacial structures were performed using the standard protocol without intravenous contrast. Multiplanar CT image reconstructions of the cervical spine and maxillofacial structures were also generated.  COMPARISON:  Head CT on 12/14/2012  FINDINGS: CT HEAD FINDINGS  There is no evidence of intracranial hemorrhage, brain edema, or other signs of acute infarction. There is no evidence of intracranial mass lesion or mass effect. No abnormal extraaxial fluid collections are identified.  Mild cerebral atrophy and associated ventriculomegaly are stable. Mild chronic small vessel disease again demonstrated. Mild frontal scalp soft tissue swelling is noted. No evidence of skull fracture or pneumocephalus.  CT MAXILLOFACIAL FINDINGS  Left supraorbital and preseptal soft tissue swelling is seen. Globes and other intraorbital anatomy are normal in appearance.  No evidence of orbital or facial bone fracture. No evidence of orbital emphysema or sinus air-fluid levels.  CT CERVICAL SPINE FINDINGS  No evidence of acute fracture, subluxation, or prevertebral soft tissue swelling.  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  Moderate degenerative disc disease demonstrated at levels of C4-5, C6-7, and C7-T1. Mild to  moderate bilateral facet DJD also noted. Advanced atlantoaxial degenerative changes also seen as well as mild cervical kyphoscoliosis.  IMPRESSION: Frontal scalp soft tissue swelling. No evidence of skull fracture or acute intracranial abnormality. Stable mild cerebral atrophy and chronic small vessel disease.  Left supraorbital and preseptal soft tissue swelling. No evidence of orbital or  facial bone flexure.  No evidence of acute cervical spine fracture or subluxation. Degenerative spondylosis and congenital fusions, as described above.   Electronically Signed   By: Earle Gell M.D.   On: 04/02/2015 20:23   Ct Maxillofacial Wo Cm  04/02/2015   CLINICAL DATA:  Acute onset slurred speech, left-sided weakness, and left-sided facial droop. Fall onto floor. Head and neck injury. Facial pain and swelling. Initial encounter.  EXAM: CT HEAD WITHOUT CONTRAST  CT MAXILLOFACIAL WITHOUT CONTRAST  CT CERVICAL SPINE WITHOUT CONTRAST  TECHNIQUE: Multidetector CT imaging of the head, cervical spine, and maxillofacial structures were performed using the standard protocol without intravenous contrast. Multiplanar CT image reconstructions of the cervical spine and maxillofacial structures were also generated.  COMPARISON:  Head CT on 12/14/2012  FINDINGS: CT HEAD FINDINGS  There is no evidence of intracranial hemorrhage, brain edema, or other signs of acute infarction. There is no evidence of intracranial mass lesion or mass effect. No abnormal extraaxial fluid collections are identified.  Mild cerebral atrophy and associated ventriculomegaly are stable. Mild chronic small vessel disease again demonstrated. Mild frontal scalp soft tissue swelling is noted. No evidence of skull fracture or pneumocephalus.  CT MAXILLOFACIAL FINDINGS  Left supraorbital and preseptal soft tissue swelling is seen. Globes and other intraorbital anatomy are normal in appearance.  No evidence of orbital or facial bone fracture. No evidence of orbital  emphysema or sinus air-fluid levels.  CT CERVICAL SPINE FINDINGS  No evidence of acute fracture, subluxation, or prevertebral soft tissue swelling.  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  Moderate degenerative disc disease demonstrated at levels of C4-5, C6-7, and C7-T1. Mild to moderate bilateral facet DJD also noted. Advanced atlantoaxial degenerative changes also seen as well as mild cervical kyphoscoliosis.  IMPRESSION: Frontal scalp soft tissue swelling. No evidence of skull fracture or acute intracranial abnormality. Stable mild cerebral atrophy and chronic small vessel disease.  Left supraorbital and preseptal soft tissue swelling. No evidence of orbital or facial bone flexure.  No evidence of acute cervical spine fracture or subluxation. Degenerative spondylosis and congenital fusions, as described above.   Electronically Signed   By: Earle Gell M.D.   On: 04/02/2015 20:23    Assessment: 81 y.o. 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 Patient is very functional and still living independently, but unfortunately at this time he is out of the window for IV tpa or endovascular treatment. Will recommend admission to medicine, completion of stroke work up.  Stroke team will resume care tomorrow.  Stroke Risk Factors - age, hyperlipidemia, TIA.  Plan: 1. HgbA1c, fasting lipid panel 2. MRI, MRA  of the brain without contrast 3. Echocardiogram 4. Carotid dopplers 5. Prophylactic therapy-aspirin and plavix pending results stroke evaluation 6. Risk factor modification 7. Telemetry monitoring 8. Frequent neuro checks 9. PT/OT SLP 10. NPO  Dorian Pod, MD [facility] [phone]  04/02/2015, 9:18 PM

## 2015-04-03 ENCOUNTER — Inpatient Hospital Stay (HOSPITAL_COMMUNITY): Payer: Medicare PPO

## 2015-04-03 ENCOUNTER — Ambulatory Visit (HOSPITAL_COMMUNITY): Payer: Medicare PPO

## 2015-04-03 DIAGNOSIS — I639 Cerebral infarction, unspecified: Secondary | ICD-10-CM

## 2015-04-03 DIAGNOSIS — S5002XA Contusion of left elbow, initial encounter: Secondary | ICD-10-CM

## 2015-04-03 DIAGNOSIS — S0083XA Contusion of other part of head, initial encounter: Secondary | ICD-10-CM

## 2015-04-03 DIAGNOSIS — R739 Hyperglycemia, unspecified: Secondary | ICD-10-CM

## 2015-04-03 DIAGNOSIS — M25552 Pain in left hip: Secondary | ICD-10-CM | POA: Diagnosis present

## 2015-04-03 HISTORY — DX: Contusion of left elbow, initial encounter: S50.02XA

## 2015-04-03 HISTORY — DX: Hyperglycemia, unspecified: R73.9

## 2015-04-03 HISTORY — DX: Contusion of other part of head, initial encounter: S00.83XA

## 2015-04-03 HISTORY — DX: Pain in left hip: M25.552

## 2015-04-03 LAB — LIPID PANEL
Cholesterol: 134 mg/dL (ref 0–200)
HDL: 48 mg/dL (ref >40)
LDL Cholesterol: 80 mg/dL (ref 0–99)
Total CHOL/HDL Ratio: 2.8 RATIO
Triglycerides: 31 mg/dL (ref <150)
VLDL: 6 mg/dL (ref 0–40)

## 2015-04-03 LAB — BRAIN NATRIURETIC PEPTIDE: B Natriuretic Peptide: 280.1 pg/mL — ABNORMAL HIGH (ref 0.0–100.0)

## 2015-04-03 LAB — ETHANOL

## 2015-04-03 LAB — PATHOLOGIST SMEAR REVIEW

## 2015-04-03 MED ORDER — MORPHINE SULFATE 2 MG/ML IJ SOLN
1.0000 mg | Freq: Once | INTRAMUSCULAR | Status: AC
  Administered 2015-04-03: 1 mg via INTRAVENOUS
  Filled 2015-04-03: qty 1

## 2015-04-03 MED ORDER — RESOURCE THICKENUP CLEAR PO POWD
ORAL | Status: DC | PRN
  Filled 2015-04-03: qty 125

## 2015-04-03 MED ORDER — ACETAMINOPHEN 325 MG PO TABS
650.0000 mg | ORAL_TABLET | Freq: Four times a day (QID) | ORAL | Status: DC | PRN
  Administered 2015-04-04 – 2015-04-08 (×8): 650 mg via ORAL
  Filled 2015-04-03 (×9): qty 2

## 2015-04-03 NOTE — Evaluation (Signed)
Clinical/Bedside Swallow Evaluation Patient Details  Name: Nicholas Elliott MRN: [id number] Date of Birth: Apr 05, 1922  Today's Date: 04/03/2015 Time: SLP Start Time (ACUTE ONLY): 1100 SLP Stop Time (ACUTE ONLY): 1115 SLP Time Calculation (min) (ACUTE ONLY): 15 min  Past Medical History:  Past Medical History  Diagnosis Date  . High cholesterol   . TIA (transient ischemic attack)   . Arthritis   . Glaucoma   . Migraine   . CLL (chronic lymphocytic leukemia)    Past Surgical History:  Past Surgical History  Procedure Laterality Date  . Upper palate reparin    . Spine surgery     HPI:  81 y.o. 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  Pt very active, independent PTA.  Lives alone, plays golf twice a week, has support network of friends.  Hx also includes cleft palate with repair at [facility] in 1951 - speech at baseline is marked by hypernasal resonance, impacting speech clarity.     Assessment / Plan / Recommendation Clinical Impression  Pt presents with a neurogenic dysphagia with focal cranial nerve deficits on the left, suspected swallow delay, overt s/s of aspiration associated with thin liquids and nectars.  Pt presented with improved toleration of honey-thick liquids and adequate mastication of soft solids.  Recommend initiating a dysphagia 3 diet with honey-thick liquids; meds whole in puree.  SLP will follow for toleration and determine necessity of MBS.  Educated POA, who was at bedside.      Aspiration Risk  Moderate    Diet Recommendation Dysphagia 3 (Mech soft);Honey   Medication Administration: Whole meds with puree Compensations: Small sips/bites;Slow rate    Other  Recommendations Oral Care Recommendations: Oral care BID Other Recommendations: Order thickener from pharmacy   Follow Up Recommendations       Frequency and Duration min 2x/week   2 weeks       SLP Swallow Goals     Swallow Study Prior Functional Status       General Date of Onset: 04/02/15 Other Pertinent Information: 81 y.o. 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  Pt very active, independent PTA.  Lives alone, plays golf twice a week, has support network of friends.  Hx also includes cleft palate with repair at [facility] in 1951 - speech at baseline is marked by hypernasal resonance, impacting speech clarity.   Type of Study: Bedside swallow evaluation Diet Prior to this Study: NPO Temperature Spikes Noted: No Respiratory Status: Supplemental O2 delivered via (comment) History of Recent Intubation: No Behavior/Cognition: Alert;Cooperative Oral Cavity - Dentition: Edentulous Self-Feeding Abilities: Able to feed self Patient Positioning: Upright in bed Baseline Vocal Quality: Normal (hypernasal resonance) Volitional Cough: Strong Volitional Swallow: Able to elicit    Oral/Motor/Sensory Function Overall Oral Motor/Sensory Function: Impaired (decrease CN VII on left)   Ice Chips Ice chips: Impaired Presentation: Spoon Oral Phase Impairments: Reduced labial seal Oral Phase Functional Implications: Left anterior spillage Pharyngeal Phase Impairments: Suspected delayed Swallow;Wet Vocal Quality;Cough - Immediate   Thin Liquid Thin Liquid: Impaired Presentation: Self Fed Oral Phase Impairments: Reduced labial seal Oral Phase Functional Implications: Left anterior spillage Pharyngeal  Phase Impairments: Suspected delayed Swallow;Multiple swallows;Cough - Immediate    Nectar Thick Nectar Thick Liquid: Impaired Presentation: Self Fed Pharyngeal Phase Impairments: Suspected delayed  Swallow;Multiple swallows;Throat Clearing - Delayed   Honey Thick Honey Thick Liquid: Impaired Presentation: Cup;Self fed Oral Phase Impairments: Reduced  labial seal Pharyngeal Phase Impairments: Multiple swallows   Puree Puree: Within functional limits Presentation: Self Fed;Spoon   Solid   GO    Solid: Impaired Presentation: Self Fed Pharyngeal Phase Impairments: Suspected delayed Swallow       [name] 04/03/2015,1:29 PM

## 2015-04-03 NOTE — Progress Notes (Signed)
Utilization Review Completed.Nicholas Elliott T7/14/2016  

## 2015-04-03 NOTE — Progress Notes (Signed)
PT Cancellation Note  Patient Details Name: Nicholas Elliott MRN: [id number] DOB: 1922-08-21   Cancelled Treatment:    Reason Eval/Treat Not Completed: Patient not medically ready (remains on active bedrest orders at this time)   Duncan Dull 04/03/2015, 7:18 AM Alben Deeds, PT DPT  [phone]

## 2015-04-03 NOTE — Progress Notes (Signed)
VASCULAR LAB PRELIMINARY  PRELIMINARY  PRELIMINARY  PRELIMINARY  Carotid duplex  completed.    Preliminary report:  Bilateral:  1-39% ICA stenosis.  Vertebral artery flow is antegrade.      March Steyer, RVT 04/03/2015, 4:12 PM

## 2015-04-03 NOTE — Progress Notes (Signed)
STROKE TEAM PROGRESS NOTE   HISTORY Nicholas Elliott is a 81 y.o. male with a past medical history significant for hypercholesterolemia, TIA's, migraine,CLL, arthritis, and glaucoma, brought to the ED via EMS for evaluation of dysarthria, left hemiparesis, and left facial weakness. Patient is currently drowsy and able to answer questions rather appropriately, however he doesn't engage in conversation, therefore all clinical information was obtained from patient's [place]" Per EMS, patient is from home, found by sister in the kitchen on the floor after trying to reach him by phone with no success. Sister last saw patient normal this morning at 9am. He has bruise to left eye, and left wrist, left sided deficit and slurred speech. Wearing c-collar on arrival". " Pt was last seen normal by daughter at 0900 am this morning when daughter returned at 29 pt was found on the kitchen floor laying on left side. Pt is noted to have slurred speech, left sided weakness and left sided facial droop. Pt is alert and o x4".  CT brain performed tonight was personally reviewed and showed no acute abnormality. Serologies significant for wbc 33.7, platelets 126, UDS negative. ETOH level pending. On aspirin and plavix.  Date last known well: 04/02/15 Time last known well: 9 am tPA Given: no, out of the window   SUBJECTIVE (INTERVAL HISTORY) Family members present. The patient is somewhat lethargic and slow to speak. He is dysarthric. He voices no specific complaints.   OBJECTIVE Temp:  [98 F (36.7 C)-99.3 F (37.4 C)] 98 F (36.7 C) (07/14 1445) Pulse Rate:  [75-104] 85 (07/14 1445) Cardiac Rhythm:  [-] Normal sinus rhythm (07/14 0800) Resp:  [19-26] 20 (07/14 1445) BP: (121-149)/(52-74) 121/58 mmHg (07/14 1445) SpO2:  [94 %-100 %] 96 % (07/14 1445) Weight:  [77.111 kg (170 lb)-79.6 kg (175 lb 7.8 oz)] 79.6 kg (175 lb 7.8 oz) (07/13 2331)   Recent Labs Lab 04/02/15 1945  GLUCAP 190*     Recent Labs Lab 04/02/15 1952 04/02/15 1958  NA 140 143  K 4.2 4.0  CL 107 106  CO2 24  --   GLUCOSE 209* 203*  BUN 21* 25*  CREATININE 0.97 0.80  CALCIUM 8.9  --     Recent Labs Lab 04/02/15 1952  AST 44*  ALT 22  ALKPHOS 48  BILITOT 0.6  PROT 6.2*  ALBUMIN 3.9    Recent Labs Lab 04/02/15 1952 04/02/15 1958  WBC 33.7*  --   NEUTROABS 10.8*  --   HGB 14.4 16.3  HCT 43.9 48.0  MCV 98.0  --   PLT 126*  --    No results for input(s): CKTOTAL, CKMB, CKMBINDEX, TROPONINI in the last 168 hours.  Recent Labs  04/02/15 1952  LABPROT 14.1  INR 1.07    Recent Labs  04/02/15 2020  COLORURINE YELLOW  LABSPEC 1.023  PHURINE 5.5  GLUCOSEU 250*  HGBUR SMALL*  BILIRUBINUR NEGATIVE  KETONESUR 15*  PROTEINUR NEGATIVE  UROBILINOGEN 0.2  NITRITE NEGATIVE  LEUKOCYTESUR NEGATIVE       Component Value Date/Time   CHOL 134 04/03/2015 0012   TRIG 31 04/03/2015 0012   HDL 48 04/03/2015 0012   CHOLHDL 2.8 04/03/2015 0012   VLDL 6 04/03/2015 0012   LDLCALC 80 04/03/2015 0012   No results found for: HGBA1C    Component Value Date/Time   LABOPIA NONE DETECTED 04/02/2015 2020   COCAINSCRNUR NONE DETECTED 04/02/2015 2020   LABBENZ NONE DETECTED 04/02/2015 2020   AMPHETMU NONE DETECTED 04/02/2015  Nicholas Elliott DETECTED 04/02/2015 2020   LABBARB NONE DETECTED 04/02/2015 2020     Recent Labs Lab 04/03/15 0012  ETH <5   Imaging    Dg Wrist Complete Left 04/02/2015    No acute abnormality noted.   Ct Head Wo Contrast 04/02/2015    Frontal scalp soft tissue swelling. No evidence of skull fracture or acute intracranial abnormality. Stable mild cerebral atrophy and chronic small vessel disease.  Left supraorbital and preseptal soft tissue swelling. No evidence of orbital or facial bone flexure.  No evidence of acute cervical spine fracture or subluxation. Degenerative spondylosis and congenital fusions, as described above.      Ct Cervical Spine Wo  Contrast 04/02/2015    Frontal scalp soft tissue swelling. No evidence of skull fracture or acute intracranial abnormality. Stable mild cerebral atrophy and chronic small vessel disease.  Left supraorbital and preseptal soft tissue swelling. No evidence of orbital or facial bone flexure.  No evidence of acute cervical spine fracture or subluxation. Degenerative spondylosis and congenital fusions, as described above.      Mr Jodene Nam Head Wo Contrast 04/02/2015     MRI HEAD IMPRESSION:   1. Patchy multi focal cortical right MCA territory infarcts as above. There is associated petechial hemorrhage within the right insular cortex without evidence of hemorrhagic transformation.  2. Age-related cerebral atrophy with small remote bilateral cerebellar infarcts.    MRA HEAD IMPRESSION:   1. No large vessel or proximal branch occlusion identified.  2. Short segment moderate stenosis within the proximal right M2 branch at the base of the sylvian fissure.  3. Focal moderate to severe stenosis of the distal left M1 segment.  4. Short-segment moderate stenosis within the proximal basilar artery      Ct Maxillofacial Wo Cm 04/02/2015    Frontal scalp soft tissue swelling. No evidence of skull fracture or acute intracranial abnormality. Stable mild cerebral atrophy and chronic small vessel disease.  Left supraorbital and preseptal soft tissue swelling. No evidence of orbital or facial bone flexure.  No evidence of acute cervical spine fracture or subluxation. Degenerative spondylosis and congenital fusions, as described above.       PHYSICAL EXAM Frail elderly cachectic Caucasian male not in distress. Left frontal and periorbitall ecchymosis from fall and contusion. Conjunctival congestion however the left eye and pain on passive opening of left eye which appears closed and swollen. . Afebrile. Head is nontraumatic. Neck is supple without bruit.    Cardiac exam no murmur or gallop. Lungs are clear to  auscultation. Distal pulses are well felt.  Neurological Exam :  Drowsy but can be easily awakened and follows commands. Oriented 2. Mild dysarthria but can be understood with some difficulty. No aphasia or apraxia. Follows one and a few two-step commands Left eye is swollen and closed and painful on passive opening with conjunctival congestion. Right gaze preference and able to look to the left Nicholas Elliott up to midline. Blinks to threat on the right but not the left. Left lower facial weakness. Tongue midline. Left upper extremity is plegic 0/5 strength with hypotonia. Left lower extremity 4/5 strength with only mild weakness of hip flexors and ankle dorsiflexors. Sensation appears preserved bilaterally. Deep tendon reflexes are depressed on the left and normal on the right. Left plantar is equivocal right is downgoing. Gait was not tested.   ASSESSMENT/PLAN Mr. Nicholas Elliott is a 81 y.o. male with history of hyperlipidemia, previous TIA, migraine headaches, and chronic lymphocytic leukemia  presenting with dysarthria, left hemiparesis, and left facial weakness after being found on the floor in the kitchen. He did not receive IV t-PA due to late presentation.  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  Resultant dysarthria and left upper extremity weakness.  MRI  Patchy multi focal cortical right MCA territory infarcts  MRA   Focal moderate to severe stenosis of the distal left M1 segment. moderate stenosis within the proximal  right M2 branch  Carotid Doppler  Bilateral: 1-39% ICA stenosis. Vertebral artery flow is antegrade.   2D Echo pending  LDL 80  HgbA1c pending  Subcutaneous heparin for VTE prophylaxis DIET DYS 3 Room service appropriate?: Yes; Fluid consistency:: Honey Thick  aspirin 81 mg orally every day and clopidogrel 75 mg orally every day prior to admission, now on aspirin 81 mg orally every day and clopidogrel 75 mg orally every  day  Ongoing aggressive stroke risk factor management  Therapy recommendations:  Pending  Disposition:  Pending  Hypertension  Home meds: No antihypertensives medications prior to admission.  Stable   Hyperlipidemia  Home meds:  Crestor 20 mg daily resumed in hospital  LDL 80, goal < 70  Continue statin at discharge    Other Stroke Risk Factors  Advanced age  Hx stroke/TIA  Family hx stroke (father)   Other Active Problems  Multiple contusions  Mild dehydration  Other Pertinent History  Not a candidate for anticoagulation secondary to falls  Hospital day # [place] PA-C [facility] Pager [phone] 04/03/2015, 4:27 PM I have personally examined this patient, reviewed notes, independently viewed imaging studies, participated in medical decision making and plan of care. I have made any additions or clarifications directly to the above note. Agree with note above. He presented with a fall. leadng to left frontal and periorbital contusion and left hemiparesis secondary to right MCA branch infarct likely of embolic etiology. He remains at risk for neurological worsening, recurrent stroke and TIAs. He'll benefit from ongoing stroke evaluation and risk factor controll.Marland Kitchen He may not be a good long-term candidate for anticoagulation given advanced age, fall risk and hence we will not pursue TEE and loop recorder Nicholas Contras, MD Medical Director [facility] Pager: [phone] 04/03/2015 4:55 PM     To contact Stroke Continuity provider, please refer to [email or web address] After hours, contact General Neurology

## 2015-04-03 NOTE — Evaluation (Signed)
Speech Language Pathology Evaluation Patient Details Name: RODRICKUS MIN MRN: [id number] DOB: Apr 23, 1922 Today's Date: 04/03/2015 Time: 2725-3664 SLP Time Calculation (min) (ACUTE ONLY): 15 min  Problem List:  Patient Active Problem List   Diagnosis Date Noted  . Facial droop 04/02/2015  . Stroke 04/02/2015  . High cholesterol   . TIA (transient ischemic attack)   . Arthritis   . Migraine   . CLL (chronic lymphocytic leukemia)    Past Medical History:  Past Medical History  Diagnosis Date  . High cholesterol   . TIA (transient ischemic attack)   . Arthritis   . Glaucoma   . Migraine   . CLL (chronic lymphocytic leukemia)    Past Surgical History:  Past Surgical History  Procedure Laterality Date  . Upper palate reparin    . Spine surgery     HPI:  81 y.o. 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  Pt very active, independent PTA.  Lives alone, plays golf twice a week, has support network of friends.  Hx also includes cleft palate with repair at [facility] in 1951 - speech at baseline is marked by hypernasal resonance, impacting speech clarity.     Assessment / Plan / Recommendation Clinical Impression  Pt presents with cognitive impairments s/p acute CVA characterized by left inattention, mild deficits in short-term recall, impaired insight and problem-solving.  Mild dysarthria overlies chronic hypernasal resonance due to cleft palate/s/p repair.   Pt independent PTA; lived alone and very active.  Recommend SLP treatment to address cognition and dysphagia.  Await OT and PT evals to determine if CIR would be appropriate venue for rehab.      SLP Assessment  Patient needs continued Speech Lanaguage Pathology Services    Follow Up Recommendations   (tba)    Frequency and Duration min 2x/week  2 weeks   Pertinent Vitals/Pain Pain Assessment: No/denies  pain   SLP Goals  Potential to Achieve Goals (ACUTE ONLY): Good  SLP Evaluation Prior Functioning  Cognitive/Linguistic Baseline: Within functional limits  Lives With: Alone   Cognition  Overall Cognitive Status: Impaired/Different from baseline Arousal/Alertness: Awake/alert Orientation Level: Oriented X4 Attention: Selective Selective Attention: Impaired Selective Attention Impairment: Verbal basic;Functional basic Memory: Impaired Memory Impairment: Decreased recall of new information Awareness: Impaired Awareness Impairment: Intellectual impairment Problem Solving: Impaired Problem Solving Impairment: Verbal complex Safety/Judgment: Impaired    Comprehension  Auditory Comprehension Overall Auditory Comprehension: Appears within functional limits for tasks assessed Visual Recognition/Discrimination Discrimination: Within Function Limits    Expression Expression Primary Mode of Expression: Verbal Verbal Expression Overall Verbal Expression: Appears within functional limits for tasks assessed Written Expression Dominant Hand: Right Written Expression: Not tested   Oral / Motor Oral Motor/Sensory Function Overall Oral Motor/Sensory Function: Impaired (decrease CN VII on left) Motor Speech Overall Motor Speech:  (baseline hypernasal resonance due to cleft palate repair)   GO     [name] 04/03/2015, 1:42 PM

## 2015-04-03 NOTE — Progress Notes (Signed)
PT Cancellation Note  Patient Details Name: Nicholas Elliott MRN: [id number] DOB: 1922/07/07   Cancelled Treatment:    Reason Eval/Treat Not Completed: Fatigue/lethargy limiting ability to participate Patient difficult to arouse and cannot remain awake for therapy evaluation. RN notified. Will try again, likely tomorrow AM as time/schedule and patient participation allows.   Ellouise Newer 04/03/2015, 4:14 PM Elayne Snare, Elliott

## 2015-04-03 NOTE — Progress Notes (Signed)
Pt. arrvied via tech ffrom ED withy family friend and son at bedside. Reports no pain and VSS. Will continue to monitor. Joaquin Bend E, RN 7/136/2016 0000

## 2015-04-03 NOTE — Consult Note (Signed)
WOC wound consult note Reason for Consult: LLE wound, bruise over left eye Wound type: skin tear LLE, contusion left eye Measurement: 1.0cm x 1.5cm x 0.1cm Wound bed: partial thickness skin loss, pink, moist Drainage (amount, consistency, odor) minimal, non purulent  Periwound: intact Dressing procedure/placement/frequency: silicone foam to protect and insulate No topical care needed for the contusion over the eye at this time.  Discussed POC with patient and bedside nurse.  Re consult if needed, will not follow at this time. Thanks  Jordie Schreur [place], [place] [phone])

## 2015-04-03 NOTE — Progress Notes (Signed)
OT Cancellation Note  Patient Details Name: Nicholas Elliott MRN: [id number] DOB: 1922/04/12   Cancelled Treatment:    Reason Eval/Treat Not Completed: Medical issues which prohibited therapy [place] No)  Parke Poisson B 04/03/2015, 9:48 AM

## 2015-04-03 NOTE — Progress Notes (Signed)
[facility] PROGRESS NOTE  Nicholas Elliott:[id number] DOB: 1921/09/23 DOA: Apr 11, 2015 PCP: [name], MD  Assessment/Plan:  Principal Problem:   Stroke, likely embolic. Discussed with Dr. [name].  Not a good anticoagulation candidate. Carotid Dopplers without significant stenosis. Echocardiogram pending.  Hemoglobin A1c pending. LDL 80.  Will likely need placement. Lives alone. Active Problems:   High cholesterol: On statin   CLL (chronic lymphocytic leukemia)   Left hip pain: will check x-ray rule out fracture   Forehead contusion   Left elbow contusion   Hyperglycemia:  No known history of diabetes.  HPI/Subjective: Complaining of left hip pain. No change in left-sided weakness. Reports chronic slurred speech from history of cleft palate.  Objective: Filed Vitals:   04/03/15 1000  BP: 139/60  Pulse: 90  Temp: 98.6 F (37 C)  Resp: 20    Intake/Output Summary (Last 24 hours) at 04/03/15 1422 Last data filed at 04/03/15 0320  Gross per 24 hour  Intake      0 ml  Output    200 ml  Net   -200 ml   Filed Weights   04-11-15 2005 04/11/15 2331  Weight: 77.111 kg (170 lb) 79.6 kg (175 lb 7.8 oz)    Exam:   General:  Elderly white male alert and appropriate.  HEEN T: Contusion over left brow.  Cardiovascular: Regular rate rhythm without murmurs gallops rubs  Respiratory: Clear to auscultation bilaterally without wheezes rhonchi or rales  Abdomen: Soft nontender nondistended  Ext: Left hip with contusion. Left elbow with ecchymoses. Left pretibial area with a dressing.  Neurologic: Left facial droop present. Left arm strength 1 out of 5. Left leg strength 3 out of 5.  Basic Metabolic Panel:  Recent Labs Lab Apr 11, 2015 1952 04/11/15 1958  NA 140 143  K 4.2 4.0  CL 107 106  CO2 24  --   GLUCOSE 209* 203*  BUN 21* 25*  CREATININE 0.97 0.80  CALCIUM 8.9  --    Liver Function Tests:  Recent Labs Lab 04/11/15 1952  AST 44*  ALT 22   ALKPHOS 48  BILITOT 0.6  PROT 6.2*  ALBUMIN 3.9   No results for input(s): LIPASE, AMYLASE in the last 168 hours. No results for input(s): AMMONIA in the last 168 hours. CBC:  Recent Labs Lab 04-11-2015 1952 04/11/2015 1958  WBC 33.7*  --   NEUTROABS 10.8*  --   HGB 14.4 16.3  HCT 43.9 48.0  MCV 98.0  --   PLT 126*  --    Cardiac Enzymes: No results for input(s): CKTOTAL, CKMB, CKMBINDEX, TROPONINI in the last 168 hours. BNP (last 3 results)  Recent Labs  04/03/15 0450  BNP 280.1*    ProBNP (last 3 results) No results for input(s): PROBNP in the last 8760 hours.  CBG:  Recent Labs Lab 04-11-15 1945  GLUCAP 190*    No results found for this or any previous visit (from the past 240 hour(s)).   Studies: Dg Wrist Complete Left  04-11-15   CLINICAL DATA:  Recent fall with left wrist pain, initial encounter  EXAM: LEFT WRIST - COMPLETE 3+ VIEW  COMPARISON:  None.  FINDINGS: Degenerative changes of the first [facility] joint are seen. Mild osteopenia is noted. No acute fracture or dislocation is noted.  IMPRESSION: No acute abnormality noted.   Electronically Signed   By: Inez Catalina M.D.   On: 2015/04/11 20:47   Ct Head Wo Contrast  2015/04/11   CLINICAL DATA:  Acute onset slurred speech, left-sided weakness, and left-sided facial droop. Fall onto floor. Head and neck injury. Facial pain and swelling. Initial encounter.  EXAM: CT HEAD WITHOUT CONTRAST  CT MAXILLOFACIAL WITHOUT CONTRAST  CT CERVICAL SPINE WITHOUT CONTRAST  TECHNIQUE: Multidetector CT imaging of the head, cervical spine, and maxillofacial structures were performed using the standard protocol without intravenous contrast. Multiplanar CT image reconstructions of the cervical spine and maxillofacial structures were also generated.  COMPARISON:  Head CT on 12/14/2012  FINDINGS: CT HEAD FINDINGS  There is no evidence of intracranial hemorrhage, brain edema, or other signs of acute infarction. There is no evidence of  intracranial mass lesion or mass effect. No abnormal extraaxial fluid collections are identified.  Mild cerebral atrophy and associated ventriculomegaly are stable. Mild chronic small vessel disease again demonstrated. Mild frontal scalp soft tissue swelling is noted. No evidence of skull fracture or pneumocephalus.  CT MAXILLOFACIAL FINDINGS  Left supraorbital and preseptal soft tissue swelling is seen. Globes and other intraorbital anatomy are normal in appearance.  No evidence of orbital or facial bone fracture. No evidence of orbital emphysema or sinus air-fluid levels.  CT CERVICAL SPINE FINDINGS  No evidence of acute fracture, subluxation, or prevertebral soft tissue swelling.  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  Moderate degenerative disc disease demonstrated at levels of C4-5, C6-7, and C7-T1. Mild to moderate bilateral facet DJD also noted. Advanced atlantoaxial degenerative changes also seen as well as mild cervical kyphoscoliosis.  IMPRESSION: Frontal scalp soft tissue swelling. No evidence of skull fracture or acute intracranial abnormality. Stable mild cerebral atrophy and chronic small vessel disease.  Left supraorbital and preseptal soft tissue swelling. No evidence of orbital or facial bone flexure.  No evidence of acute cervical spine fracture or subluxation. Degenerative spondylosis and congenital fusions, as described above.   Electronically Signed   By: Earle Gell M.D.   On: 04/02/2015 20:23   Ct Cervical Spine Wo Contrast  04/02/2015   CLINICAL DATA:  Acute onset slurred speech, left-sided weakness, and left-sided facial droop. Fall onto floor. Head and neck injury. Facial pain and swelling. Initial encounter.  EXAM: CT HEAD WITHOUT CONTRAST  CT MAXILLOFACIAL WITHOUT CONTRAST  CT CERVICAL SPINE WITHOUT CONTRAST  TECHNIQUE: Multidetector CT imaging of the head, cervical spine, and maxillofacial  structures were performed using the standard protocol without intravenous contrast. Multiplanar CT image reconstructions of the cervical spine and maxillofacial structures were also generated.  COMPARISON:  Head CT on 12/14/2012  FINDINGS: CT HEAD FINDINGS  There is no evidence of intracranial hemorrhage, brain edema, or other signs of acute infarction. There is no evidence of intracranial mass lesion or mass effect. No abnormal extraaxial fluid collections are identified.  Mild cerebral atrophy and associated ventriculomegaly are stable. Mild chronic small vessel disease again demonstrated. Mild frontal scalp soft tissue swelling is noted. No evidence of skull fracture or pneumocephalus.  CT MAXILLOFACIAL FINDINGS  Left supraorbital and preseptal soft tissue swelling is seen. Globes and other intraorbital anatomy are normal in appearance.  No evidence of orbital or facial bone fracture. No evidence of orbital emphysema or sinus air-fluid levels.  CT CERVICAL SPINE FINDINGS  No evidence of acute fracture, subluxation, or prevertebral soft tissue swelling.  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  Moderate degenerative disc  disease demonstrated at levels of C4-5, C6-7, and C7-T1. Mild to moderate bilateral facet DJD also noted. Advanced atlantoaxial degenerative changes also seen as well as mild cervical kyphoscoliosis.  IMPRESSION: Frontal scalp soft tissue swelling. No evidence of skull fracture or acute intracranial abnormality. Stable mild cerebral atrophy and chronic small vessel disease.  Left supraorbital and preseptal soft tissue swelling. No evidence of orbital or facial bone flexure.  No evidence of acute cervical spine fracture or subluxation. Degenerative spondylosis and congenital fusions, as described above.   Electronically Signed   By: Earle Gell M.D.   On: 04/02/2015 20:23   Mr Jodene Nam Head Wo  Contrast  04/02/2015   CLINICAL DATA:  Initial evaluation for left-sided deficit, slurred speech.  EXAM: MRI HEAD WITHOUT CONTRAST  MRA HEAD WITHOUT CONTRAST  TECHNIQUE: Multiplanar, multiecho pulse sequences of the brain and surrounding structures were obtained without intravenous contrast. Angiographic images of the head were obtained using MRA technique without contrast.  COMPARISON:  Prior CT from earlier the same day.  FINDINGS: MRI HEAD FINDINGS  Diffuse prominence of the CSF containing spaces is compatible with generalized cerebral atrophy. No significant white matter disease present for patient age. Small remote bilateral cerebellar infarcts present.  There are patchy multi focal wall predominantly cortical areas of ischemic infarction involving the right MCA territory. These involve the right insular cortex, operculum, and posterior right frontal region, including the right in precentral gyrus. Associated gyral swelling and edema on T2/FLAIR weighted sequences. No significant mass effect. There is associated petechial hemorrhage within the insular cortex (series 9, image 15). No evidence for frank hemorrhagic transformation. No other infarct. Intravascular flow voids maintained.  No mass lesion or midline shift. No mass effect. Ventricular prominence related to global parenchymal volume loss present without hydrocephalus. No extra-axial fluid collection.  Craniocervical junction within normal limits. Degenerative changes present about the C1-2 articulation. Pituitary gland normal.  No acute abnormality about the orbits. Globes are directed to the right. Sequelae of prior bilateral lens extraction noted.  Small amount of layering opacity within the right sphenoid sinus. Mild mucosal thickening within the maxillary sinuses bilaterally, right greater than left. Minimal mucosal thickening within the ethmoidal air cells as well. The scattered opacity present within the bilateral mastoid air cells. Inner ear  structures within normal limits.  Bone marrow signal intensity normal. Scalp edema present within the left parietal scalp. Edema also noted within the left periorbital region.  MRA HEAD FINDINGS  ANTERIOR CIRCULATION:  Visualized distal cervical segments of the internal carotid arteries are widely patent with antegrade flow. The petrous, cavernous, and supra clinoid segments are widely patent as well. A1 segments, anterior communicating artery, and anterior cerebral arteries well opacified.  Right M1 segment widely patent without stenosis or occlusion. Right MCA bifurcation normal. Short-segment moderate stenosis within a proximal right M2 branch at the base of the right sylvian fissure (series 603, image 6 3). No proximal right M2 branch occlusion. Right MCA branches opacified distally.  There is focal moderate to severe stenosis within the distal left M1 segment just prior to the left MCA bifurcation. This stenosis measures approximately 4-5 mm in length. MCA bifurcation itself is within normal limits. Distal left MCA branches well opacified.  POSTERIOR CIRCULATION:  Vertebral arteries are widely patent to the vertebrobasilar junction. Right vertebral artery is dominant. Posterior inferior cerebellar arteries well opacified. There is a short-segment moderate stenosis within the proximal basilar artery (series 605, image 7). This stenosis measures approximately 3 mm in length.  Basilar artery widely patent distally. No basilar tip aneurysm or stenosis. Superior cerebellar arteries patent proximally. Both posterior cerebral arteries arise from the basilar artery. Posterior cerebral arteries are opacified to their distal aspects. A small right posterior communicating artery present.  No aneurysm or vascular malformation.  IMPRESSION: MRI HEAD IMPRESSION:  1. Patchy multi focal cortical right MCA territory infarcts as above. There is associated petechial hemorrhage within the right insular cortex without evidence of  hemorrhagic transformation. 2. Age-related cerebral atrophy with small remote bilateral cerebellar infarcts.  MRA HEAD IMPRESSION:  1. No large vessel or proximal branch occlusion identified. 2. Short segment moderate stenosis within the proximal right M2 branch at the base of the sylvian fissure. 3. Focal moderate to severe stenosis of the distal left M1 segment. 4. Short-segment moderate stenosis within the proximal basilar artery   Electronically Signed   By: Jeannine Boga M.D.   On: 04/02/2015 23:48   Mr Brain Wo Contrast  04/02/2015   CLINICAL DATA:  Initial evaluation for left-sided deficit, slurred speech.  EXAM: MRI HEAD WITHOUT CONTRAST  MRA HEAD WITHOUT CONTRAST  TECHNIQUE: Multiplanar, multiecho pulse sequences of the brain and surrounding structures were obtained without intravenous contrast. Angiographic images of the head were obtained using MRA technique without contrast.  COMPARISON:  Prior CT from earlier the same day.  FINDINGS: MRI HEAD FINDINGS  Diffuse prominence of the CSF containing spaces is compatible with generalized cerebral atrophy. No significant white matter disease present for patient age. Small remote bilateral cerebellar infarcts present.  There are patchy multi focal wall predominantly cortical areas of ischemic infarction involving the right MCA territory. These involve the right insular cortex, operculum, and posterior right frontal region, including the right in precentral gyrus. Associated gyral swelling and edema on T2/FLAIR weighted sequences. No significant mass effect. There is associated petechial hemorrhage within the insular cortex (series 9, image 15). No evidence for frank hemorrhagic transformation. No other infarct. Intravascular flow voids maintained.  No mass lesion or midline shift. No mass effect. Ventricular prominence related to global parenchymal volume loss present without hydrocephalus. No extra-axial fluid collection.  Craniocervical junction  within normal limits. Degenerative changes present about the C1-2 articulation. Pituitary gland normal.  No acute abnormality about the orbits. Globes are directed to the right. Sequelae of prior bilateral lens extraction noted.  Small amount of layering opacity within the right sphenoid sinus. Mild mucosal thickening within the maxillary sinuses bilaterally, right greater than left. Minimal mucosal thickening within the ethmoidal air cells as well. The scattered opacity present within the bilateral mastoid air cells. Inner ear structures within normal limits.  Bone marrow signal intensity normal. Scalp edema present within the left parietal scalp. Edema also noted within the left periorbital region.  MRA HEAD FINDINGS  ANTERIOR CIRCULATION:  Visualized distal cervical segments of the internal carotid arteries are widely patent with antegrade flow. The petrous, cavernous, and supra clinoid segments are widely patent as well. A1 segments, anterior communicating artery, and anterior cerebral arteries well opacified.  Right M1 segment widely patent without stenosis or occlusion. Right MCA bifurcation normal. Short-segment moderate stenosis within a proximal right M2 branch at the base of the right sylvian fissure (series 603, image 6 3). No proximal right M2 branch occlusion. Right MCA branches opacified distally.  There is focal moderate to severe stenosis within the distal left M1 segment just prior to the left MCA bifurcation. This stenosis measures approximately 4-5 mm in length. MCA bifurcation itself is within normal limits.  Distal left MCA branches well opacified.  POSTERIOR CIRCULATION:  Vertebral arteries are widely patent to the vertebrobasilar junction. Right vertebral artery is dominant. Posterior inferior cerebellar arteries well opacified. There is a short-segment moderate stenosis within the proximal basilar artery (series 605, image 7). This stenosis measures approximately 3 mm in length. Basilar artery  widely patent distally. No basilar tip aneurysm or stenosis. Superior cerebellar arteries patent proximally. Both posterior cerebral arteries arise from the basilar artery. Posterior cerebral arteries are opacified to their distal aspects. A small right posterior communicating artery present.  No aneurysm or vascular malformation.  IMPRESSION: MRI HEAD IMPRESSION:  1. Patchy multi focal cortical right MCA territory infarcts as above. There is associated petechial hemorrhage within the right insular cortex without evidence of hemorrhagic transformation. 2. Age-related cerebral atrophy with small remote bilateral cerebellar infarcts.  MRA HEAD IMPRESSION:  1. No large vessel or proximal branch occlusion identified. 2. Short segment moderate stenosis within the proximal right M2 branch at the base of the sylvian fissure. 3. Focal moderate to severe stenosis of the distal left M1 segment. 4. Short-segment moderate stenosis within the proximal basilar artery   Electronically Signed   By: Jeannine Boga M.D.   On: 04/02/2015 23:48   Ct Maxillofacial Wo Cm  04/02/2015   CLINICAL DATA:  Acute onset slurred speech, left-sided weakness, and left-sided facial droop. Fall onto floor. Head and neck injury. Facial pain and swelling. Initial encounter.  EXAM: CT HEAD WITHOUT CONTRAST  CT MAXILLOFACIAL WITHOUT CONTRAST  CT CERVICAL SPINE WITHOUT CONTRAST  TECHNIQUE: Multidetector CT imaging of the head, cervical spine, and maxillofacial structures were performed using the standard protocol without intravenous contrast. Multiplanar CT image reconstructions of the cervical spine and maxillofacial structures were also generated.  COMPARISON:  Head CT on 12/14/2012  FINDINGS: CT HEAD FINDINGS  There is no evidence of intracranial hemorrhage, brain edema, or other signs of acute infarction. There is no evidence of intracranial mass lesion or mass effect. No abnormal extraaxial fluid collections are identified.  Mild cerebral  atrophy and associated ventriculomegaly are stable. Mild chronic small vessel disease again demonstrated. Mild frontal scalp soft tissue swelling is noted. No evidence of skull fracture or pneumocephalus.  CT MAXILLOFACIAL FINDINGS  Left supraorbital and preseptal soft tissue swelling is seen. Globes and other intraorbital anatomy are normal in appearance.  No evidence of orbital or facial bone fracture. No evidence of orbital emphysema or sinus air-fluid levels.  CT CERVICAL SPINE FINDINGS  No evidence of acute fracture, subluxation, or prevertebral soft tissue swelling.  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  Moderate degenerative disc disease demonstrated at levels of C4-5, C6-7, and C7-T1. Mild to moderate bilateral facet DJD also noted. Advanced atlantoaxial degenerative changes also seen as well as mild cervical kyphoscoliosis.  IMPRESSION: Frontal scalp soft tissue swelling. No evidence of skull fracture or acute intracranial abnormality. Stable mild cerebral atrophy and chronic small vessel disease.  Left supraorbital and preseptal soft tissue swelling. No evidence of orbital or facial bone flexure.  No evidence of acute cervical spine fracture or subluxation. Degenerative spondylosis and congenital fusions, as described above.   Electronically Signed   By: Earle Gell M.D.   On: 04/02/2015 20:23    Scheduled Meds: . acidophilus  1 capsule Oral Daily  . aspirin EC  81 mg Oral Daily  . clopidogrel  75 mg Oral Daily  .  digoxin  0.125 mg Oral Daily  . gabapentin  300 mg Oral TID  . heparin  5,000 Units Subcutaneous 3 times per day  . multivitamin with minerals  1 tablet Oral Daily  . rosuvastatin  20 mg Oral Daily  . vitamin B-12  100 mcg Oral Daily   Continuous Infusions:   Time spent: 25 minutes  [facility] [email or web address], password [email or web address] 04/03/2015, 2:22 PM  LOS: 1 day

## 2015-04-04 ENCOUNTER — Ambulatory Visit (HOSPITAL_COMMUNITY): Payer: Medicare PPO

## 2015-04-04 DIAGNOSIS — R079 Chest pain, unspecified: Secondary | ICD-10-CM

## 2015-04-04 LAB — HEMOGLOBIN A1C
Hgb A1c MFr Bld: 6.1 % — ABNORMAL HIGH (ref 4.8–5.6)
Mean Plasma Glucose: 128 mg/dL

## 2015-04-04 LAB — GLUCOSE, CAPILLARY: Glucose-Capillary: 83 mg/dL (ref 65–99)

## 2015-04-04 MED ORDER — ACETAMINOPHEN 325 MG PO TABS: 650.0000 mg | ORAL_TABLET | Freq: Four times a day (QID) | ORAL | Status: DC | PRN

## 2015-04-04 NOTE — Progress Notes (Signed)
SLP Cancellation Note  Patient Details Name: Nicholas Elliott MRN: [id number] DOB: 1922/01/29   Cancelled treatment:       Reason Eval/Treat Not Completed: Patient at procedure or test/unavailable. ( RN reported that pt tolerated meals well today)   [name] 04/04/2015, 3:57 PM

## 2015-04-04 NOTE — Progress Notes (Signed)
[facility] PROGRESS NOTE  Nicholas Elliott TML:[id number] DOB: 12-Oct-1921 DOA: 04/08/2015 PCP: [name], MD  Assessment/Plan:  Principal Problem:   Stroke, likely embolic. Discussed with Dr. [name].  Not a good anticoagulation candidate. Carotid Dopplers without significant stenosis. Echocardiogram pending.  Hemoglobin A1c pending. LDL 80.  Cir v. snf. Active Problems:   High cholesterol: On statin   CLL (chronic lymphocytic leukemia)   Left hip pain: xray ok   Forehead contusion   Left elbow contusion   Hyperglycemia:  No known history of diabetes. Left chest contusion  HPI/Subjective: Now c/o left side of trunk.  Objective: Filed Vitals:   04/04/15 1100  BP: 138/67  Pulse: 77  Temp: 97.7 F (36.5 C)  Resp: 18    Intake/Output Summary (Last 24 hours) at 04/04/15 1431 Last data filed at 04/04/15 0830  Gross per 24 hour  Intake    240 ml  Output      0 ml  Net    240 ml   Filed Weights   04-08-15 2005 2015/04/08 2331  Weight: 77.111 kg (170 lb) 79.6 kg (175 lb 7.8 oz)    Exam:   General:  Elderly white male alert and appropriate.  HEEN T: Contusion over left brow. Swelling going down  Cardiovascular: Regular rate rhythm without murmurs gallops rubs  Respiratory: Clear to auscultation bilaterally without wheezes rhonchi or rales  Abdomen: Soft nontender nondistended  Ext: Left hip with contusion. Left elbow with ecchymoses. Left pretibial area with a dressing.  Neurologic: Left facial droop present. Left arm strength 1 out of 5. Left leg strength 3 out of 5.  Basic Metabolic Panel:  Recent Labs Lab 04-08-15 1952 04/08/2015 1958  NA 140 143  K 4.2 4.0  CL 107 106  CO2 24  --   GLUCOSE 209* 203*  BUN 21* 25*  CREATININE 0.97 0.80  CALCIUM 8.9  --    Liver Function Tests:  Recent Labs Lab 04-08-2015 1952  AST 44*  ALT 22  ALKPHOS 48  BILITOT 0.6  PROT 6.2*  ALBUMIN 3.9   No results for input(s): LIPASE, AMYLASE in the last 168  hours. No results for input(s): AMMONIA in the last 168 hours. CBC:  Recent Labs Lab 04/08/2015 1952 04/08/15 1958  WBC 33.7*  --   NEUTROABS 10.8*  --   HGB 14.4 16.3  HCT 43.9 48.0  MCV 98.0  --   PLT 126*  --    Cardiac Enzymes: No results for input(s): CKTOTAL, CKMB, CKMBINDEX, TROPONINI in the last 168 hours. BNP (last 3 results)  Recent Labs  04/03/15 0450  BNP 280.1*    ProBNP (last 3 results) No results for input(s): PROBNP in the last 8760 hours.  CBG:  Recent Labs Lab 2015-04-08 1945 04/04/15 0616  GLUCAP 190* 83    No results found for this or any previous visit (from the past 240 hour(s)).   Studies: Dg Wrist Complete Left  04/08/2015   CLINICAL DATA:  Recent fall with left wrist pain, initial encounter  EXAM: LEFT WRIST - COMPLETE 3+ VIEW  COMPARISON:  None.  FINDINGS: Degenerative changes of the first [facility] joint are seen. Mild osteopenia is noted. No acute fracture or dislocation is noted.  IMPRESSION: No acute abnormality noted.   Electronically Signed   By: Inez Catalina M.D.   On: 08-Apr-2015 20:47   Ct Head Wo Contrast  2015/04/08   CLINICAL DATA:  Acute onset slurred speech, left-sided weakness, and left-sided facial droop.  Fall onto floor. Head and neck injury. Facial pain and swelling. Initial encounter.  EXAM: CT HEAD WITHOUT CONTRAST  CT MAXILLOFACIAL WITHOUT CONTRAST  CT CERVICAL SPINE WITHOUT CONTRAST  TECHNIQUE: Multidetector CT imaging of the head, cervical spine, and maxillofacial structures were performed using the standard protocol without intravenous contrast. Multiplanar CT image reconstructions of the cervical spine and maxillofacial structures were also generated.  COMPARISON:  Head CT on 12/14/2012  FINDINGS: CT HEAD FINDINGS  There is no evidence of intracranial hemorrhage, brain edema, or other signs of acute infarction. There is no evidence of intracranial mass lesion or mass effect. No abnormal extraaxial fluid collections are identified.   Mild cerebral atrophy and associated ventriculomegaly are stable. Mild chronic small vessel disease again demonstrated. Mild frontal scalp soft tissue swelling is noted. No evidence of skull fracture or pneumocephalus.  CT MAXILLOFACIAL FINDINGS  Left supraorbital and preseptal soft tissue swelling is seen. Globes and other intraorbital anatomy are normal in appearance.  No evidence of orbital or facial bone fracture. No evidence of orbital emphysema or sinus air-fluid levels.  CT CERVICAL SPINE FINDINGS  No evidence of acute fracture, subluxation, or prevertebral soft tissue swelling.  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  Moderate degenerative disc disease demonstrated at levels of C4-5, C6-7, and C7-T1. Mild to moderate bilateral facet DJD also noted. Advanced atlantoaxial degenerative changes also seen as well as mild cervical kyphoscoliosis.  IMPRESSION: Frontal scalp soft tissue swelling. No evidence of skull fracture or acute intracranial abnormality. Stable mild cerebral atrophy and chronic small vessel disease.  Left supraorbital and preseptal soft tissue swelling. No evidence of orbital or facial bone flexure.  No evidence of acute cervical spine fracture or subluxation. Degenerative spondylosis and congenital fusions, as described above.   Electronically Signed   By: Earle Gell M.D.   On: 04/02/2015 20:23   Ct Cervical Spine Wo Contrast  04/02/2015   CLINICAL DATA:  Acute onset slurred speech, left-sided weakness, and left-sided facial droop. Fall onto floor. Head and neck injury. Facial pain and swelling. Initial encounter.  EXAM: CT HEAD WITHOUT CONTRAST  CT MAXILLOFACIAL WITHOUT CONTRAST  CT CERVICAL SPINE WITHOUT CONTRAST  TECHNIQUE: Multidetector CT imaging of the head, cervical spine, and maxillofacial structures were performed using the standard protocol without intravenous contrast. Multiplanar CT image  reconstructions of the cervical spine and maxillofacial structures were also generated.  COMPARISON:  Head CT on 12/14/2012  FINDINGS: CT HEAD FINDINGS  There is no evidence of intracranial hemorrhage, brain edema, or other signs of acute infarction. There is no evidence of intracranial mass lesion or mass effect. No abnormal extraaxial fluid collections are identified.  Mild cerebral atrophy and associated ventriculomegaly are stable. Mild chronic small vessel disease again demonstrated. Mild frontal scalp soft tissue swelling is noted. No evidence of skull fracture or pneumocephalus.  CT MAXILLOFACIAL FINDINGS  Left supraorbital and preseptal soft tissue swelling is seen. Globes and other intraorbital anatomy are normal in appearance.  No evidence of orbital or facial bone fracture. No evidence of orbital emphysema or sinus air-fluid levels.  CT CERVICAL SPINE FINDINGS  No evidence of acute fracture, subluxation, or prevertebral soft tissue swelling.  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  Moderate degenerative disc disease demonstrated at levels of C4-5, C6-7, and C7-T1. Mild  to moderate bilateral facet DJD also noted. Advanced atlantoaxial degenerative changes also seen as well as mild cervical kyphoscoliosis.  IMPRESSION: Frontal scalp soft tissue swelling. No evidence of skull fracture or acute intracranial abnormality. Stable mild cerebral atrophy and chronic small vessel disease.  Left supraorbital and preseptal soft tissue swelling. No evidence of orbital or facial bone flexure.  No evidence of acute cervical spine fracture or subluxation. Degenerative spondylosis and congenital fusions, as described above.   Electronically Signed   By: Earle Gell M.D.   On: 04/02/2015 20:23   Mr Jodene Nam Head Wo Contrast  04/02/2015   CLINICAL DATA:  Initial evaluation for left-sided deficit, slurred speech.  EXAM: MRI HEAD WITHOUT  CONTRAST  MRA HEAD WITHOUT CONTRAST  TECHNIQUE: Multiplanar, multiecho pulse sequences of the brain and surrounding structures were obtained without intravenous contrast. Angiographic images of the head were obtained using MRA technique without contrast.  COMPARISON:  Prior CT from earlier the same day.  FINDINGS: MRI HEAD FINDINGS  Diffuse prominence of the CSF containing spaces is compatible with generalized cerebral atrophy. No significant white matter disease present for patient age. Small remote bilateral cerebellar infarcts present.  There are patchy multi focal wall predominantly cortical areas of ischemic infarction involving the right MCA territory. These involve the right insular cortex, operculum, and posterior right frontal region, including the right in precentral gyrus. Associated gyral swelling and edema on T2/FLAIR weighted sequences. No significant mass effect. There is associated petechial hemorrhage within the insular cortex (series 9, image 15). No evidence for frank hemorrhagic transformation. No other infarct. Intravascular flow voids maintained.  No mass lesion or midline shift. No mass effect. Ventricular prominence related to global parenchymal volume loss present without hydrocephalus. No extra-axial fluid collection.  Craniocervical junction within normal limits. Degenerative changes present about the C1-2 articulation. Pituitary gland normal.  No acute abnormality about the orbits. Globes are directed to the right. Sequelae of prior bilateral lens extraction noted.  Small amount of layering opacity within the right sphenoid sinus. Mild mucosal thickening within the maxillary sinuses bilaterally, right greater than left. Minimal mucosal thickening within the ethmoidal air cells as well. The scattered opacity present within the bilateral mastoid air cells. Inner ear structures within normal limits.  Bone marrow signal intensity normal. Scalp edema present within the left parietal scalp.  Edema also noted within the left periorbital region.  MRA HEAD FINDINGS  ANTERIOR CIRCULATION:  Visualized distal cervical segments of the internal carotid arteries are widely patent with antegrade flow. The petrous, cavernous, and supra clinoid segments are widely patent as well. A1 segments, anterior communicating artery, and anterior cerebral arteries well opacified.  Right M1 segment widely patent without stenosis or occlusion. Right MCA bifurcation normal. Short-segment moderate stenosis within a proximal right M2 branch at the base of the right sylvian fissure (series 603, image 6 3). No proximal right M2 branch occlusion. Right MCA branches opacified distally.  There is focal moderate to severe stenosis within the distal left M1 segment just prior to the left MCA bifurcation. This stenosis measures approximately 4-5 mm in length. MCA bifurcation itself is within normal limits. Distal left MCA branches well opacified.  POSTERIOR CIRCULATION:  Vertebral arteries are widely patent to the vertebrobasilar junction. Right vertebral artery is dominant. Posterior inferior cerebellar arteries well opacified. There is a short-segment moderate stenosis within the proximal basilar artery (series 605, image 7). This stenosis measures approximately 3 mm in length. Basilar artery widely patent distally. No basilar tip aneurysm or  stenosis. Superior cerebellar arteries patent proximally. Both posterior cerebral arteries arise from the basilar artery. Posterior cerebral arteries are opacified to their distal aspects. A small right posterior communicating artery present.  No aneurysm or vascular malformation.  IMPRESSION: MRI HEAD IMPRESSION:  1. Patchy multi focal cortical right MCA territory infarcts as above. There is associated petechial hemorrhage within the right insular cortex without evidence of hemorrhagic transformation. 2. Age-related cerebral atrophy with small remote bilateral cerebellar infarcts.  MRA HEAD  IMPRESSION:  1. No large vessel or proximal branch occlusion identified. 2. Short segment moderate stenosis within the proximal right M2 branch at the base of the sylvian fissure. 3. Focal moderate to severe stenosis of the distal left M1 segment. 4. Short-segment moderate stenosis within the proximal basilar artery   Electronically Signed   By: Jeannine Boga M.D.   On: 04/02/2015 23:48   Mr Brain Wo Contrast  04/02/2015   CLINICAL DATA:  Initial evaluation for left-sided deficit, slurred speech.  EXAM: MRI HEAD WITHOUT CONTRAST  MRA HEAD WITHOUT CONTRAST  TECHNIQUE: Multiplanar, multiecho pulse sequences of the brain and surrounding structures were obtained without intravenous contrast. Angiographic images of the head were obtained using MRA technique without contrast.  COMPARISON:  Prior CT from earlier the same day.  FINDINGS: MRI HEAD FINDINGS  Diffuse prominence of the CSF containing spaces is compatible with generalized cerebral atrophy. No significant white matter disease present for patient age. Small remote bilateral cerebellar infarcts present.  There are patchy multi focal wall predominantly cortical areas of ischemic infarction involving the right MCA territory. These involve the right insular cortex, operculum, and posterior right frontal region, including the right in precentral gyrus. Associated gyral swelling and edema on T2/FLAIR weighted sequences. No significant mass effect. There is associated petechial hemorrhage within the insular cortex (series 9, image 15). No evidence for frank hemorrhagic transformation. No other infarct. Intravascular flow voids maintained.  No mass lesion or midline shift. No mass effect. Ventricular prominence related to global parenchymal volume loss present without hydrocephalus. No extra-axial fluid collection.  Craniocervical junction within normal limits. Degenerative changes present about the C1-2 articulation. Pituitary gland normal.  No acute  abnormality about the orbits. Globes are directed to the right. Sequelae of prior bilateral lens extraction noted.  Small amount of layering opacity within the right sphenoid sinus. Mild mucosal thickening within the maxillary sinuses bilaterally, right greater than left. Minimal mucosal thickening within the ethmoidal air cells as well. The scattered opacity present within the bilateral mastoid air cells. Inner ear structures within normal limits.  Bone marrow signal intensity normal. Scalp edema present within the left parietal scalp. Edema also noted within the left periorbital region.  MRA HEAD FINDINGS  ANTERIOR CIRCULATION:  Visualized distal cervical segments of the internal carotid arteries are widely patent with antegrade flow. The petrous, cavernous, and supra clinoid segments are widely patent as well. A1 segments, anterior communicating artery, and anterior cerebral arteries well opacified.  Right M1 segment widely patent without stenosis or occlusion. Right MCA bifurcation normal. Short-segment moderate stenosis within a proximal right M2 branch at the base of the right sylvian fissure (series 603, image 6 3). No proximal right M2 branch occlusion. Right MCA branches opacified distally.  There is focal moderate to severe stenosis within the distal left M1 segment just prior to the left MCA bifurcation. This stenosis measures approximately 4-5 mm in length. MCA bifurcation itself is within normal limits. Distal left MCA branches well opacified.  POSTERIOR CIRCULATION:  Vertebral arteries are widely patent to the vertebrobasilar junction. Right vertebral artery is dominant. Posterior inferior cerebellar arteries well opacified. There is a short-segment moderate stenosis within the proximal basilar artery (series 605, image 7). This stenosis measures approximately 3 mm in length. Basilar artery widely patent distally. No basilar tip aneurysm or stenosis. Superior cerebellar arteries patent proximally. Both  posterior cerebral arteries arise from the basilar artery. Posterior cerebral arteries are opacified to their distal aspects. A small right posterior communicating artery present.  No aneurysm or vascular malformation.  IMPRESSION: MRI HEAD IMPRESSION:  1. Patchy multi focal cortical right MCA territory infarcts as above. There is associated petechial hemorrhage within the right insular cortex without evidence of hemorrhagic transformation. 2. Age-related cerebral atrophy with small remote bilateral cerebellar infarcts.  MRA HEAD IMPRESSION:  1. No large vessel or proximal branch occlusion identified. 2. Short segment moderate stenosis within the proximal right M2 branch at the base of the sylvian fissure. 3. Focal moderate to severe stenosis of the distal left M1 segment. 4. Short-segment moderate stenosis within the proximal basilar artery   Electronically Signed   By: Jeannine Boga M.D.   On: 04/02/2015 23:48   Dg Hip Unilat With Pelvis Min 4 Views Left  04/03/2015   CLINICAL DATA:  Fall with complaints of left hip pain no obvious bruising or abrasions pt is difficult to understand and appears to have weakness in left side. C/o chiefly of neck pain.  EXAM: DG HIP (WITH OR WITHOUT PELVIS) 4+V LEFT  COMPARISON:  None.  FINDINGS: There is no evidence of hip fracture or dislocation. There is no evidence of arthropathy or other focal bone abnormality.  IMPRESSION: Negative.   Electronically Signed   By: Lajean Manes M.D.   On: 04/03/2015 17:16   Ct Maxillofacial Wo Cm  04/02/2015   CLINICAL DATA:  Acute onset slurred speech, left-sided weakness, and left-sided facial droop. Fall onto floor. Head and neck injury. Facial pain and swelling. Initial encounter.  EXAM: CT HEAD WITHOUT CONTRAST  CT MAXILLOFACIAL WITHOUT CONTRAST  CT CERVICAL SPINE WITHOUT CONTRAST  TECHNIQUE: Multidetector CT imaging of the head, cervical spine, and maxillofacial structures were performed using the standard protocol without  intravenous contrast. Multiplanar CT image reconstructions of the cervical spine and maxillofacial structures were also generated.  COMPARISON:  Head CT on 12/14/2012  FINDINGS: CT HEAD FINDINGS  There is no evidence of intracranial hemorrhage, brain edema, or other signs of acute infarction. There is no evidence of intracranial mass lesion or mass effect. No abnormal extraaxial fluid collections are identified.  Mild cerebral atrophy and associated ventriculomegaly are stable. Mild chronic small vessel disease again demonstrated. Mild frontal scalp soft tissue swelling is noted. No evidence of skull fracture or pneumocephalus.  CT MAXILLOFACIAL FINDINGS  Left supraorbital and preseptal soft tissue swelling is seen. Globes and other intraorbital anatomy are normal in appearance.  No evidence of orbital or facial bone fracture. No evidence of orbital emphysema or sinus air-fluid levels.  CT CERVICAL SPINE FINDINGS  No evidence of acute fracture, subluxation, or prevertebral soft tissue swelling.  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  Moderate degenerative disc disease demonstrated at levels of C4-5, C6-7, and C7-T1. Mild to moderate bilateral facet DJD also noted. Advanced atlantoaxial degenerative changes also seen as well as mild cervical kyphoscoliosis.  IMPRESSION: Frontal scalp soft tissue swelling. No evidence of skull fracture  or acute intracranial abnormality. Stable mild cerebral atrophy and chronic small vessel disease.  Left supraorbital and preseptal soft tissue swelling. No evidence of orbital or facial bone flexure.  No evidence of acute cervical spine fracture or subluxation. Degenerative spondylosis and congenital fusions, as described above.   Electronically Signed   By: Earle Gell M.D.   On: 04/02/2015 20:23    Scheduled Meds: . acidophilus  1 capsule Oral Daily  . aspirin EC  81 mg Oral Daily  .  clopidogrel  75 mg Oral Daily  . digoxin  0.125 mg Oral Daily  . gabapentin  300 mg Oral TID  . heparin  5,000 Units Subcutaneous 3 times per day  . multivitamin with minerals  1 tablet Oral Daily  . rosuvastatin  20 mg Oral Daily  . vitamin B-12  100 mcg Oral Daily   Continuous Infusions:   Time spent: 25 minutes  [facility] [email or web address], password [facility] 04/04/2015, 2:31 PM  LOS: 2 days

## 2015-04-04 NOTE — Consult Note (Signed)
Physical Medicine and Rehabilitation Consult   Reason for Consult: Left sided weakness, left inattention, slurred speech and dysphagia Referring Physician: Dr. Allyson Sabal   HPI: Nicholas Elliott is a 81 y.o. RH-male with history of Migraines, CLL, TIAs, DDD who was admitted on 04/02/15 after being found down by friend with left sided weakness, slurred speech and inability to walk.   UDS negative. MRI/MRA brain with patchy multifocal cortical R-MCA infarcts with petechial hemorrhagic within right insular cortex, focal to severe stenosis distal left M1 segment and short segment moderate stenosis proximal BA. Carotid dopplers without significant ICA stenosis. Dr. [name] evaluated patient and recommends continuing plavix for secondary stroke prevention as not long term candidate for anticoagulation due to falls. Swallow evaluation with overt s/s of aspiration with thins/nectar and patient placed on dysphagia 3, honey liquids. Cognitive evaluation with deficits in STM, insight and problem solving. Lethargy resolving and PT evaluation done today. Patient with resultant left hemiparesis, left inattention, mild dysarthria, dysphagia and mild cognitive deficits. CIR recommended by MD and Rehab team for follow up therapy.    Review of Systems  HENT: Positive for hearing loss.   Eyes: Negative for blurred vision and double vision.  Respiratory: Negative for cough and shortness of breath.   Cardiovascular: Negative for chest pain, palpitations and leg swelling.  Gastrointestinal: Negative for heartburn and vomiting.  Musculoskeletal: Positive for myalgias and neck pain.  Neurological: Positive for dizziness (with head turns to the left.), sensory change, focal weakness and headaches (all over). Negative for speech change (denies change in speech pattern).  Psychiatric/Behavioral: The patient is not nervous/anxious and does not have insomnia.       Past Medical History  Diagnosis Date  . High  cholesterol   . TIA (transient ischemic attack)   . Arthritis   . Glaucoma   . Migraine   . CLL (chronic lymphocytic leukemia)     Past Surgical History  Procedure Laterality Date  . Upper palate reparin    . Spine surgery      Family History  Problem Relation Age of Onset  . Stroke Father   . Cancer Sister   . Heart attack Brother     Social History:  Married but wife lives in [place]. He lives alone and was independent without AD PTA. 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 He reports that he has never smoked. He does not have any smokeless tobacco history on file. He reports that he does not drink alcohol or use illicit drugs.   Allergies: No Known Allergies    Medications Prior to Admission  Medication Sig Dispense Refill  . albuterol (PROAIR HFA) 108 (90 BASE) MCG/ACT inhaler Inhale into the lungs every 6 (six) hours as needed for wheezing or shortness of breath.     . furosemide (LASIX) 20 MG tablet Take 10 mg by mouth daily.    Marland Kitchen HYDROcodone-acetaminophen (NORCO/VICODIN) 5-325 MG per tablet Take 1 tablet by mouth every 6 (six) hours as needed for moderate pain.    . rosuvastatin (CRESTOR) 20 MG tablet Take 20 mg by mouth at bedtime.    Marland Kitchen aspirin EC 81 MG tablet Take 81 mg by mouth daily.    . clopidogrel (PLAVIX) 75 MG tablet Take 75 mg by mouth at bedtime.     . digoxin (LANOXIN) 0.125 MG tablet Take 0.125 mg by mouth daily.     . DULoxetine (CYMBALTA) 30 MG capsule Take  30 mg by mouth daily.    Marland Kitchen gabapentin (NEURONTIN) 400 MG capsule Take 400 mg by mouth 2 (two) times daily.    . Misc Natural Products (OSTEO BI-FLEX JOINT SHIELD) TABS Take 1 tablet by mouth 2 (two) times daily.    . Multiple Vitamins-Minerals (CENTRUM SILVER ADULT 50+ PO) Take by mouth daily.    . simethicone (PHAZYME) 125 MG chewable tablet Chew 125 mg by mouth 2 (two) times daily.      Home: Home Living Family/patient expects to be discharged to::  Inpatient rehab Living Arrangements: Alone Additional Comments: Pt reports he lives separately from his wife.  Has ramp to enter 1 story home  Lives With: Alone  Functional History: Prior Function Level of Independence: Independent Comments: Pt reports he was Ind PTA w/ ADLs and mobility.  Has friend who is an Therapist, sports who comes to visit from time to time but she does not assist him.  Has WC (his wife's) and RW.   Functional Status:  Mobility: Bed Mobility Overal bed mobility: Needs Assistance, + 2 for safety/equipment Bed Mobility: Supine to Sit Supine to sit: Min assist, HOB elevated, +2 for safety/equipment General bed mobility comments: Min assist pushing up from L sidelying.  Pt demonstrates ability to push up through L elbow.  Ues of bed rail and increased time.  Cues for technique to push through RUE to scoot hips toward EOB w/ increased time. Transfers Overall transfer level: Needs assistance Equipment used: 2 person hand held assist Transfers: Sit to/from Stand Sit to Stand: Min assist, +2 physical assistance General transfer comment: Min +2 assist for sit>stand for balance, min +2 assist for stand>sit for balance and to assist w/ proper hand positioning on arm rests.  Cues for technique to lean forward during standing. Ambulation/Gait Ambulation/Gait assistance: Mod assist, +2 safety/equipment Ambulation Distance (Feet): 70 Feet Assistive device: 2 person hand held assist, 1 person hand held assist Gait Pattern/deviations: Step-through pattern, Decreased stride length, Decreased dorsiflexion - left, Shuffle, Antalgic, Staggering left, Staggering right General Gait Details: Initially 2 person HHA w/ pt pushing through BUE for support.  Transitioned to 1 person HHA mod A.  Gait velocity interpretation: Below normal speed for age/gender    ADL:    Cognition: Cognition Overall Cognitive Status: No family/caregiver present to determine baseline cognitive  functioning Arousal/Alertness: Awake/alert Orientation Level: Oriented X4 Attention: Selective Selective Attention: Impaired Selective Attention Impairment: Verbal basic, Functional basic Memory: Impaired Memory Impairment: Decreased recall of new information Awareness: Impaired Awareness Impairment: Intellectual impairment Problem Solving: Impaired Problem Solving Impairment: Verbal complex Safety/Judgment: Impaired Cognition Arousal/Alertness: Lethargic Behavior During Therapy: Flat affect Overall Cognitive Status: No family/caregiver present to determine baseline cognitive functioning Area of Impairment: Orientation, Following commands, Safety/judgement, Attention Orientation Level: Disoriented to, Time (Thinks it is Monday but knows it is July) Current Attention Level: Focused Following Commands: Follows one step commands consistently, Follows one step commands with increased time Safety/Judgement: Decreased awareness of safety, Decreased awareness of deficits (However, pt does acknowledge deficits when prompted) General Comments: Vision: Pt demonstrates to read crash cart sign from 20 ft away.   Blood pressure 138/67, pulse 77, temperature 97.7 F (36.5 C), temperature source Oral, resp. rate 18, height 6' (1.829 m), weight 79.6 kg (175 lb 7.8 oz), SpO2 98 %. Physical Exam  Nursing note and vitals reviewed. Constitutional: He is oriented to person, place, and time. He appears well-developed.  Thin elderly male with left periorbital ecchymosis and resolving left forehead ecchymosis. Sleepy but arousable.  HENT:  Head: Normocephalic and atraumatic.  Eyes: Pupils are equal, round, and reactive to light. Left conjunctiva is injected.  Left ptosis.   Neck:  Decreased ROM  Cardiovascular: Normal rate and regular rhythm.   Respiratory: Effort normal and breath sounds normal. No respiratory distress. He has no wheezes.  GI: Soft. Bowel sounds are normal. He exhibits no  distension. There is no tenderness.  Musculoskeletal: He exhibits no edema or tenderness.  Ecchymosis left elbow. Dry dressing left wrist.   Neurological: He is alert and oriented to person, place, and time.  Left facial weakness with moderate dysarthria. Left ptosis but able to scan to the left. Few beats nystagmus right lateral field.  Able to follow basic one and two steps commands without difficulty. Left UE>LE weakness with question component of apraxia. Sensory deficits LLE.   Skin: Skin is warm and dry.    Results for orders placed or performed during the hospital encounter of 04/02/15 (from the past 24 hour(s))  Glucose, capillary     Status: None   Collection Time: 04/04/15  6:16 AM  Result Value Ref Range   Glucose-Capillary 83 65 - 99 mg/dL   Comment 1 Notify RN    Comment 2 Document in Chart    Dg Wrist Complete Left  04/02/2015   CLINICAL DATA:  Recent fall with left wrist pain, initial encounter  EXAM: LEFT WRIST - COMPLETE 3+ VIEW  COMPARISON:  None.  FINDINGS: Degenerative changes of the first [facility] joint are seen. Mild osteopenia is noted. No acute fracture or dislocation is noted.  IMPRESSION: No acute abnormality noted.   Electronically Signed   By: Inez Catalina M.D.   On: 04/02/2015 20:47   Ct Head Wo Contrast  04/02/2015   CLINICAL DATA:  Acute onset slurred speech, left-sided weakness, and left-sided facial droop. Fall onto floor. Head and neck injury. Facial pain and swelling. Initial encounter.  EXAM: CT HEAD WITHOUT CONTRAST  CT MAXILLOFACIAL WITHOUT CONTRAST  CT CERVICAL SPINE WITHOUT CONTRAST  TECHNIQUE: Multidetector CT imaging of the head, cervical spine, and maxillofacial structures were performed using the standard protocol without intravenous contrast. Multiplanar CT image reconstructions of the cervical spine and maxillofacial structures were also generated.  COMPARISON:  Head CT on 12/14/2012  FINDINGS: CT HEAD FINDINGS  There is no evidence of intracranial  hemorrhage, brain edema, or other signs of acute infarction. There is no evidence of intracranial mass lesion or mass effect. No abnormal extraaxial fluid collections are identified.  Mild cerebral atrophy and associated ventriculomegaly are stable. Mild chronic small vessel disease again demonstrated. Mild frontal scalp soft tissue swelling is noted. No evidence of skull fracture or pneumocephalus.  CT MAXILLOFACIAL FINDINGS  Left supraorbital and preseptal soft tissue swelling is seen. Globes and other intraorbital anatomy are normal in appearance.  No evidence of orbital or facial bone fracture. No evidence of orbital emphysema or sinus air-fluid levels.  CT CERVICAL SPINE FINDINGS  No evidence of acute fracture, subluxation, or prevertebral soft tissue swelling.  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  Moderate degenerative disc disease demonstrated at levels of C4-5, C6-7, and C7-T1. Mild to moderate bilateral facet DJD also noted. Advanced atlantoaxial degenerative changes also seen as well as mild cervical kyphoscoliosis.  IMPRESSION: Frontal scalp soft tissue swelling. No evidence of skull fracture or acute intracranial abnormality. Stable mild cerebral atrophy and chronic small vessel disease.  Left supraorbital and preseptal soft tissue swelling. No evidence of orbital or facial bone flexure.  No evidence of acute cervical spine fracture or subluxation. Degenerative spondylosis and congenital fusions, as described above.   Electronically Signed   By: Earle Gell M.D.   On: 04/02/2015 20:23   Ct Cervical Spine Wo Contrast  04/02/2015   CLINICAL DATA:  Acute onset slurred speech, left-sided weakness, and left-sided facial droop. Fall onto floor. Head and neck injury. Facial pain and swelling. Initial encounter.  EXAM: CT HEAD WITHOUT CONTRAST  CT MAXILLOFACIAL WITHOUT CONTRAST  CT CERVICAL SPINE WITHOUT CONTRAST   TECHNIQUE: Multidetector CT imaging of the head, cervical spine, and maxillofacial structures were performed using the standard protocol without intravenous contrast. Multiplanar CT image reconstructions of the cervical spine and maxillofacial structures were also generated.  COMPARISON:  Head CT on 12/14/2012  FINDINGS: CT HEAD FINDINGS  There is no evidence of intracranial hemorrhage, brain edema, or other signs of acute infarction. There is no evidence of intracranial mass lesion or mass effect. No abnormal extraaxial fluid collections are identified.  Mild cerebral atrophy and associated ventriculomegaly are stable. Mild chronic small vessel disease again demonstrated. Mild frontal scalp soft tissue swelling is noted. No evidence of skull fracture or pneumocephalus.  CT MAXILLOFACIAL FINDINGS  Left supraorbital and preseptal soft tissue swelling is seen. Globes and other intraorbital anatomy are normal in appearance.  No evidence of orbital or facial bone fracture. No evidence of orbital emphysema or sinus air-fluid levels.  CT CERVICAL SPINE FINDINGS  No evidence of acute fracture, subluxation, or prevertebral soft tissue swelling.  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  Moderate degenerative disc disease demonstrated at levels of C4-5, C6-7, and C7-T1. Mild to moderate bilateral facet DJD also noted. Advanced atlantoaxial degenerative changes also seen as well as mild cervical kyphoscoliosis.  IMPRESSION: Frontal scalp soft tissue swelling. No evidence of skull fracture or acute intracranial abnormality. Stable mild cerebral atrophy and chronic small vessel disease.  Left supraorbital and preseptal soft tissue swelling. No evidence of orbital or facial bone flexure.  No evidence of acute cervical spine fracture or subluxation. Degenerative spondylosis and congenital fusions, as described above.   Electronically Signed    By: Earle Gell M.D.   On: 04/02/2015 20:23   Mr Jodene Nam Head Wo Contrast  04/02/2015   CLINICAL DATA:  Initial evaluation for left-sided deficit, slurred speech.  EXAM: MRI HEAD WITHOUT CONTRAST  MRA HEAD WITHOUT CONTRAST  TECHNIQUE: Multiplanar, multiecho pulse sequences of the brain and surrounding structures were obtained without intravenous contrast. Angiographic images of the head were obtained using MRA technique without contrast.  COMPARISON:  Prior CT from earlier the same day.  FINDINGS: MRI HEAD FINDINGS  Diffuse prominence of the CSF containing spaces is compatible with generalized cerebral atrophy. No significant white matter disease present for patient age. Small remote bilateral cerebellar infarcts present.  There are patchy multi focal wall predominantly cortical areas of ischemic infarction involving the right MCA territory. These involve the right insular cortex, operculum, and posterior right frontal region, including the right in precentral gyrus. Associated gyral swelling and edema on T2/FLAIR weighted sequences. No significant mass effect. There is associated petechial hemorrhage within the insular cortex (series 9, image 15). No evidence for frank hemorrhagic transformation. No other infarct. Intravascular flow voids maintained.  No mass lesion or midline shift. No mass effect. Ventricular  prominence related to global parenchymal volume loss present without hydrocephalus. No extra-axial fluid collection.  Craniocervical junction within normal limits. Degenerative changes present about the C1-2 articulation. Pituitary gland normal.  No acute abnormality about the orbits. Globes are directed to the right. Sequelae of prior bilateral lens extraction noted.  Small amount of layering opacity within the right sphenoid sinus. Mild mucosal thickening within the maxillary sinuses bilaterally, right greater than left. Minimal mucosal thickening within the ethmoidal air cells as well. The scattered  opacity present within the bilateral mastoid air cells. Inner ear structures within normal limits.  Bone marrow signal intensity normal. Scalp edema present within the left parietal scalp. Edema also noted within the left periorbital region.  MRA HEAD FINDINGS  ANTERIOR CIRCULATION:  Visualized distal cervical segments of the internal carotid arteries are widely patent with antegrade flow. The petrous, cavernous, and supra clinoid segments are widely patent as well. A1 segments, anterior communicating artery, and anterior cerebral arteries well opacified.  Right M1 segment widely patent without stenosis or occlusion. Right MCA bifurcation normal. Short-segment moderate stenosis within a proximal right M2 branch at the base of the right sylvian fissure (series 603, image 6 3). No proximal right M2 branch occlusion. Right MCA branches opacified distally.  There is focal moderate to severe stenosis within the distal left M1 segment just prior to the left MCA bifurcation. This stenosis measures approximately 4-5 mm in length. MCA bifurcation itself is within normal limits. Distal left MCA branches well opacified.  POSTERIOR CIRCULATION:  Vertebral arteries are widely patent to the vertebrobasilar junction. Right vertebral artery is dominant. Posterior inferior cerebellar arteries well opacified. There is a short-segment moderate stenosis within the proximal basilar artery (series 605, image 7). This stenosis measures approximately 3 mm in length. Basilar artery widely patent distally. No basilar tip aneurysm or stenosis. Superior cerebellar arteries patent proximally. Both posterior cerebral arteries arise from the basilar artery. Posterior cerebral arteries are opacified to their distal aspects. A small right posterior communicating artery present.  No aneurysm or vascular malformation.  IMPRESSION: MRI HEAD IMPRESSION:  1. Patchy multi focal cortical right MCA territory infarcts as above. There is associated petechial  hemorrhage within the right insular cortex without evidence of hemorrhagic transformation. 2. Age-related cerebral atrophy with small remote bilateral cerebellar infarcts.  MRA HEAD IMPRESSION:  1. No large vessel or proximal branch occlusion identified. 2. Short segment moderate stenosis within the proximal right M2 branch at the base of the sylvian fissure. 3. Focal moderate to severe stenosis of the distal left M1 segment. 4. Short-segment moderate stenosis within the proximal basilar artery   Electronically Signed   By: Jeannine Boga M.D.   On: 04/02/2015 23:48   Mr Brain Wo Contrast  04/02/2015   CLINICAL DATA:  Initial evaluation for left-sided deficit, slurred speech.  EXAM: MRI HEAD WITHOUT CONTRAST  MRA HEAD WITHOUT CONTRAST  TECHNIQUE: Multiplanar, multiecho pulse sequences of the brain and surrounding structures were obtained without intravenous contrast. Angiographic images of the head were obtained using MRA technique without contrast.  COMPARISON:  Prior CT from earlier the same day.  FINDINGS: MRI HEAD FINDINGS  Diffuse prominence of the CSF containing spaces is compatible with generalized cerebral atrophy. No significant white matter disease present for patient age. Small remote bilateral cerebellar infarcts present.  There are patchy multi focal wall predominantly cortical areas of ischemic infarction involving the right MCA territory. These involve the right insular cortex, operculum, and posterior right frontal region, including the right  in precentral gyrus. Associated gyral swelling and edema on T2/FLAIR weighted sequences. No significant mass effect. There is associated petechial hemorrhage within the insular cortex (series 9, image 15). No evidence for frank hemorrhagic transformation. No other infarct. Intravascular flow voids maintained.  No mass lesion or midline shift. No mass effect. Ventricular prominence related to global parenchymal volume loss present without hydrocephalus.  No extra-axial fluid collection.  Craniocervical junction within normal limits. Degenerative changes present about the C1-2 articulation. Pituitary gland normal.  No acute abnormality about the orbits. Globes are directed to the right. Sequelae of prior bilateral lens extraction noted.  Small amount of layering opacity within the right sphenoid sinus. Mild mucosal thickening within the maxillary sinuses bilaterally, right greater than left. Minimal mucosal thickening within the ethmoidal air cells as well. The scattered opacity present within the bilateral mastoid air cells. Inner ear structures within normal limits.  Bone marrow signal intensity normal. Scalp edema present within the left parietal scalp. Edema also noted within the left periorbital region.  MRA HEAD FINDINGS  ANTERIOR CIRCULATION:  Visualized distal cervical segments of the internal carotid arteries are widely patent with antegrade flow. The petrous, cavernous, and supra clinoid segments are widely patent as well. A1 segments, anterior communicating artery, and anterior cerebral arteries well opacified.  Right M1 segment widely patent without stenosis or occlusion. Right MCA bifurcation normal. Short-segment moderate stenosis within a proximal right M2 branch at the base of the right sylvian fissure (series 603, image 6 3). No proximal right M2 branch occlusion. Right MCA branches opacified distally.  There is focal moderate to severe stenosis within the distal left M1 segment just prior to the left MCA bifurcation. This stenosis measures approximately 4-5 mm in length. MCA bifurcation itself is within normal limits. Distal left MCA branches well opacified.  POSTERIOR CIRCULATION:  Vertebral arteries are widely patent to the vertebrobasilar junction. Right vertebral artery is dominant. Posterior inferior cerebellar arteries well opacified. There is a short-segment moderate stenosis within the proximal basilar artery (series 605, image 7). This  stenosis measures approximately 3 mm in length. Basilar artery widely patent distally. No basilar tip aneurysm or stenosis. Superior cerebellar arteries patent proximally. Both posterior cerebral arteries arise from the basilar artery. Posterior cerebral arteries are opacified to their distal aspects. A small right posterior communicating artery present.  No aneurysm or vascular malformation.  IMPRESSION: MRI HEAD IMPRESSION:  1. Patchy multi focal cortical right MCA territory infarcts as above. There is associated petechial hemorrhage within the right insular cortex without evidence of hemorrhagic transformation. 2. Age-related cerebral atrophy with small remote bilateral cerebellar infarcts.  MRA HEAD IMPRESSION:  1. No large vessel or proximal branch occlusion identified. 2. Short segment moderate stenosis within the proximal right M2 branch at the base of the sylvian fissure. 3. Focal moderate to severe stenosis of the distal left M1 segment. 4. Short-segment moderate stenosis within the proximal basilar artery   Electronically Signed   By: Jeannine Boga M.D.   On: 04/02/2015 23:48   Dg Hip Unilat With Pelvis Min 4 Views Left  04/03/2015   CLINICAL DATA:  Fall with complaints of left hip pain no obvious bruising or abrasions pt is difficult to understand and appears to have weakness in left side. C/o chiefly of neck pain.  EXAM: DG HIP (WITH OR WITHOUT PELVIS) 4+V LEFT  COMPARISON:  None.  FINDINGS: There is no evidence of hip fracture or dislocation. There is no evidence of arthropathy or other focal bone abnormality.  IMPRESSION: Negative.   Electronically Signed   By: Lajean Manes M.D.   On: 04/03/2015 17:16   Ct Maxillofacial Wo Cm  04/02/2015   CLINICAL DATA:  Acute onset slurred speech, left-sided weakness, and left-sided facial droop. Fall onto floor. Head and neck injury. Facial pain and swelling. Initial encounter.  EXAM: CT HEAD WITHOUT CONTRAST  CT MAXILLOFACIAL WITHOUT CONTRAST  CT  CERVICAL SPINE WITHOUT CONTRAST  TECHNIQUE: Multidetector CT imaging of the head, cervical spine, and maxillofacial structures were performed using the standard protocol without intravenous contrast. Multiplanar CT image reconstructions of the cervical spine and maxillofacial structures were also generated.  COMPARISON:  Head CT on 12/14/2012  FINDINGS: CT HEAD FINDINGS  There is no evidence of intracranial hemorrhage, brain edema, or other signs of acute infarction. There is no evidence of intracranial mass lesion or mass effect. No abnormal extraaxial fluid collections are identified.  Mild cerebral atrophy and associated ventriculomegaly are stable. Mild chronic small vessel disease again demonstrated. Mild frontal scalp soft tissue swelling is noted. No evidence of skull fracture or pneumocephalus.  CT MAXILLOFACIAL FINDINGS  Left supraorbital and preseptal soft tissue swelling is seen. Globes and other intraorbital anatomy are normal in appearance.  No evidence of orbital or facial bone fracture. No evidence of orbital emphysema or sinus air-fluid levels.  CT CERVICAL SPINE FINDINGS  No evidence of acute fracture, subluxation, or prevertebral soft tissue swelling.  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  Moderate degenerative disc disease demonstrated at levels of C4-5, C6-7, and C7-T1. Mild to moderate bilateral facet DJD also noted. Advanced atlantoaxial degenerative changes also seen as well as mild cervical kyphoscoliosis.  IMPRESSION: Frontal scalp soft tissue swelling. No evidence of skull fracture or acute intracranial abnormality. Stable mild cerebral atrophy and chronic small vessel disease.  Left supraorbital and preseptal soft tissue swelling. No evidence of orbital or facial bone flexure.  No evidence of acute cervical spine fracture or subluxation. Degenerative spondylosis and congenital fusions, as described  above.   Electronically Signed   By: Earle Gell M.D.   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 Does the need for close, 24 hr/day medical supervision in concert with the patient's rehab needs make it unreasonable for this patient to be served in a less intensive setting? Yes 2. Co-Morbidities requiring supervision/potential complications: CLL, post-stroke sequelae 3. Due to bladder management, bowel management, safety, skin/wound care, disease management, medication administration, pain management and patient education, does the patient require 24 hr/day rehab nursing? Yes 4. Does the patient require coordinated care of a physician, rehab nurse, PT (1-2 hrs/day, 5 days/week), OT (1-2 hrs/day, 5 days/week) and SLP (1-2 hrs/day, 5 days/week) to address physical and functional deficits in the context of the above medical diagnosis(es)? Yes Addressing deficits in the following areas: balance, endurance, locomotion, strength, transferring, bowel/bladder control, bathing, dressing, feeding, grooming, toileting, cognition, speech, swallowing and psychosocial support 5. Can the patient actively participate in an intensive therapy program of at least 3 hrs of therapy per day at least 5 days per week? Yes 6. The potential for patient to make measurable gains while on inpatient rehab is excellent 7. Anticipated functional outcomes upon discharge from inpatient rehab are modified independent and supervision  with PT, modified independent and supervision with OT, modified independent and supervision with SLP. 8. Estimated rehab length  of stay to reach the above functional goals is: 13-16 days 9. Does the patient have adequate social supports and living environment to accommodate these discharge functional goals? Potentially 10. Anticipated D/C setting: Home 11. Anticipated post D/C treatments: [place] therapy 12. Overall Rehab/Functional Prognosis:  excellent  RECOMMENDATIONS: This patient's condition is appropriate for continued rehabilitative care in the following setting: CIR Patient has agreed to participate in recommended program. Yes Note that insurance prior authorization may be required for reimbursement for recommended care.  Comment: Will need supervision at home once discharged from inpatient rehab. Rehab Admissions Coordinator to follow up.  Thanks,  Meredith Staggers, MD, Mellody Drown     04/04/2015

## 2015-04-04 NOTE — Care Management Important Message (Signed)
Important Message  Patient Details  Name: Nicholas Elliott MRN: [id number] Date of Birth: [birth year]   Medicare Important Message Given:  Yes-second notification given    Delorse Lek 04/04/2015, 12:03 PM

## 2015-04-04 NOTE — Evaluation (Signed)
Physical Therapy Evaluation Patient Details Name: Nicholas Elliott MRN: [id number] DOB: Nov 23, 1921 Today's Date: 04/04/2015   History of Present Illness  Pt is a 81 y/o M s/p acute patchy multi focal cortical R MCA infarcts w/ resultant dysarthria and LUE weakness.  Pt found by sister on kitchen floor.  [name] PMH includes TIA, arthritis, glaucoma, CLL (chronic lmyphocytic leukemia).  Clinical Impression  Pt admitted with above diagnosis. Pt currently with functional limitations due to the deficits listed below (see PT Problem List). Pt independent PTA.  Demonstrates ability to ambulate 70 ft w/ 1 person hand held assist this session.  Able to maintain sitting balance w/ RUE support during LUE reaching activity.  Pt highly motivated and would benefit from CIR placement to increase pt's functional independence.   Pt will benefit from skilled PT to increase their independence and safety with mobility to allow discharge to the venue listed below.      Follow Up Recommendations CIR;Supervision/Assistance - 24 hour    Equipment Recommendations  Other (comment) (TBD by next venue of care)    Recommendations for Other Services OT consult;Rehab consult     Precautions / Restrictions Precautions Precautions: Fall Precaution Comments: Pt reports "near blacking out episode" ~2 wks ago while he was lying in bed that dissipated after ~30 seconds.  Pt denies any other falls in the past year. Restrictions Weight Bearing Restrictions: No      Mobility  Bed Mobility Overal bed mobility: Needs Assistance;+ 2 for safety/equipment Bed Mobility: Supine to Sit     Supine to sit: Min assist;HOB elevated;+2 for safety/equipment     General bed mobility comments: Min assist pushing up from L sidelying.  Pt demonstrates ability to push up through L elbow.  Ues of bed rail and increased time.  Cues for technique to push through RUE to scoot hips toward EOB w/ increased time.  Transfers Overall transfer  level: Needs assistance Equipment used: 2 person hand held assist Transfers: Sit to/from Stand Sit to Stand: Min assist;+2 physical assistance         General transfer comment: Min +2 assist for sit>stand for balance, min +2 assist for stand>sit for balance and to assist w/ proper hand positioning on arm rests.  Cues for technique to lean forward during standing.  Ambulation/Gait Ambulation/Gait assistance: Mod assist;+2 safety/equipment Ambulation Distance (Feet): 70 Feet Assistive device: 2 person hand held assist;1 person hand held assist Gait Pattern/deviations: Step-through pattern;Decreased stride length;Decreased dorsiflexion - left;Shuffle;Antalgic;Staggering left;Staggering right   Gait velocity interpretation: Below normal speed for age/gender General Gait Details: Initially 2 person HHA w/ pt pushing through BUE for support.  Transitioned to 1 person HHA mod A.   Stairs            Wheelchair Mobility    Modified Rankin (Stroke Patients Only) Modified Rankin (Stroke Patients Only) Pre-Morbid Rankin Score: No significant disability Modified Rankin: Moderately severe disability     Balance Overall balance assessment: Needs assistance;History of Falls Sitting-balance support: Single extremity supported;Feet supported Sitting balance-Leahy Scale: Poor Sitting balance - Comments: relies on support of RUE to maintain balance during reaching activities w/ LUE sitting EOB  Postural control: Left lateral lean Standing balance support: Single extremity supported;During functional activity Standing balance-Leahy Scale: Poor Standing balance comment: Relies on 1 person HHA to maintain balance                             Pertinent Vitals/Pain Pain  Assessment: Faces Faces Pain Scale: Hurts little more Pain Location: L lower abdomen, ecchymosis noted (likely from fall) and RN notified Pain Descriptors / Indicators: Tightness Pain Intervention(s): Limited  activity within patient's tolerance;Monitored during session;Repositioned    Home Living Family/patient expects to be discharged to:: Inpatient rehab Living Arrangements: Alone               Additional Comments: Pt reports he lives separately from his wife.  Has ramp to enter 1 story home    Prior Function Level of Independence: Independent         Comments: Pt reports he was Ind PTA w/ ADLs and mobility.  Has friend who is an Therapist, sports who comes to visit from time to time but she does not assist him.  Has WC (his wife's) and RW.       Hand Dominance   Dominant Hand: Right    Extremity/Trunk Assessment   Upper Extremity Assessment: LUE deficits/detail;Defer to OT evaluation       LUE Deficits / Details: 1/5 power grasp, 2/5 L elbow flexion   Lower Extremity Assessment: Generalized weakness;LLE deficits/detail;Difficult to assess due to impaired cognition   LLE Deficits / Details: 3/5 general LLE strength (DF, PF, hip abd/add, knee F/E)     Communication   Communication: Other (comment) (dysarthria)  Cognition Arousal/Alertness: Lethargic Behavior During Therapy: Flat affect Overall Cognitive Status: No family/caregiver present to determine baseline cognitive functioning Area of Impairment: Orientation;Following commands;Safety/judgement;Attention Orientation Level: Disoriented to;Time (Thinks it is Monday but knows it is July) Current Attention Level: Focused   Following Commands: Follows one step commands consistently;Follows one step commands with increased time Safety/Judgement: Decreased awareness of safety;Decreased awareness of deficits (However, pt does acknowledge deficits when prompted)     General Comments: Vision: Pt demonstrates to read crash cart sign from 20 ft away.    General Comments General comments (skin integrity, edema, etc.): CN II testing demonstrated saccadic eye movements looking downward to the left. RN confirmed that pt's X-ray of L hip was  negative, no WB restrictions.     Exercises        Assessment/Plan    PT Assessment Patient needs continued PT services  PT Diagnosis Difficulty walking;Abnormality of gait;Generalized weakness;Acute pain;Hemiplegia non-dominant side   PT Problem List Decreased strength;Decreased activity tolerance;Decreased balance;Decreased mobility;Decreased coordination;Decreased cognition;Decreased knowledge of use of DME;Decreased safety awareness;Decreased knowledge of precautions;Decreased skin integrity;Pain  PT Treatment Interventions DME instruction;Gait training;Stair training;Functional mobility training;Therapeutic activities;Therapeutic exercise;Balance training;Neuromuscular re-education;Cognitive remediation;Patient/family education;Modalities   PT Goals (Current goals can be found in the Care Plan section) Acute Rehab PT Goals Patient Stated Goal: to get better PT Goal Formulation: With patient Time For Goal Achievement: 04/18/15 Potential to Achieve Goals: Good    Frequency Min 4X/week   Barriers to discharge Decreased caregiver support No assist available at home    Co-evaluation               End of Session Equipment Utilized During Treatment: Gait belt Activity Tolerance: Patient tolerated treatment well Patient left: in chair;with call bell/phone within reach Nurse Communication: Mobility status;Precautions         Time: 8315-1761 PT Time Calculation (min) (ACUTE ONLY): 31 min   Charges:   PT Evaluation $Initial PT Evaluation Tier I: 1 Procedure PT Treatments $Gait Training: 8-22 mins   PT G CodesJoslyn Hy PT, DPT [phone] Pager: [phone] 04/04/2015, 11:25 AM

## 2015-04-04 NOTE — Evaluation (Signed)
Occupational Therapy Evaluation Patient Details Name: Nicholas Elliott MRN: [id number] DOB: [birth year] Today's Date: 04/04/2015    History of Present Illness Pt is a 81 y/o M s/p acute patchy multi focal cortical R MCA infarcts w/ resultant dysarthria and LUE weakness.  Pt found by sister on kitchen floor.  [name] PMH includes TIA, arthritis, glaucoma, CLL (chronic lmyphocytic leukemia).   Clinical Impression   Pt independent with ADLs PTA. Pt completed bed mobility with min assist, and transferred to recliner with mod assist and cuing to lean forward. Pt reports he does not live with wife, but she assist in his care and is available to assist with ADLs when needed. Pt would benefit from CIR to increase safety and independence with ADLs.    Follow Up Recommendations  CIR    Equipment Recommendations  Other (comment) (TBD next venue)    Recommendations for Other Services       Precautions / Restrictions Precautions Precautions: Fall Precaution Comments: Pt reports "near blacking out episode" ~2 wks ago while he was lying in bed that dissipated after ~30 seconds.  Pt denies any other falls in the past year. Restrictions Weight Bearing Restrictions: No      Mobility Bed Mobility Overal bed mobility: Needs Assistance Bed Mobility: Rolling;Sidelying to Sit Rolling: Min assist Sidelying to sit: Mod assist Supine to sit: Min assist;HOB elevated;+2 for safety/equipment     General bed mobility comments: Min assist pushing up from L sidelying.  Pt demonstrates ability to push up through L elbow.  Ues of bed rail and increased time.  Cues for technique to push through RUE to scoot hips toward EOB w/ increased time.  Transfers Overall transfer level: Needs assistance Equipment used: 1 person hand held assist Transfers: Sit to/from Stand Sit to Stand: Mod assist         General transfer comment: Cues for technique, hand placement and to lean forward while standing    Balance  Overall balance assessment: Needs assistance;History of Falls Sitting-balance support: Single extremity supported;Feet supported Sitting balance-Leahy Scale: Fair Sitting balance - Comments: relies on support of RUE to maintain balance during reaching activities w/ LUE sitting EOB  Postural control: Posterior lean;Left lateral lean Standing balance support: Bilateral upper extremity supported Standing balance-Leahy Scale: Poor Standing balance comment: Relies on 1 person HHA to maintain balance; cues to lean forward                            ADL Overall ADL's : Needs assistance/impaired Eating/Feeding: Modified independent;Sitting Eating/Feeding Details (indicate cue type and reason): Pt attempting to eat with eyes closed Grooming: Moderate assistance;Sitting   Upper Body Bathing: Moderate assistance;Sitting   Lower Body Bathing: Maximal assistance;Sit to/from stand   Upper Body Dressing : Moderate assistance;Sitting   Lower Body Dressing: Maximal assistance;Sit to/from stand   Toilet Transfer: Moderate assistance;Stand-pivot;BSC   Toileting- Clothing Manipulation and Hygiene: Moderate assistance;Sit to/from stand       Functional mobility during ADLs: Moderate assistance       Vision Vision Assessment?: Yes Eye Alignment: Within Functional Limits Ocular Range of Motion: Within Functional Limits Alignment/Gaze Preference: Within Defined Limits Tracking/Visual Pursuits: Able to track stimulus in all quads without difficulty Saccades: Within functional limits Visual Fields: No apparent deficits Additional Comments: Bruising around L eye   Perception     Praxis      Pertinent Vitals/Pain Pain Assessment: Faces Faces Pain Scale: Hurts even more Pain Location: headache  Pain Descriptors / Indicators: Aching Pain Intervention(s): Monitored during session;Repositioned;Patient requesting pain meds-RN notified     Hand Dominance Right   Extremity/Trunk  Assessment Upper Extremity Assessment Upper Extremity Assessment: LUE deficits/detail LUE Deficits / Details: 3-/5 shoulder flexion; 2-/5 wrist extension; decreased grip and fine motor  LUE Sensation: decreased light touch LUE Coordination: decreased fine motor;decreased gross motor   Lower Extremity Assessment Lower Extremity Assessment: Defer to PT evaluation;Generalized weakness LLE Deficits / Details: 3/5 general LLE strength (DF, PF, hip abd/add, knee F/E)       Communication Communication Communication: Expressive difficulties;Other (comment) (dysarthria)   Cognition Arousal/Alertness: Awake/alert Behavior During Therapy: Flat affect Overall Cognitive Status: No family/caregiver present to determine baseline cognitive functioning Area of Impairment: Orientation;Safety/judgement;Attention Orientation Level: Disoriented to;Time (Thinks it's Monday, but knows its July 2016) Current Attention Level: Focused   Following Commands: Follows one step commands consistently;Follows one step commands with increased time Safety/Judgement: Decreased awareness of safety;Decreased awareness of deficits        General Comments       Exercises       Shoulder Instructions      Home Living Family/patient expects to be discharged to:: Inpatient rehab Living Arrangements: Alone Available Help at Discharge: Family;Available PRN/intermittently (ex wife) Type of Home: House Home Access: Ramped entrance     Home Layout: One level     Bathroom Shower/Tub: Walk-in shower         Home Equipment: Grab bars - tub/shower;Hand held Tourist information centre manager - 4 wheels;Cane - single point;Shower seat   Additional Comments: Pt reports he lives separately from his wife; but she assists with his care  Lives With: Alone    Prior Functioning/Environment Level of Independence: Independent        Comments: still drives    OT Diagnosis: Generalized weakness;Cognitive deficits;Disturbance of  vision;Hemiplegia non-dominant side   OT Problem List: Decreased strength;Decreased range of motion;Decreased activity tolerance;Impaired balance (sitting and/or standing);Decreased cognition;Decreased safety awareness;Decreased knowledge of use of DME or AE;Impaired sensation;Impaired UE functional use   OT Treatment/Interventions: Self-care/ADL training;Therapeutic exercise;DME and/or AE instruction;Therapeutic activities;Patient/family education;Balance training    OT Goals(Current goals can be found in the care plan section) Acute Rehab OT Goals Patient Stated Goal: none given OT Goal Formulation: With patient Time For Goal Achievement: 04/18/15 ADL Goals Pt Will Perform Grooming: with min assist;sitting Pt Will Perform Upper Body Dressing: with min assist;sitting Pt Will Transfer to Toilet: with min assist;stand pivot transfer;bedside commode Pt Will Perform Toileting - Clothing Manipulation and hygiene: with min assist;sit to/from stand  OT Frequency: Min 3X/week   Barriers to D/C:            Co-evaluation              End of Session Equipment Utilized During Treatment: Gait belt Nurse Communication: Mobility status;Patient requests pain meds;Other (comment) (condom cath not attached)  Activity Tolerance: Patient tolerated treatment well Patient left: in chair;with call bell/phone within reach;with family/visitor present   Time: 1333-1400 OT Time Calculation (min): 27 min Charges:  OT General Charges $OT Visit: 1 Procedure OT Evaluation $Initial OT Evaluation Tier I: 1 Procedure OT Treatments $Self Care/Home Management : 8-22 mins G-Codes:    Forest Gleason 04/04/2015, 2:36 PM

## 2015-04-04 NOTE — Progress Notes (Signed)
STROKE TEAM PROGRESS NOTE   HISTORY Nicholas Elliott is a 81 y.o. male with a past medical history significant for hypercholesterolemia, TIA's, migraine,CLL, arthritis, and glaucoma, brought to the ED via EMS for evaluation of dysarthria, left hemiparesis, and left facial weakness. Patient is currently drowsy and able to answer questions rather appropriately, however he doesn't engage in conversation, therefore all clinical information was obtained from patient's [name]" Per EMS, patient is from home, found by sister in the kitchen on the floor after trying to reach him by phone with no success. Sister last saw patient normal this morning at 9am. He has bruise to left eye, and left wrist, left sided deficit and slurred speech. Wearing c-collar on arrival". " Pt was last seen normal by daughter at 0900 am this morning when daughter returned at 2 pt was found on the kitchen floor laying on left side. Pt is noted to have slurred speech, left sided weakness and left sided facial droop. Pt is alert and o x4".  CT brain performed tonight was personally reviewed and showed no acute abnormality. Serologies significant for wbc 33.7, platelets 126, UDS negative. ETOH level pending. On aspirin and plavix.  Date last known well: 04/02/15 Time last known well: 9 am tPA Given: no, out of the window   SUBJECTIVE (INTERVAL HISTORY) No family members present. The patient remains somewhat lethargic. He states he is feeling better.   OBJECTIVE Temp:  [97.7 F (36.5 C)-98.5 F (36.9 C)] 97.7 F (36.5 C) (07/15 1100) Pulse Rate:  [70-85] 77 (07/15 1100) Cardiac Rhythm:  [-] Normal sinus rhythm (07/15 0759) Resp:  [16-21] 18 (07/15 1100) BP: (107-138)/(46-67) 138/67 mmHg (07/15 1100) SpO2:  [92 %-99 %] 98 % (07/15 1100)   Recent Labs Lab 04/02/15 1945 04/04/15 0616  GLUCAP 190* 83    Recent Labs Lab 04/02/15 1952 04/02/15 1958  NA 140 143  K 4.2 4.0  CL 107 106  CO2 24  --    GLUCOSE 209* 203*  BUN 21* 25*  CREATININE 0.97 0.80  CALCIUM 8.9  --     Recent Labs Lab 04/02/15 1952  AST 44*  ALT 22  ALKPHOS 48  BILITOT 0.6  PROT 6.2*  ALBUMIN 3.9    Recent Labs Lab 04/02/15 1952 04/02/15 1958  WBC 33.7*  --   NEUTROABS 10.8*  --   HGB 14.4 16.3  HCT 43.9 48.0  MCV 98.0  --   PLT 126*  --    No results for input(s): CKTOTAL, CKMB, CKMBINDEX, TROPONINI in the last 168 hours.  Recent Labs  04/02/15 1952  LABPROT 14.1  INR 1.07    Recent Labs  04/02/15 2020  COLORURINE YELLOW  LABSPEC 1.023  PHURINE 5.5  GLUCOSEU 250*  HGBUR SMALL*  BILIRUBINUR NEGATIVE  KETONESUR 15*  PROTEINUR NEGATIVE  UROBILINOGEN 0.2  NITRITE NEGATIVE  LEUKOCYTESUR NEGATIVE       Component Value Date/Time   CHOL 134 04/03/2015 0012   TRIG 31 04/03/2015 0012   HDL 48 04/03/2015 0012   CHOLHDL 2.8 04/03/2015 0012   VLDL 6 04/03/2015 0012   LDLCALC 80 04/03/2015 0012   Lab Results  Component Value Date   HGBA1C 6.1* 04/03/2015      Component Value Date/Time   LABOPIA NONE DETECTED 04/02/2015 2020   COCAINSCRNUR NONE DETECTED 04/02/2015 2020   LABBENZ NONE DETECTED 04/02/2015 2020   AMPHETMU NONE DETECTED 04/02/2015 2020   THCU NONE DETECTED 04/02/2015 2020   LABBARB NONE DETECTED  04/02/2015 2020     Recent Labs Lab 04/03/15 0012  ETH <5   Imaging    Dg Wrist Complete Left 04/02/2015    No acute abnormality noted.   Ct Head Wo Contrast 04/02/2015    Frontal scalp soft tissue swelling. No evidence of skull fracture or acute intracranial abnormality. Stable mild cerebral atrophy and chronic small vessel disease.  Left supraorbital and preseptal soft tissue swelling. No evidence of orbital or facial bone flexure.  No evidence of acute cervical spine fracture or subluxation. Degenerative spondylosis and congenital fusions, as described above.      Ct Cervical Spine Wo Contrast 04/02/2015    Frontal scalp soft tissue swelling. No evidence  of skull fracture or acute intracranial abnormality. Stable mild cerebral atrophy and chronic small vessel disease.  Left supraorbital and preseptal soft tissue swelling. No evidence of orbital or facial bone flexure.  No evidence of acute cervical spine fracture or subluxation. Degenerative spondylosis and congenital fusions, as described above.      Mr Jodene Nam Head Wo Contrast 04/02/2015     MRI HEAD IMPRESSION:   1. Patchy multi focal cortical right MCA territory infarcts as above. There is associated petechial hemorrhage within the right insular cortex without evidence of hemorrhagic transformation.  2. Age-related cerebral atrophy with small remote bilateral cerebellar infarcts.    MRA HEAD IMPRESSION:   1. No large vessel or proximal branch occlusion identified.  2. Short segment moderate stenosis within the proximal right M2 branch at the base of the sylvian fissure.  3. Focal moderate to severe stenosis of the distal left M1 segment.  4. Short-segment moderate stenosis within the proximal basilar artery      Ct Maxillofacial Wo Cm 04/02/2015    Frontal scalp soft tissue swelling. No evidence of skull fracture or acute intracranial abnormality. Stable mild cerebral atrophy and chronic small vessel disease.  Left supraorbital and preseptal soft tissue swelling. No evidence of orbital or facial bone flexure.  No evidence of acute cervical spine fracture or subluxation. Degenerative spondylosis and congenital fusions, as described above.       PHYSICAL EXAM Frail elderly cachectic Caucasian male not in distress. Left frontal and periorbitall ecchymosis from fall and contusion. Conjunctival congestion however the left eye and pain on passive opening of left eye which appears closed and swollen. . Afebrile. Head is nontraumatic. Neck is supple without bruit.    Cardiac exam no murmur or gallop. Lungs are clear to auscultation. Distal pulses are well felt.  Neurological Exam :  Drowsy but can  be easily awakened and follows commands. Oriented 2. Mild dysarthria but can be understood with some difficulty. No aphasia or apraxia. Follows one and a few two-step commands Left eye is swollen and closed and painful on passive opening with conjunctival congestion. Right gaze preference and able to look to the left Harvey up to midline. Blinks to threat on the right but not the left. Left lower facial weakness. Tongue midline. Left upper extremity is plegic 0/5 strength with hypotonia. Left lower extremity 4/5 strength with only mild weakness of hip flexors and ankle dorsiflexors. Sensation appears preserved bilaterally. Deep tendon reflexes are depressed on the left and normal on the right. Left plantar is equivocal right is downgoing. Gait was not tested.   ASSESSMENT/PLAN Mr. MAKIH STEFANKO is a 81 y.o. male with history of hyperlipidemia, previous TIA, migraine headaches, and chronic lymphocytic leukemia presenting with dysarthria, left hemiparesis, and left facial weakness after being found on  the floor in the kitchen. He did not receive IV t-PA due to late presentation.  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  Resultant dysarthria and left upper extremity weakness.  MRI  Patchy multi focal cortical right MCA territory infarcts  MRA   Focal moderate to severe stenosis of the distal left M1 segment. moderate stenosis within the proximal  right M2 branch  Carotid Doppler  Bilateral: 1-39% ICA stenosis. Vertebral artery flow is antegrade.   2D Echo pending  LDL 80  HgbA1c 6.1  Subcutaneous heparin for VTE prophylaxis DIET DYS 3 Room service appropriate?: Yes; Fluid consistency:: Honey Thick  aspirin 81 mg orally every day and clopidogrel 75 mg orally every day prior to admission, now on aspirin 81 mg orally every day and clopidogrel 75 mg orally every day  Ongoing aggressive stroke risk factor management  Therapy recommendations:  CIR  recommended - rehabilitation M.D. consult pending.  Disposition:  Pending  Hypertension  Home meds: No antihypertensives medications prior to admission.  Stable   Hyperlipidemia  Home meds:  Crestor 20 mg daily resumed in hospital  LDL 80, goal < 70  Continue statin at discharge    Other Stroke Risk Factors  Advanced age  Hx stroke/TIA  Family hx stroke (father)   Other Active Problems  Multiple contusions  Mild dehydration  Other Pertinent History  Not a candidate for anticoagulation secondary to falls  DNR  Hospital day # 2  Mikey Bussing PA-C [facility] Pager [phone] 04/04/2015, 2:36 PM     I have personally examined this patient, reviewed notes, independently viewed imaging studies, participated in medical decision making and plan of care. I have made any additions or clarifications directly to the above note. Agree with note above. He presented with a fall. leadng to left frontal and periorbital contusion and left hemiparesis secondary to right MCA branch infarct likely of embolic etiology.  Continue aspirin.Marland Kitchen He may not be a good long-term candidate for anticoagulation given advanced age, fall risk and hence we will not pursue TEE and loop recorder Antony Contras, MD    To contact Stroke Continuity provider, please refer to [email or web address] After hours, contact General Neurology

## 2015-04-04 NOTE — Progress Notes (Signed)
Echocardiogram 2D Echocardiogram has been performed.  Nicholas Elliott 04/04/2015, 3:47 PM

## 2015-04-05 LAB — GLUCOSE, CAPILLARY: Glucose-Capillary: 171 mg/dL — ABNORMAL HIGH (ref 65–99)

## 2015-04-05 NOTE — Progress Notes (Addendum)
Occupational Therapy Treatment Patient Details Name: Nicholas Elliott MRN: [id number] DOB: 03/21/1922 Today's Date: 04/05/2015    History of present illness Pt is a 81 y.o. M s/p acute patchy multi focal cortical R MCA infarcts w/ resultant dysarthria and LUE weakness.  Pt found by sister on kitchen floor.  [name] PMH includes TIA, arthritis, glaucoma, CLL (chronic lmyphocytic leukemia).   OT comments  Pt progressing. Education provided in session. Continue to recommend CIR for rehab.   Follow Up Recommendations  CIR    Equipment Recommendations  Other (comment) (defer to next venue)    Recommendations for Other Services      Precautions / Restrictions Precautions Precautions: Fall Restrictions Weight Bearing Restrictions: No       Mobility Bed Mobility Overal bed mobility: Needs Assistance Bed Mobility: Supine to Sit     Supine to sit: Min assist     General bed mobility comments: assist with trunk.   Transfers Overall transfer level: Needs assistance   Transfers: Sit to/from Stand;Stand Pivot Transfers Sit to Stand: Mod assist Stand pivot transfers: Min assist       General transfer comment: cues for technique. Pt holding onto therapist for stand pivot transfer.     Balance   Assist for balance for stand pivot and sit to stand transfers. No LOB sitting EOB during functional tasks.                                  ADL Overall ADL's : Needs assistance/impaired     Grooming: Wash/dry face;Applying deodorant;Sitting;Minimal assistance Grooming Details (indicate cue type and reason): assist to open deodorant and to apply more deodorant under right arm. Upper Body Bathing: Set up;Supervision/ safety;Sitting Upper Body Bathing Details (indicate cue type and reason): washed armpits     Upper Body Dressing : Minimal assistance;Sitting (gown)       Toilet Transfer: Minimal assistance;Moderate assistance;Stand-pivot (Mod assist for sit to stand from  bed; Min assist for pivot)           Functional mobility during ADLs: Minimal assistance (stand pivot transfer) General ADL Comments: Encouraged pt to use left hand in session and had him supporting grooming items. Supported left arm on pillow at end of session. educated on UB dressing technique.       Vision                     Perception     Praxis      Cognition  Awake/Alert Behavior During Therapy: WFL for tasks assessed/performed Overall Cognitive Status: Difficult to assess due to impaired communication; cues for pt to stay on task                       Extremity/Trunk Assessment               Exercises     Shoulder Instructions       General Comments      Pertinent Vitals/ Pain       Pain Assessment: Faces Faces Pain Scale: Hurts little more Pain Location: around left eye  Pain Descriptors / Indicators: Sore Pain Intervention(s): Monitored during session  Home Living  Prior Functioning/Environment              Frequency Min 3X/week     Progress Toward Goals  OT Goals(current goals can now be found in the care plan section)  Progress towards OT goals: Progressing toward goals (updated some goals due to progress)  Acute Rehab OT Goals Patient Stated Goal: not stated OT Goal Formulation: With patient Time For Goal Achievement: 04/18/15 ADL Goals Pt Will Perform Grooming: sitting;with set-up;with supervision Pt Will Perform Upper Body Dressing: sitting;with min assist Pt Will Transfer to Toilet: stand pivot transfer;bedside commode;with min guard assist Pt Will Perform Toileting - Clothing Manipulation and hygiene: with min assist;sit to/from stand  Plan Discharge plan remains appropriate    Co-evaluation                 End of Session Equipment Utilized During Treatment: Gait belt   Activity Tolerance Patient tolerated treatment well   Patient Left in  chair;with call bell/phone within reach;with family/visitor present   Nurse Communication Mobility status;Other (comment) (pt in chair with no alarm; visitor in room)        Time: 7195-9747 OT Time Calculation (min): 16 min  Charges: OT General Charges $OT Visit: 1 Procedure OT Treatments $Self Care/Home Management : 8-22 mins  Benito Mccreedy OTR/L [phone] 04/05/2015, 3:46 PM

## 2015-04-05 NOTE — Progress Notes (Signed)
Speech Language Pathology Treatment: Dysphagia  Patient Details Name: Nicholas Elliott MRN: [id number] DOB: 1922/02/01 Today's Date: 04/05/2015 Time: 5681-2751 SLP Time Calculation (min) (ACUTE ONLY): 15 min  Assessment / Plan / Recommendation Clinical Impression  Pt with wet vocal quality after breakfast tray with probable pharyngeal residue d/t neurogenic dysphagia; multiple swallows and throat clearing with subsequent swallows improved vocal quality prior to po intake of honey-thickened liquids and Dysphagia 3 diet; skilled observation with minimal verbal cues required for head turn left which eliminated wet vocal quality with small sips of honey-thickened liquids; straw sips increased wet vocal quality, so this was eliminated and swallow precautions updated on yellow sheet and nursing informed; pt would benefit from MBS to determine safest diet/liquid consistency prior to d/c to SNF, but Radiology did not have availability this date; ST to f/u on 04/06/15 for MBS prior to d/c. Pt followed precautions with minimal to no reminders to utilize during liquid intake.   HPI Other Pertinent Information: 81 y.o. 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  Pt very active, independent PTA.  Lives alone, plays golf twice a week, has support network of friends.  Hx also includes cleft palate with repair at [facility] in 1951 - speech at baseline is marked by hypernasal resonance, impacting speech clarity.     Pertinent Vitals Pain Assessment: No/denies pain  SLP Plan  Continue with current plan of care    Recommendations Diet recommendations: Dysphagia 3 (mechanical soft);Honey-thick liquid Liquids provided via: Cup;No straw Medication Administration: Whole meds with puree Compensations: Slow rate;Small sips/bites;Effortful swallow;Other (Comment) (Head turn left) Postural Changes and/or  Swallow Maneuvers: Head turn left during swallow              Oral Care Recommendations: Oral care BID Follow up Recommendations: Skilled Nursing facility;Other (comment) (pt request) Plan: Continue with current plan of care         Nicholas Elliott,PAT, M.S., CCC-SLP 04/05/2015, 11:48 AM

## 2015-04-05 NOTE — Progress Notes (Signed)
STROKE TEAM PROGRESS NOTE   HISTORY Nicholas Elliott is a 81 y.o. male with a past medical history significant for hypercholesterolemia, TIA's, migraine,CLL, arthritis, and glaucoma, brought to the ED via EMS for evaluation of dysarthria, left hemiparesis, and left facial weakness. Patient is currently drowsy and able to answer questions rather appropriately, however he doesn't engage in conversation, therefore all clinical information was obtained from patient's [place]" Per EMS, patient is from home, found by sister in the kitchen on the floor after trying to reach him by phone with no success. Sister last saw patient normal this morning at 9am. He has bruise to left eye, and left wrist, left sided deficit and slurred speech. Wearing c-collar on arrival". " Pt was last seen normal by daughter at 0900 am this morning when daughter returned at 62 pt was found on the kitchen floor laying on left side. Pt is noted to have slurred speech, left sided weakness and left sided facial droop. Pt is alert and o x4".  CT brain performed tonight was personally reviewed and showed no acute abnormality. Serologies significant for wbc 33.7, platelets 126, UDS negative. ETOH level pending. On aspirin and plavix.  Date last known well: 04/02/15 Time last known well: 9 am tPA Given: no, out of the window   SUBJECTIVE (INTERVAL HISTORY) No family members present. The patient states that he feels much better today he appears more alert and is able to follow all commands. He has made significant improvement. Anticoagulation may need to be reconsidered if embolic source can be documented.   OBJECTIVE Temp:  [97.6 F (36.4 C)-97.8 F (36.6 C)] 97.8 F (36.6 C) (07/16 0555) Pulse Rate:  [66-83] 75 (07/16 0555) Cardiac Rhythm:  [-] Normal sinus rhythm (07/16 0800) Resp:  [15-18] 18 (07/16 0555) BP: (118-122)/(48-72) 120/72 mmHg (07/16 0555) SpO2:  [94 %-98 %] 98 % (07/16 0555)   Recent Labs Lab  04/02/15 1945 04/04/15 0616 04/05/15 0559  GLUCAP 190* 83 171*    Recent Labs Lab 04/02/15 1952 04/02/15 1958  NA 140 143  K 4.2 4.0  CL 107 106  CO2 24  --   GLUCOSE 209* 203*  BUN 21* 25*  CREATININE 0.97 0.80  CALCIUM 8.9  --     Recent Labs Lab 04/02/15 1952  AST 44*  ALT 22  ALKPHOS 48  BILITOT 0.6  PROT 6.2*  ALBUMIN 3.9    Recent Labs Lab 04/02/15 1952 04/02/15 1958  WBC 33.7*  --   NEUTROABS 10.8*  --   HGB 14.4 16.3  HCT 43.9 48.0  MCV 98.0  --   PLT 126*  --    No results for input(s): CKTOTAL, CKMB, CKMBINDEX, TROPONINI in the last 168 hours.  Recent Labs  04/02/15 1952  LABPROT 14.1  INR 1.07    Recent Labs  04/02/15 2020  COLORURINE YELLOW  LABSPEC 1.023  PHURINE 5.5  GLUCOSEU 250*  HGBUR SMALL*  BILIRUBINUR NEGATIVE  KETONESUR 15*  PROTEINUR NEGATIVE  UROBILINOGEN 0.2  NITRITE NEGATIVE  LEUKOCYTESUR NEGATIVE       Component Value Date/Time   CHOL 134 04/03/2015 0012   TRIG 31 04/03/2015 0012   HDL 48 04/03/2015 0012   CHOLHDL 2.8 04/03/2015 0012   VLDL 6 04/03/2015 0012   LDLCALC 80 04/03/2015 0012   Lab Results  Component Value Date   HGBA1C 6.1* 04/03/2015      Component Value Date/Time   LABOPIA NONE DETECTED 04/02/2015 2020   COCAINSCRNUR NONE  DETECTED 04/02/2015 2020   LABBENZ NONE DETECTED 04/02/2015 2020   AMPHETMU NONE DETECTED 04/02/2015 2020   THCU NONE DETECTED 04/02/2015 2020   LABBARB NONE DETECTED 04/02/2015 2020     Recent Labs Lab 04/03/15 0012  ETH <5   Imaging    Dg Wrist Complete Left 04/02/2015    No acute abnormality noted.   Ct Head Wo Contrast 04/02/2015    Frontal scalp soft tissue swelling. No evidence of skull fracture or acute intracranial abnormality. Stable mild cerebral atrophy and chronic small vessel disease.  Left supraorbital and preseptal soft tissue swelling. No evidence of orbital or facial bone flexure.  No evidence of acute cervical spine fracture or  subluxation. Degenerative spondylosis and congenital fusions, as described above.      Ct Cervical Spine Wo Contrast 04/02/2015    Frontal scalp soft tissue swelling. No evidence of skull fracture or acute intracranial abnormality. Stable mild cerebral atrophy and chronic small vessel disease.  Left supraorbital and preseptal soft tissue swelling. No evidence of orbital or facial bone flexure.  No evidence of acute cervical spine fracture or subluxation. Degenerative spondylosis and congenital fusions, as described above.      Mr Nicholas Elliott Head Wo Contrast 04/02/2015     MRI HEAD IMPRESSION:   1. Patchy multi focal cortical right MCA territory infarcts as above. There is associated petechial hemorrhage within the right insular cortex without evidence of hemorrhagic transformation.  2. Age-related cerebral atrophy with small remote bilateral cerebellar infarcts.    MRA HEAD IMPRESSION:   1. No large vessel or proximal branch occlusion identified.  2. Short segment moderate stenosis within the proximal right M2 branch at the base of the sylvian fissure.  3. Focal moderate to severe stenosis of the distal left M1 segment.  4. Short-segment moderate stenosis within the proximal basilar artery      Ct Maxillofacial Wo Cm 04/02/2015    Frontal scalp soft tissue swelling. No evidence of skull fracture or acute intracranial abnormality. Stable mild cerebral atrophy and chronic small vessel disease.  Left supraorbital and preseptal soft tissue swelling. No evidence of orbital or facial bone flexure.  No evidence of acute cervical spine fracture or subluxation. Degenerative spondylosis and congenital fusions, as described above.       PHYSICAL EXAM Frail elderly cachectic Caucasian male not in distress. Left frontal and periorbitall ecchymosis from fall and contusion. Conjunctival congestion however the left eye and pain on passive opening of left eye which appears closed and swollen. . Afebrile. Head is  nontraumatic. Neck is supple without bruit.    Cardiac exam no murmur or gallop. Lungs are clear to auscultation. Distal pulses are well felt.  Neurological Exam :  Drowsy but can be easily awakened and follows commands. Oriented 2. Mild dysarthria but can be understood with some difficulty. No aphasia or apraxia. Follows one and a few two-step commands Left eye is swollen and closed and painful on passive opening with conjunctival congestion. Right gaze preference and able to look to the left Harvey up to midline. Blinks to threat on the right but not the left. Left lower facial weakness. Tongue midline. Left upper extremity is plegic 0/5 strength with hypotonia. Left lower extremity 4/5 strength with only mild weakness of hip flexors and ankle dorsiflexors. Sensation appears preserved bilaterally. Deep tendon reflexes are depressed on the left and normal on the right. Left plantar is equivocal right is downgoing. Gait was not tested.   ASSESSMENT/PLAN Mr. Nicholas Elliott is  a 81 y.o. male with history of hyperlipidemia, previous TIA, migraine headaches, and chronic lymphocytic leukemia presenting with dysarthria, left hemiparesis, and left facial weakness after being found on the floor in the kitchen. He did not receive IV t-PA due to late presentation.  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  Resultant dysarthria and left upper extremity weakness.  MRI  Patchy multi focal cortical right MCA territory infarcts  MRA   Focal moderate to severe stenosis of the distal left M1 segment. moderate stenosis within the proximal  right M2 branch  Carotid Doppler  Bilateral: 1-39% ICA stenosis. Vertebral artery flow is antegrade.   2D Echo - EF 60-65%. No cardiac source of emboli identified although the patient has severe LVH that a heavily calcified aortic valve and moderate left atrial enlargement.  LDL 80  HgbA1c 6.1  Subcutaneous heparin for VTE  prophylaxis DIET DYS 3 Room service appropriate?: Yes; Fluid consistency:: Honey Thick  aspirin 81 mg orally every day and clopidogrel 75 mg orally every day prior to admission, now on aspirin 81 mg orally every day and clopidogrel 75 mg orally every day  Ongoing aggressive stroke risk factor management  Therapy recommendations:  CIR recommended - rehabilitation M.D. Consult - obtained. Excellent candidate. Awaiting insurance approval.  Disposition:  Pending  Hypertension  Home meds: No antihypertensives medications prior to admission.  Stable   Hyperlipidemia  Home meds:  Crestor 20 mg daily resumed in hospital  LDL 80, goal < 70  Continue statin at discharge    Other Stroke Risk Factors  Advanced age  Hx stroke/TIA  Family hx stroke (father)   Other Active Problems  Multiple contusions  Mild dehydration  PLAN  Plan 30 day event monitor after discharge.  Anticoagulation will be considered if atrial fibrillation is documented.  DNR  The stroke team will sign off at this time. His call if we can be of further service.  Follow-up with Dr. [name] in 2 months.  Continue aspirin / Plavix therapy for now.  Inpatient rehabilitation pending insurance approval.  Hospital day # [place] PA-C [facility] Pager [phone] 04/05/2015, 1:43 PM     I have personally examined this patient, reviewed notes, independently viewed imaging studies, participated in medical decision making and plan of care. I have made any additions or clarifications directly to the above note. Agree with note above. He presented with a fall. leadng to left frontal and periorbital contusion and left hemiparesis secondary to right MCA branch infarct likely of embolic etiology.  Continue aspirin.. Consider 30 day outpatient cardiac monitor at discharge   Antony Contras, MD    To contact Stroke Continuity provider, please refer to [email or web address] After hours, contact  General Neurology

## 2015-04-05 NOTE — Progress Notes (Signed)
[facility] PROGRESS NOTE  Nicholas Elliott OFB:[id number] DOB: 09-06-1922 DOA: 04/02/2015 PCP: [name], MD  Summary 81 year old white male independent presented with acute left-sided weakness, found to have a stroke.  Assessment/Plan:  Principal Problem:   Stroke, likely embolic. Discussed with Dr. [name].  Questionable anticoagulation candidate. Carotid Dopplers without significant stenosis. Echocardiogram without source of embolus.  Hemoglobin A1c 6.1. LDL 80.  PT OT has recommended CIR. Patient prefers to go to [place] facility because all of his friends are there.  Speech therapy has altered diet to dysphagia 3 with honey thickened liquids. Modified barium swallow is pending. Social work consulted.  Will need approval from [facility] which apparently can only occur on weekdays.  Neurology recommends 30 day event monitor to be arranged upon follow-up. No need to arrange now. Neurology will follow up in the office in 2 months. Active Problems:   High cholesterol: On statin   CLL (chronic lymphocytic leukemia)   Left hip pain: xray ok   Forehead contusion   Left elbow contusion   Hyperglycemia:  No known history of diabetes. Hemoglobin A1c 6.1 Left chest contusion Moderate aortic stenosis  HPI/Subjective: Left-sided weakness improving. Refuses inpatient rehabilitation. See above.  Objective: Filed Vitals:   04/05/15 1439  BP: 113/59  Pulse: 72  Temp: 98.2 F (36.8 C)  Resp: 19   No intake or output data in the 24 hours ending 04/05/15 1449 Filed Weights   04/02/15 2005 04/02/15 2331  Weight: 77.111 kg (170 lb) 79.6 kg (175 lb 7.8 oz)    Exam:   General:  Elderly white male alert and appropriate.  HEEN T: Contusion over left brow. Swelling much improved  Cardiovascular: Regular rate rhythm without murmurs gallops rubs  Respiratory: Clear to auscultation bilaterally without wheezes rhonchi or rales  Abdomen: Soft nontender  nondistended  Ext: Left hip with contusion. Left elbow with ecchymoses. Left pretibial area with a dressing.  Neurologic: Left facial droop present. Left arm strength for out of 5. Left leg strength 4 out of 5 (both improved  Basic Metabolic Panel:  Recent Labs Lab 04/02/15 1952 04/02/15 1958  NA 140 143  K 4.2 4.0  CL 107 106  CO2 24  --   GLUCOSE 209* 203*  BUN 21* 25*  CREATININE 0.97 0.80  CALCIUM 8.9  --    Liver Function Tests:  Recent Labs Lab 04/02/15 1952  AST 44*  ALT 22  ALKPHOS 48  BILITOT 0.6  PROT 6.2*  ALBUMIN 3.9   No results for input(s): LIPASE, AMYLASE in the last 168 hours. No results for input(s): AMMONIA in the last 168 hours. CBC:  Recent Labs Lab 04/02/15 1952 04/02/15 1958  WBC 33.7*  --   NEUTROABS 10.8*  --   HGB 14.4 16.3  HCT 43.9 48.0  MCV 98.0  --   PLT 126*  --    Cardiac Enzymes: No results for input(s): CKTOTAL, CKMB, CKMBINDEX, TROPONINI in the last 168 hours. BNP (last 3 results)  Recent Labs  04/03/15 0450  BNP 280.1*    ProBNP (last 3 results) No results for input(s): PROBNP in the last 8760 hours.  CBG:  Recent Labs Lab 04/02/15 1945 04/04/15 0616 04/05/15 0559  GLUCAP 190* 83 171*    No results found for this or any previous visit (from the past 240 hour(s)).   Studies: Dg Hip Unilat With Pelvis Min 4 Views Left  04/03/2015   CLINICAL DATA:  Fall with complaints of left  hip pain no obvious bruising or abrasions pt is difficult to understand and appears to have weakness in left side. C/o chiefly of neck pain.  EXAM: DG HIP (WITH OR WITHOUT PELVIS) 4+V LEFT  COMPARISON:  None.  FINDINGS: There is no evidence of hip fracture or dislocation. There is no evidence of arthropathy or other focal bone abnormality.  IMPRESSION: Negative.   Electronically Signed   By: Lajean Manes M.D.   On: 04/03/2015 17:16   Echocardiogram Left ventricle: The cavity size is small. There was severe concentric hypertrophy.  Systolic function was normal. The estimated ejection fraction was in the range of 60% to 65%. Wall motion was normal; there were no regional wall motion abnormalities. Doppler parameters are consistent with abnormal left ventricular relaxation (grade 1 diastolic dysfunction). The E/e&' ratio is >15, suggesting elevated LV filling pressure. - Aortic valve: Moderately calcified. There is moderate stenosis. There was mild regurgitation. Mean gradient (S): 16 mm Hg. Peak gradient (S): 25 mm Hg. Valve area (VTI): 1.1 cm^2. - Mitral valve: Heavy MAC. Mild regurgitation. - Left atrium: Moderately dilated at 43 ml/m2.  Impressions:  - LVEF 60-65%, severe LVH, diastolic dysfunction, elevated LV filling pressure, heavily calcified aortic valve with moderate aortic stenosis and mild regurgitation, heavy MAC with mild regurgitation, moderate LAE. Scheduled Meds: . acidophilus  1 capsule Oral Daily  . aspirin EC  81 mg Oral Daily  . clopidogrel  75 mg Oral Daily  . digoxin  0.125 mg Oral Daily  . gabapentin  300 mg Oral TID  . heparin  5,000 Units Subcutaneous 3 times per day  . multivitamin with minerals  1 tablet Oral Daily  . rosuvastatin  20 mg Oral Daily  . vitamin B-12  100 mcg Oral Daily   Continuous Infusions:   Time spent: 25 minutes  [facility] [email or web address], password [facility] 04/05/2015, 2:49 PM  LOS: 3 days

## 2015-04-06 DIAGNOSIS — E780 Pure hypercholesterolemia: Secondary | ICD-10-CM

## 2015-04-06 DIAGNOSIS — C9110 Chronic lymphocytic leukemia of B-cell type not having achieved remission: Secondary | ICD-10-CM

## 2015-04-06 DIAGNOSIS — I639 Cerebral infarction, unspecified: Secondary | ICD-10-CM

## 2015-04-06 LAB — GLUCOSE, CAPILLARY: Glucose-Capillary: 102 mg/dL — ABNORMAL HIGH (ref 65–99)

## 2015-04-06 NOTE — Clinical Social Work Note (Signed)
Clinical Social Work Assessment  Patient Details  Name: Nicholas Elliott MRN: [id number] Date of Birth: 1921/10/18  Date of referral:  04/06/15               Reason for consult:  Facility Placement                Permission sought to share information with:  Family Supports, Other (POA: [place]) Permission granted to share information::     Name::        Agency::     Relationship::     Contact Information:     Housing/Transportation Living arrangements for the past 2 months:  Single Family Home Source of Information:  Patient, Spouse Patient Interpreter Needed:  None Criminal Activity/Legal Involvement Pertinent to Current Situation/Hospitalization:  No - Comment as needed Significant Relationships:  Spouse Lives with:  Spouse Do you feel safe going back to the place where you live?  Yes Need for family participation in patient care:  Yes (Comment)  Care giving concerns: Patient and wife agree that he will be going to SNF for rehab. Patient states that he would prefer [facility] but it was ok to fax his information to other facilities in [place].    Social Worker assessment / plan:  CSW will seek bed placement in [facility] Preferably at [facility]  Employment status:  Retired Nurse, adult PT Recommendations:  [place] / Referral to community resources:  [place]  Patient/Family's Response to care: Wife and patient agreeable and looking forward to good outcomes.   Patient/Family's Understanding of and Emotional Response to Diagnosis, Current Treatment, and Prognosis: Patient and wife seem to anticipate that rehab will be useful and effective.   Emotional Assessment Appearance:  Appears stated age Attitude/Demeanor/Rapport:   (Friendly) Affect (typically observed):  Accepting, Appropriate Orientation:  Oriented to Self, Oriented to Place, Oriented to Situation, Oriented to   Time Alcohol / Substance use:  Not Applicable Psych involvement (Current and /or in the community):  No (Comment)  Discharge Needs  Concerns to be addressed:  No discharge needs identified Readmission within the last 30 days:  No Current discharge risk:  None Barriers to Discharge:  No Barriers Identified   Christene Lye, LCSW 04/06/2015, 5:31 PM

## 2015-04-06 NOTE — Clinical Social Work Placement (Signed)
   CLINICAL SOCIAL WORK PLACEMENT  NOTE  Date:  04/06/2015  Patient Details  Name: Nicholas Elliott MRN: [id number] Date of Birth: 1922/04/22  Clinical Social Work is seeking post-discharge placement for this patient at the [place] level of care (*CSW will initial, date and re-position this form in  chart as items are completed):  No   Patient/family provided with [place] Work Department's list of facilities offering this level of care within the geographic area requested by the patient (or if unable, by the patient's family).  Yes   Patient/family informed of their freedom to choose among providers that offer the needed level of care, that participate in Medicare, Medicaid or managed care program needed by the patient, have an available bed and are willing to accept the patient.  Yes   Patient/family informed of [place]'s ownership interest in [facility] and [facility], as well as of the fact that they are under no obligation to receive care at these facilities.  PASRR submitted to EDS on 04/06/15     PASRR number received on 04/06/15     Existing PASRR number confirmed on       FL2 transmitted to all facilities in geographic area requested by pt/family on 04/06/15     FL2 transmitted to all facilities within larger geographic area on 04/06/15     Patient informed that his/her managed care company has contracts with or will negotiate with certain facilities, including the following:            Patient/family informed of bed offers received.  Patient chooses bed at       Physician recommends and patient chooses bed at      Patient to be transferred to   on  .  Patient to be transferred to facility by       Patient family notified on   of transfer.  Name of family member notified:        PHYSICIAN       Additional Comment:    _______________________________________________ Christene Lye, LCSW 04/06/2015, 5:54  PM

## 2015-04-06 NOTE — Progress Notes (Signed)
[facility] PROGRESS NOTE  ALLAH REASON MVH:[id number] DOB: [birth year] DOA: 04/02/2015 PCP: [name], MD  Summary 81 year old white male independent presented with acute left-sided weakness, found to have a stroke.  Assessment/Plan:    Stroke, likely embolic.  -Dr. [name].   -Questionable anticoagulation candidate: 30 day event monitor and if shows a fib will anticoagulate -Carotid Dopplers without significant stenosis. Echocardiogram without source of embolus.  Hemoglobin A1c 6.1. LDL 80.   -PT OT has recommended CIR. Patient prefers to go to [place] facility because all of his friends are there.  Speech therapy has altered diet to dysphagia 3 with honey thickened liquids. Modified barium swallow is pending. Social work consulted.  Will need approval from [facility] which apparently can only occur on weekdays.   - Neurology will follow up in the office in 2 months.    High cholesterol: On statin   CLL (chronic lymphocytic leukemia)   Left hip pain: xray ok   Forehead contusion   Left elbow contusion   Hyperglycemia:  No known history of diabetes. Hemoglobin A1c 6.1 Left chest contusion Moderate aortic stenosis  HPI/Subjective: Refuses inpatient rehabilitation No CP, no SOB  Objective: Filed Vitals:   04/06/15 0500  BP: 124/69  Pulse: 76  Temp: 98.4 F (36.9 C)  Resp: 16    Intake/Output Summary (Last 24 hours) at 04/06/15 0738 Last data filed at 04/06/15 0357  Gross per 24 hour  Intake      0 ml  Output   1050 ml  Net  -1050 ml   Filed Weights   04/02/15 2005 04/02/15 2331  Weight: 77.111 kg (170 lb) 79.6 kg (175 lb 7.8 oz)    Exam:   General:  Elderly white male alert and appropriate.  Cardiovascular: Regular rate rhythm without murmurs gallops rubs  Respiratory: Clear to auscultation bilaterally without wheezes rhonchi or rales  Abdomen: Soft nontender nondistended  Ext: Left hip with contusion. Left elbow with  ecchymoses. Left pretibial area with a dressing.  Neurologic: Left facial droop present. Left arm strength 5 out of 5. Left leg strength 4 out of 5  Basic Metabolic Panel:  Recent Labs Lab 04/02/15 1952 04/02/15 1958  NA 140 143  K 4.2 4.0  CL 107 106  CO2 24  --   GLUCOSE 209* 203*  BUN 21* 25*  CREATININE 0.97 0.80  CALCIUM 8.9  --    Liver Function Tests:  Recent Labs Lab 04/02/15 1952  AST 44*  ALT 22  ALKPHOS 48  BILITOT 0.6  PROT 6.2*  ALBUMIN 3.9   No results for input(s): LIPASE, AMYLASE in the last 168 hours. No results for input(s): AMMONIA in the last 168 hours. CBC:  Recent Labs Lab 04/02/15 1952 04/02/15 1958  WBC 33.7*  --   NEUTROABS 10.8*  --   HGB 14.4 16.3  HCT 43.9 48.0  MCV 98.0  --   PLT 126*  --    Cardiac Enzymes: No results for input(s): CKTOTAL, CKMB, CKMBINDEX, TROPONINI in the last 168 hours. BNP (last 3 results)  Recent Labs  04/03/15 0450  BNP 280.1*    ProBNP (last 3 results) No results for input(s): PROBNP in the last 8760 hours.  CBG:  Recent Labs Lab 04/02/15 1945 04/04/15 0616 04/05/15 0559 04/06/15 0610  GLUCAP 190* 83 171* 102*    No results found for this or any previous visit (from the past 240 hour(s)).   Studies: No results found. Echocardiogram Left ventricle:  The cavity size is small. There was severe concentric hypertrophy. Systolic function was normal. The estimated ejection fraction was in the range of 60% to 65%. Wall motion was normal; there were no regional wall motion abnormalities. Doppler parameters are consistent with abnormal left ventricular relaxation (grade 1 diastolic dysfunction). The E/e&' ratio is >15, suggesting elevated LV filling pressure. - Aortic valve: Moderately calcified. There is moderate stenosis. There was mild regurgitation. Mean gradient (S): 16 mm Hg. Peak gradient (S): 25 mm Hg. Valve area (VTI): 1.1 cm^2. - Mitral valve: Heavy MAC. Mild  regurgitation. - Left atrium: Moderately dilated at 43 ml/m2.  Impressions:  - LVEF 60-65%, severe LVH, diastolic dysfunction, elevated LV filling pressure, heavily calcified aortic valve with moderate aortic stenosis and mild regurgitation, heavy MAC with mild regurgitation, moderate LAE.  Scheduled Meds: . acidophilus  1 capsule Oral Daily  . aspirin EC  81 mg Oral Daily  . clopidogrel  75 mg Oral Daily  . digoxin  0.125 mg Oral Daily  . gabapentin  300 mg Oral TID  . heparin  5,000 Units Subcutaneous 3 times per day  . multivitamin with minerals  1 tablet Oral Daily  . rosuvastatin  20 mg Oral Daily  . vitamin B-12  100 mcg Oral Daily   Continuous Infusions:   Time spent: 25 minutes  Eulogio Bear  [facility] [phone] [email or web address], password [facility] 04/06/2015, 7:38 AM  LOS: 4 days

## 2015-04-07 ENCOUNTER — Inpatient Hospital Stay (HOSPITAL_COMMUNITY): Payer: Medicare PPO

## 2015-04-07 ENCOUNTER — Encounter
Admission: RE | Admit: 2015-04-07 | Discharge: 2015-04-07 | Disposition: A | Discharge status: Home / Self Care | Payer: Medicare PPO | Source: Ambulatory Visit | Attending: Internal Medicine | Admitting: Internal Medicine

## 2015-04-07 DIAGNOSIS — R739 Hyperglycemia, unspecified: Secondary | ICD-10-CM

## 2015-04-07 LAB — BASIC METABOLIC PANEL
Anion gap: 6 (ref 5–15)
BUN: 23 mg/dL — AB (ref 6–20)
CALCIUM: 8.8 mg/dL — AB (ref 8.9–10.3)
CO2: 28 mmol/L (ref 22–32)
CREATININE: 0.76 mg/dL (ref 0.61–1.24)
Chloride: 109 mmol/L (ref 101–111)
GFR calc Af Amer: >60 mL/min (ref >60)
GFR calc non Af Amer: >60 mL/min (ref >60)
Glucose, Bld: 117 mg/dL — ABNORMAL HIGH (ref 65–99)
Potassium: 4.6 mmol/L (ref 3.5–5.1)
Sodium: 143 mmol/L (ref 135–145)

## 2015-04-07 LAB — GLUCOSE, CAPILLARY: Glucose-Capillary: 87 mg/dL (ref 65–99)

## 2015-04-07 NOTE — Care Management Important Message (Signed)
Important Message  Patient Details  Name: Nicholas Elliott MRN: [id number] Date of Birth: [birth year]   Medicare Important Message Given:  Yes-third notification given    Delorse Lek 04/07/2015, 1:47 PM

## 2015-04-07 NOTE — Progress Notes (Signed)
Pt had 6 beats run of VTACH, Pt is asymptomatic. Already sent a message to on call NP. Waiting for response, will continue to monitor

## 2015-04-07 NOTE — Clinical Social Work Note (Signed)
CSW spoke with Joelene Millin at [facility]. Joelene Millin reported that she can offer the pt a bed. CSW asked Joelene Millin to start insurance authorization. CSW also paged PT to inform them that a PT note is needed for the insurance authorization. CSW will continue to provide support and assist with discharge.   Alpha, MSW, [place]

## 2015-04-07 NOTE — Progress Notes (Signed)
Rehab admissions - Evaluated for possible admission.  Patient prefers [facility] for his rehab.  Social worker is in pursuit of SNF placement and patient/family are in agreement to SNF placement.  Call me for questions.  #[phone]

## 2015-04-07 NOTE — Progress Notes (Signed)
Physical Therapy Treatment Patient Details Name: Nicholas Elliott MRN: [id number] DOB: 04-09-1922 Today's Date: 04/07/2015    History of Present Illness Pt is a 81 y.o. M s/p acute patchy multi focal cortical R MCA infarcts w/ resultant dysarthria and LUE weakness.  Pt found by sister on kitchen floor.  [name] PMH includes TIA, arthritis, glaucoma, CLL (chronic lmyphocytic leukemia).    PT Comments    Pt is progressing towards goals. Pt demonstrated increased walking tolerance, 400 ft with Min A using a FW for [name] first lap and HHA for [name] second lap.Pt shows mild left side neglect as noticed during Gait with a left side drift. Skilled PT is recommended to improve Left UE.strength and improve function mobility to be ready for DC.  Follow Up Recommendations  CIR;Supervision/Assistance - 24 hour     Equipment Recommendations       Recommendations for Other Services OT consult;Rehab consult     Precautions / Restrictions Precautions Precautions: Fall Restrictions Weight Bearing Restrictions: No    Mobility  Bed Mobility Overal bed mobility: Needs Assistance Bed Mobility: Supine to Sit     Supine to sit: Min assist;HOB elevated     General bed mobility comments: Help Pt with lowering LE's off bed and Min VC's for hand placement to push up from bed to sit up.  Transfers Overall transfer level: Needs assistance Equipment used: Rolling walker (2 wheeled);1 person hand held assist Transfers: Sit to/from Stand Sit to Stand: Min assist         General transfer comment: Pt stood both using RW and on a second attempt using HHA. Both where Min A. Pt unable to psuh up from chair/bed due to L UE weakness.  Ambulation/Gait Ambulation/Gait assistance: Min assist Ambulation Distance (Feet): 200 Feet (x2) Assistive device: Rolling walker (2 wheeled);1 person hand held assist Gait Pattern/deviations: Narrow base of support;Drifts right/left;Step-through pattern;Shuffle;Decreased  stride length Gait velocity: slow Gait velocity interpretation: Below normal speed for age/gender General Gait Details: Pt first lap was using a RW, then second lap was HHA +1 . Pt with decreased awareness of obstacles on L requiring min v/c's to manage. pt unsafe with RW due to inabiltiy to hold onto it with L UE, improved with R HHA, will try [facility] next session   Stairs            Wheelchair Mobility    Modified Rankin (Stroke Patients Only) Modified Rankin (Stroke Patients Only) Pre-Morbid Rankin Score: No symptoms Modified Rankin: Moderately severe disability     Balance Overall balance assessment: Needs assistance Sitting-balance support: No upper extremity supported;Feet supported Sitting balance-Leahy Scale: Fair Sitting balance - Comments: Pt with improved sitting balance due to ablity to sit with no UE support.   Standing balance support: Bilateral upper extremity supported;During functional activity Standing balance-Leahy Scale: Fair Standing balance comment: fair static standing however requires assists for ambulation.                    Cognition Arousal/Alertness: Awake/alert Behavior During Therapy: WFL for tasks assessed/performed Overall Cognitive Status: Impaired/Different from baseline Area of Impairment: Awareness;Following commands Orientation Level:  (Pt was fully orientanted ) Current Attention Level: Focused   Following Commands: Follows one step commands consistently;Follows one step commands with increased time Safety/Judgement: Decreased awareness of safety;Decreased awareness of deficits Awareness: Intellectual (mild left side unattendtion)   General Comments: Pt mild Left side negect    Exercises      General Comments General comments (skin integrity,  edema, etc.): Left leg bandages where coming off at start of treatment. Therapist reaplied before therapy session started. VISION ASSESSMENT: L eye stops at midline when tracking Left.  suspect L field cut      Pertinent Vitals/Pain Pain Assessment: Faces (Simultaneous filing. User may not have seen previous data.) Faces Pain Scale: Hurts little more Pain Location: L eye and L Wrist Pain Descriptors / Indicators: Sore Pain Intervention(s): Monitored during session    Home Living                      Prior Function            PT Goals (current goals can now be found in the care plan section) Acute Rehab PT Goals Patient Stated Goal: not reported Progress towards PT goals: Progressing toward goals (Simultaneous filing. User may not have seen previous data.)    Frequency  Min 4X/week    PT Plan Current plan remains appropriate    Co-evaluation             End of Session Equipment Utilized During Treatment: Gait belt Activity Tolerance: Patient tolerated treatment well Patient left: in chair;with call bell/phone within reach;with chair alarm set     Time: 1031-1104 PT Time Calculation (min) (ACUTE ONLY): 33 min  Charges:  $Gait Training: 23-37 mins                    G Codes:      Nicholas Elliott April 17, 2015, 12:24 PM   Nicholas Elliott (student physical therapy assistant) Acute Rehab [phone]

## 2015-04-07 NOTE — Progress Notes (Signed)
[facility] PROGRESS NOTE  Nicholas Elliott NLZ:[id number] DOB: 10/09/1921 DOA: 04/02/2015 PCP: [name], MD  Summary 81 year old white male independent presented with acute left-sided weakness, found to have a stroke.  Assessment/Plan:    Stroke, likely embolic.  -Dr. [name] has seen -Questionable anticoagulation candidate: 30 day event monitor and if shows a fib will anticoagulate -Carotid Dopplers without significant stenosis. Echocardiogram without source of embolus.  Hemoglobin A1c 6.1. LDL 80.   -PT OT has recommended CIR. Patient prefers to go to [facility] facility because all of his friends are there.  Speech therapy has altered diet to dysphagia 3 with honey thickened liquids. Modified barium swallow is pending. Social work consulted.  Will need approval from [facility] which apparently can only occur on weekdays.   - Neurology will follow up in the office in 2 months.    High cholesterol: On statin   CLL (chronic lymphocytic leukemia)   Left hip pain: xray ok   Forehead contusion   Left elbow contusion   Hyperglycemia:  No known history of diabetes. Hemoglobin A1c 6.1 Left chest contusion Moderate aortic stenosis  HPI/Subjective: Refuses inpatient rehabilitation Await insurance authorizations  Objective: Filed Vitals:   04/07/15 0526  BP: 119/48  Pulse: 73  Temp: 98.6 F (37 C)  Resp:     Intake/Output Summary (Last 24 hours) at 04/07/15 0928 Last data filed at 04/06/15 1135  Gross per 24 hour  Intake      0 ml  Output    200 ml  Net   -200 ml   Filed Weights   04/02/15 2005 04/02/15 2331  Weight: 77.111 kg (170 lb) 79.6 kg (175 lb 7.8 oz)    Exam:   General:  Elderly white male alert and appropriate.  Cardiovascular: Regular rate rhythm without murmurs gallops rubs  Respiratory: Clear to auscultation bilaterally without wheezes rhonchi or rales  Abdomen: Soft nontender nondistended  Neurologic: Left facial  droop present. Left arm strength 5 out of 5. Left leg strength 4 out of 5  Basic Metabolic Panel:  Recent Labs Lab 04/02/15 1952 04/02/15 1958  NA 140 143  K 4.2 4.0  CL 107 106  CO2 24  --   GLUCOSE 209* 203*  BUN 21* 25*  CREATININE 0.97 0.80  CALCIUM 8.9  --    Liver Function Tests:  Recent Labs Lab 04/02/15 1952  AST 44*  ALT 22  ALKPHOS 48  BILITOT 0.6  PROT 6.2*  ALBUMIN 3.9   No results for input(s): LIPASE, AMYLASE in the last 168 hours. No results for input(s): AMMONIA in the last 168 hours. CBC:  Recent Labs Lab 04/02/15 1952 04/02/15 1958  WBC 33.7*  --   NEUTROABS 10.8*  --   HGB 14.4 16.3  HCT 43.9 48.0  MCV 98.0  --   PLT 126*  --    Cardiac Enzymes: No results for input(s): CKTOTAL, CKMB, CKMBINDEX, TROPONINI in the last 168 hours. BNP (last 3 results)  Recent Labs  04/03/15 0450  BNP 280.1*    ProBNP (last 3 results) No results for input(s): PROBNP in the last 8760 hours.  CBG:  Recent Labs Lab 04/02/15 1945 04/04/15 0616 04/05/15 0559 04/06/15 0610 04/07/15 0519  GLUCAP 190* 83 171* 102* 87    No results found for this or any previous visit (from the past 240 hour(s)).   Studies: No results found. Echocardiogram Left ventricle: The cavity size is small. There was severe concentric hypertrophy. Systolic function  was normal. The estimated ejection fraction was in the range of 60% to 65%. Wall motion was normal; there were no regional wall motion abnormalities. Doppler parameters are consistent with abnormal left ventricular relaxation (grade 1 diastolic dysfunction). The E/e&' ratio is >15, suggesting elevated LV filling pressure. - Aortic valve: Moderately calcified. There is moderate stenosis. There was mild regurgitation. Mean gradient (S): 16 mm Hg. Peak gradient (S): 25 mm Hg. Valve area (VTI): 1.1 cm^2. - Mitral valve: Heavy MAC. Mild regurgitation. - Left atrium: Moderately dilated at 43  ml/m2.  Impressions:  - LVEF 60-65%, severe LVH, diastolic dysfunction, elevated LV filling pressure, heavily calcified aortic valve with moderate aortic stenosis and mild regurgitation, heavy MAC with mild regurgitation, moderate LAE.  Scheduled Meds: . acidophilus  1 capsule Oral Daily  . aspirin EC  81 mg Oral Daily  . clopidogrel  75 mg Oral Daily  . digoxin  0.125 mg Oral Daily  . gabapentin  300 mg Oral TID  . heparin  5,000 Units Subcutaneous 3 times per day  . multivitamin with minerals  1 tablet Oral Daily  . rosuvastatin  20 mg Oral Daily  . vitamin B-12  100 mcg Oral Daily   Continuous Infusions:   Time spent: 25 minutes  Eulogio Bear  [facility] [phone] [email or web address], password [facility] 04/07/2015, 9:28 AM  LOS: 5 days

## 2015-04-07 NOTE — Progress Notes (Signed)
MBSS complete. Full report located under chart review in imaging section.  Nicholas Elliott, M.A. CCC-SLP [phone]  

## 2015-04-08 LAB — GLUCOSE, CAPILLARY: GLUCOSE-CAPILLARY: 93 mg/dL (ref 65–99)

## 2015-04-08 LAB — MAGNESIUM: Magnesium: 2.2 mg/dL (ref 1.7–2.4)

## 2015-04-08 MED ORDER — RESOURCE THICKENUP CLEAR PO POWD: ORAL | Status: DC

## 2015-04-08 MED ORDER — CYANOCOBALAMIN 100 MCG PO TABS: 100.0000 ug | ORAL_TABLET | Freq: Every day | ORAL | Status: DC

## 2015-04-08 MED ORDER — GABAPENTIN 300 MG PO CAPS: 300.0000 mg | ORAL_CAPSULE | Freq: Three times a day (TID) | ORAL | Status: DC

## 2015-04-08 MED ORDER — HYDROCODONE-ACETAMINOPHEN 5-325 MG PO TABS: 1.0000 | ORAL_TABLET | Freq: Four times a day (QID) | ORAL | Status: DC | PRN

## 2015-04-08 NOTE — Progress Notes (Signed)
Speech Language Pathology Treatment: Dysphagia;Cognitive-Linquistic  Patient Details Name: Nicholas Elliott MRN: [id number] DOB: 07/08/1922 Today's Date: 04/08/2015 Time: 8757-9728 SLP Time Calculation (min) (ACUTE ONLY): 20 min  Assessment / Plan / Recommendation Clinical Impression  Pt required Mod cues for recall of results and recommendations from MBS on previous date. Min cues were provided throughout PO intake for consistent use of strategies. Pt had a delayed throat clear x2 following completion of PO intake concerning for possible penetration, however no wet vocal quality was noted throughout intake and throat clear was effective on MBS at clearing the laryngeal vestibule. Given that he remains afebrile and without change in lung sounds, would continue current diet.   HPI Other Pertinent Information: 81 y.o. 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  Pt very active, independent PTA.  Lives alone, plays golf twice a week, has support network of friends.  Hx also includes cleft palate with repair at [facility] in 1951 - speech at baseline is marked by hypernasal resonance, impacting speech clarity.     Pertinent Vitals Pain Assessment: No/denies pain  SLP Plan  Continue with current plan of care    Recommendations Diet recommendations: Dysphagia 2 (fine chop);Honey-thick liquid Liquids provided via: Teaspoon Medication Administration: Crushed with puree Supervision: Patient able to self feed;Full supervision/cueing for compensatory strategies Compensations: Slow rate;Small sips/bites;Hard cough after swallow Postural Changes and/or Swallow Maneuvers: Upright 30-60 min after meal;Seated upright 90 degrees       Oral Care Recommendations: Oral care BID Follow up Recommendations: Skilled Nursing facility Plan: Continue with current plan of care       [name], M.A. CCC-SLP [phone]  [name] 04/08/2015, 3:00 PM

## 2015-04-08 NOTE — Clinical Social Work Placement (Signed)
   CLINICAL SOCIAL WORK PLACEMENT  NOTE  Date:  04/08/2015  Patient Details  Name: Nicholas Elliott MRN: [id number] Date of Birth: 08-Jul-1922  Clinical Social Work is seeking post-discharge placement for this patient at the [place] level of care (*CSW will initial, date and re-position this form in  chart as items are completed):  No   Patient/family provided with [place] Work Department's list of facilities offering this level of care within the geographic area requested by the patient (or if unable, by the patient's family).  Yes   Patient/family informed of their freedom to choose among providers that offer the needed level of care, that participate in Medicare, Medicaid or managed care program needed by the patient, have an available bed and are willing to accept the patient.  Yes   Patient/family informed of [facility]'s ownership interest in [facility] and [facility], as well as of the fact that they are under no obligation to receive care at these facilities.  PASRR submitted to EDS on 04/06/15     PASRR number received on 04/06/15     Existing PASRR number confirmed on       FL2 transmitted to all facilities in geographic area requested by pt/family on 04/06/15     FL2 transmitted to all facilities within larger geographic area on 04/06/15     Patient informed that his/her managed care company has contracts with or will negotiate with certain facilities, including the following:        Yes   Patient/family informed of bed offers received.  Patient chooses bed at [facility]     Physician recommends and patient chooses bed at      Patient to be transferred to [facility] on 04/08/15.  Patient to be transferred to facility by [facility]      Patient family notified on 04/08/15 of transfer.  Name of family member notified:  Pt reported that he notified his family already     PHYSICIAN       Additional Comment:     _______________________________________________ Greta Doom, LCSW 04/08/2015, 11:11 AM

## 2015-04-08 NOTE — Discharge Summary (Signed)
Physician Discharge Summary  Nicholas Elliott DHR:[id number] DOB: [birth year] DOA: 04/02/2015  PCP: Nicholas Pitch, MD  Admit date: 04/02/2015 Discharge date: 04/08/2015  Time spent: 35 minutes  Recommendations for Outpatient Follow-up:  1. Outpatient 30 day event monitor 2. SNF- [facility]  Discharge Diagnoses:  Principal Problem:   Stroke Active Problems:   High cholesterol   Arthritis   Migraine   CLL (chronic lymphocytic leukemia)   Left hip pain   Forehead contusion   Left elbow contusion   Hyperglycemia   Discharge Condition: improved  Diet recommendation: DYS 2 honey thick  Filed Weights   04/02/15 2005 04/02/15 2331  Weight: 77.111 kg (170 lb) 79.6 kg (175 lb 7.8 oz)    History of present illness:  Nicholas Elliott is a 81 y.o. 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  Patient is accompanied by his friend, Nicholas Elliott. Per his friend, patient was last known well at 0900 this morning. Nicholas Elliott called him at about 3:00, when he did not answer. She became concerned and went to his house where she was found lying in the kitchen floor. Patient states he remembers falling and hit on table top, but was unsure why he fell. Patient was noted to have slurred speech, left-sided weakness and left facial droop. His slurred speech is old issue which may be slightly worse than baseline per Nicholas Elliott. Patient has bruising and swelling around left eye and swelling of left wrist. He also has small skin tear over left leg laterally. Patient does not have chest pain, cough, shortness of breath, abdominal pain, diarrhea,.   In ED, patient was found to have negative CT head for acute abnormalities, WBC 33.7, INR 1.07, PTT 23, UDS negative, urinalysis negative, temperature normal, tachycardia 104, electrolytes  okay. Patient is admitted to inpatient for further evaluation and treatment. Neurology was consulted by ED.   Hospital Course:  Stroke, likely embolic.  -Dr. [name] has seen -Questionable anticoagulation candidate: 30 day event monitor and if shows a fib will anticoagulate -Carotid Dopplers without significant stenosis. Echocardiogram without source of embolus. Hemoglobin A1c 6.1. LDL 80.  -PT OT has recommended CIR. Patient prefers to go to [facility] facility because all of his friends are there. Speech therapy has altered diet to dysphagia 3 with honey thickened liquids. Modified barium swallow is pending. Social work consulted. Will need approval from [facility] which apparently can only occur on weekdays.  - Neurology will follow up in the office in 2 months.   High cholesterol: On statin  CLL (chronic lymphocytic leukemia)  Left hip pain: xray ok  Forehead contusion  Left elbow contusion  Hyperglycemia: No known history of diabetes. Hemoglobin A1c 6.1 Left chest contusion Moderate aortic stenosis   Procedures:  Echo/carotid  Consultations:    Discharge Exam: Filed Vitals:   04/08/15 0514  BP: 116/54  Pulse: 75  Temp: 98.2 F (36.8 C)  Resp: 20    General: speech difficult to understand, pleasant/cooperative Cardiovascular: rrr Respiratory: clear  Discharge Instructions   Discharge Instructions    Ambulatory referral to Neurology    Complete by:  As directed   Dr. [name] requests follow up for this patient in 2 months.     Discharge instructions    Complete by:  As directed   DYS 2 honey thick     Increase  activity slowly    Complete by:  As directed           Current Discharge Medication List    START taking these medications   Details  Maltodextrin-Xanthan Gum (Ubly) POWD As needed      CONTINUE these medications which have CHANGED   Details  gabapentin (NEURONTIN) 300 MG capsule Take 1  capsule (300 mg total) by mouth 3 (three) times daily.    HYDROcodone-acetaminophen (NORCO/VICODIN) 5-325 MG per tablet Take 1 tablet by mouth every 6 (six) hours as needed for moderate pain. Qty: 10 tablet, Refills: 0    vitamin B-12 100 MCG tablet Take 1 tablet (100 mcg total) by mouth daily.      CONTINUE these medications which have NOT CHANGED   Details  albuterol (PROAIR HFA) 108 (90 BASE) MCG/ACT inhaler Inhale into the lungs every 6 (six) hours as needed for wheezing or shortness of breath.     rosuvastatin (CRESTOR) 20 MG tablet Take 20 mg by mouth at bedtime.    aspirin EC 81 MG tablet Take 81 mg by mouth daily.    clopidogrel (PLAVIX) 75 MG tablet Take 75 mg by mouth at bedtime.     digoxin (LANOXIN) 0.125 MG tablet Take 0.125 mg by mouth daily.     Misc Natural Products (OSTEO BI-FLEX JOINT SHIELD) TABS Take 1 tablet by mouth 2 (two) times daily.    Multiple Vitamins-Minerals (CENTRUM SILVER ADULT 50+ PO) Take by mouth daily.    simethicone (PHAZYME) 125 MG chewable tablet Chew 125 mg by mouth 2 (two) times daily.      STOP taking these medications     furosemide (LASIX) 20 MG tablet      DULoxetine (CYMBALTA) 30 MG capsule      aspirin 81 MG tablet      Probiotic Product ([place] PO)        No Known Allergies Follow-up Information    Follow up with Nicholas Pitch, MD In 1 week.   Specialty:  Family Medicine   Contact information:   [address] [address] [phone]       Follow up with SETHI,PRAMOD, MD In 2 months.   Specialties:  Neurology, Radiology   Contact information:   [address] [place] [address] [phone]        The results of significant diagnostics from this hospitalization (including imaging, microbiology, ancillary and laboratory) are listed below for reference.    Significant Diagnostic Studies: Dg Wrist Complete Left  25-Apr-2015   CLINICAL DATA:  Recent fall with left wrist  pain, initial encounter  EXAM: LEFT WRIST - COMPLETE 3+ VIEW  COMPARISON:  None.  FINDINGS: Degenerative changes of the first [facility] joint are seen. Mild osteopenia is noted. No acute fracture or dislocation is noted.  IMPRESSION: No acute abnormality noted.   Electronically Signed   By: Inez Catalina M.D.   On: 04-25-15 20:47   Ct Head Wo Contrast  25-Apr-2015   CLINICAL DATA:  Acute onset slurred speech, left-sided weakness, and left-sided facial droop. Fall onto floor. Head and neck injury. Facial pain and swelling. Initial encounter.  EXAM: CT HEAD WITHOUT CONTRAST  CT MAXILLOFACIAL WITHOUT CONTRAST  CT CERVICAL SPINE WITHOUT CONTRAST  TECHNIQUE: Multidetector CT imaging of the head, cervical spine, and maxillofacial structures were performed using the standard protocol without intravenous contrast. Multiplanar CT image reconstructions of the cervical spine and maxillofacial structures were also generated.  COMPARISON:  Head CT on 12/14/2012  FINDINGS: CT HEAD FINDINGS  There is no evidence of intracranial hemorrhage, brain edema, or other signs of acute infarction. There is no evidence of intracranial mass lesion or mass effect. No abnormal extraaxial fluid collections are identified.  Mild cerebral atrophy and associated ventriculomegaly are stable. Mild chronic small vessel disease again demonstrated. Mild frontal scalp soft tissue swelling is noted. No evidence of skull fracture or pneumocephalus.  CT MAXILLOFACIAL FINDINGS  Left supraorbital and preseptal soft tissue swelling is seen. Globes and other intraorbital anatomy are normal in appearance.  No evidence of orbital or facial bone fracture. No evidence of orbital emphysema or sinus air-fluid levels.  CT CERVICAL SPINE FINDINGS  No evidence of acute fracture, subluxation, or prevertebral soft tissue swelling.  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  Moderate  degenerative disc disease demonstrated at levels of C4-5, C6-7, and C7-T1. Mild to moderate bilateral facet DJD also noted. Advanced atlantoaxial degenerative changes also seen as well as mild cervical kyphoscoliosis.  IMPRESSION: Frontal scalp soft tissue swelling. No evidence of skull fracture or acute intracranial abnormality. Stable mild cerebral atrophy and chronic small vessel disease.  Left supraorbital and preseptal soft tissue swelling. No evidence of orbital or facial bone flexure.  No evidence of acute cervical spine fracture or subluxation. Degenerative spondylosis and congenital fusions, as described above.   Electronically Signed   By: Earle Gell M.D.   On: 04/02/2015 20:23   Ct Cervical Spine Wo Contrast  04/02/2015   CLINICAL DATA:  Acute onset slurred speech, left-sided weakness, and left-sided facial droop. Fall onto floor. Head and neck injury. Facial pain and swelling. Initial encounter.  EXAM: CT HEAD WITHOUT CONTRAST  CT MAXILLOFACIAL WITHOUT CONTRAST  CT CERVICAL SPINE WITHOUT CONTRAST  TECHNIQUE: Multidetector CT imaging of the head, cervical spine, and maxillofacial structures were performed using the standard protocol without intravenous contrast. Multiplanar CT image reconstructions of the cervical spine and maxillofacial structures were also generated.  COMPARISON:  Head CT on 12/14/2012  FINDINGS: CT HEAD FINDINGS  There is no evidence of intracranial hemorrhage, brain edema, or other signs of acute infarction. There is no evidence of intracranial mass lesion or mass effect. No abnormal extraaxial fluid collections are identified.  Mild cerebral atrophy and associated ventriculomegaly are stable. Mild chronic small vessel disease again demonstrated. Mild frontal scalp soft tissue swelling is noted. No evidence of skull fracture or pneumocephalus.  CT MAXILLOFACIAL FINDINGS  Left supraorbital and preseptal soft tissue swelling is seen. Globes and other intraorbital anatomy are normal  in appearance.  No evidence of orbital or facial bone fracture. No evidence of orbital emphysema or sinus air-fluid levels.  CT CERVICAL SPINE FINDINGS  No evidence of acute fracture, subluxation, or prevertebral soft tissue swelling.  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  Moderate degenerative disc disease demonstrated at levels of C4-5, C6-7, and C7-T1. Mild to moderate bilateral facet DJD also noted. Advanced atlantoaxial degenerative changes also seen as well as mild cervical kyphoscoliosis.  IMPRESSION: Frontal scalp soft tissue swelling. No evidence of skull fracture or acute intracranial abnormality. Stable mild cerebral atrophy and chronic small vessel disease.  Left supraorbital and preseptal soft tissue swelling. No evidence of orbital or facial bone flexure.  No evidence of acute cervical spine fracture or  subluxation. Degenerative spondylosis and congenital fusions, as described above.   Electronically Signed   By: Earle Gell M.D.   On: 04/02/2015 20:23   Mr Jodene Nam Head Wo Contrast  04/02/2015   CLINICAL DATA:  Initial evaluation for left-sided deficit, slurred speech.  EXAM: MRI HEAD WITHOUT CONTRAST  MRA HEAD WITHOUT CONTRAST  TECHNIQUE: Multiplanar, multiecho pulse sequences of the brain and surrounding structures were obtained without intravenous contrast. Angiographic images of the head were obtained using MRA technique without contrast.  COMPARISON:  Prior CT from earlier the same day.  FINDINGS: MRI HEAD FINDINGS  Diffuse prominence of the CSF containing spaces is compatible with generalized cerebral atrophy. No significant white matter disease present for patient age. Small remote bilateral cerebellar infarcts present.  There are patchy multi focal wall predominantly cortical areas of ischemic infarction involving the right MCA territory. These involve the right insular cortex, operculum, and posterior  right frontal region, including the right in precentral gyrus. Associated gyral swelling and edema on T2/FLAIR weighted sequences. No significant mass effect. There is associated petechial hemorrhage within the insular cortex (series 9, image 15). No evidence for frank hemorrhagic transformation. No other infarct. Intravascular flow voids maintained.  No mass lesion or midline shift. No mass effect. Ventricular prominence related to global parenchymal volume loss present without hydrocephalus. No extra-axial fluid collection.  Craniocervical junction within normal limits. Degenerative changes present about the C1-2 articulation. Pituitary gland normal.  No acute abnormality about the orbits. Globes are directed to the right. Sequelae of prior bilateral lens extraction noted.  Small amount of layering opacity within the right sphenoid sinus. Mild mucosal thickening within the maxillary sinuses bilaterally, right greater than left. Minimal mucosal thickening within the ethmoidal air cells as well. The scattered opacity present within the bilateral mastoid air cells. Inner ear structures within normal limits.  Bone marrow signal intensity normal. Scalp edema present within the left parietal scalp. Edema also noted within the left periorbital region.  MRA HEAD FINDINGS  ANTERIOR CIRCULATION:  Visualized distal cervical segments of the internal carotid arteries are widely patent with antegrade flow. The petrous, cavernous, and supra clinoid segments are widely patent as well. A1 segments, anterior communicating artery, and anterior cerebral arteries well opacified.  Right M1 segment widely patent without stenosis or occlusion. Right MCA bifurcation normal. Short-segment moderate stenosis within a proximal right M2 branch at the base of the right sylvian fissure (series 603, image 6 3). No proximal right M2 branch occlusion. Right MCA branches opacified distally.  There is focal moderate to severe stenosis within the  distal left M1 segment just prior to the left MCA bifurcation. This stenosis measures approximately 4-5 mm in length. MCA bifurcation itself is within normal limits. Distal left MCA branches well opacified.  POSTERIOR CIRCULATION:  Vertebral arteries are widely patent to the vertebrobasilar junction. Right vertebral artery is dominant. Posterior inferior cerebellar arteries well opacified. There is a short-segment moderate stenosis within the proximal basilar artery (series 605, image 7). This stenosis measures approximately 3 mm in length. Basilar artery widely patent distally. No basilar tip aneurysm or stenosis. Superior cerebellar arteries patent proximally. Both posterior cerebral arteries arise from the basilar artery. Posterior cerebral arteries are opacified to their distal aspects. A small right posterior communicating artery present.  No aneurysm or vascular malformation.  IMPRESSION: MRI HEAD IMPRESSION:  1. Patchy multi focal cortical right MCA territory infarcts as above. There is associated petechial hemorrhage within the right insular cortex without evidence of hemorrhagic transformation.  2. Age-related cerebral atrophy with small remote bilateral cerebellar infarcts.  MRA HEAD IMPRESSION:  1. No large vessel or proximal branch occlusion identified. 2. Short segment moderate stenosis within the proximal right M2 branch at the base of the sylvian fissure. 3. Focal moderate to severe stenosis of the distal left M1 segment. 4. Short-segment moderate stenosis within the proximal basilar artery   Electronically Signed   By: Jeannine Boga M.D.   On: 04/02/2015 23:48   Mr Brain Wo Contrast  04/02/2015   CLINICAL DATA:  Initial evaluation for left-sided deficit, slurred speech.  EXAM: MRI HEAD WITHOUT CONTRAST  MRA HEAD WITHOUT CONTRAST  TECHNIQUE: Multiplanar, multiecho pulse sequences of the brain and surrounding structures were obtained without intravenous contrast. Angiographic images of the  head were obtained using MRA technique without contrast.  COMPARISON:  Prior CT from earlier the same day.  FINDINGS: MRI HEAD FINDINGS  Diffuse prominence of the CSF containing spaces is compatible with generalized cerebral atrophy. No significant white matter disease present for patient age. Small remote bilateral cerebellar infarcts present.  There are patchy multi focal wall predominantly cortical areas of ischemic infarction involving the right MCA territory. These involve the right insular cortex, operculum, and posterior right frontal region, including the right in precentral gyrus. Associated gyral swelling and edema on T2/FLAIR weighted sequences. No significant mass effect. There is associated petechial hemorrhage within the insular cortex (series 9, image 15). No evidence for frank hemorrhagic transformation. No other infarct. Intravascular flow voids maintained.  No mass lesion or midline shift. No mass effect. Ventricular prominence related to global parenchymal volume loss present without hydrocephalus. No extra-axial fluid collection.  Craniocervical junction within normal limits. Degenerative changes present about the C1-2 articulation. Pituitary gland normal.  No acute abnormality about the orbits. Globes are directed to the right. Sequelae of prior bilateral lens extraction noted.  Small amount of layering opacity within the right sphenoid sinus. Mild mucosal thickening within the maxillary sinuses bilaterally, right greater than left. Minimal mucosal thickening within the ethmoidal air cells as well. The scattered opacity present within the bilateral mastoid air cells. Inner ear structures within normal limits.  Bone marrow signal intensity normal. Scalp edema present within the left parietal scalp. Edema also noted within the left periorbital region.  MRA HEAD FINDINGS  ANTERIOR CIRCULATION:  Visualized distal cervical segments of the internal carotid arteries are widely patent with antegrade  flow. The petrous, cavernous, and supra clinoid segments are widely patent as well. A1 segments, anterior communicating artery, and anterior cerebral arteries well opacified.  Right M1 segment widely patent without stenosis or occlusion. Right MCA bifurcation normal. Short-segment moderate stenosis within a proximal right M2 branch at the base of the right sylvian fissure (series 603, image 6 3). No proximal right M2 branch occlusion. Right MCA branches opacified distally.  There is focal moderate to severe stenosis within the distal left M1 segment just prior to the left MCA bifurcation. This stenosis measures approximately 4-5 mm in length. MCA bifurcation itself is within normal limits. Distal left MCA branches well opacified.  POSTERIOR CIRCULATION:  Vertebral arteries are widely patent to the vertebrobasilar junction. Right vertebral artery is dominant. Posterior inferior cerebellar arteries well opacified. There is a short-segment moderate stenosis within the proximal basilar artery (series 605, image 7). This stenosis measures approximately 3 mm in length. Basilar artery widely patent distally. No basilar tip aneurysm or stenosis. Superior cerebellar arteries patent proximally. Both posterior cerebral arteries arise from the basilar artery. Posterior  cerebral arteries are opacified to their distal aspects. A small right posterior communicating artery present.  No aneurysm or vascular malformation.  IMPRESSION: MRI HEAD IMPRESSION:  1. Patchy multi focal cortical right MCA territory infarcts as above. There is associated petechial hemorrhage within the right insular cortex without evidence of hemorrhagic transformation. 2. Age-related cerebral atrophy with small remote bilateral cerebellar infarcts.  MRA HEAD IMPRESSION:  1. No large vessel or proximal branch occlusion identified. 2. Short segment moderate stenosis within the proximal right M2 branch at the base of the sylvian fissure. 3. Focal moderate to  severe stenosis of the distal left M1 segment. 4. Short-segment moderate stenosis within the proximal basilar artery   Electronically Signed   By: Jeannine Boga M.D.   On: 04/02/2015 23:48   Dg Swallowing Func-speech Pathology  04/07/2015    Objective Swallowing Evaluation:    Patient Details  Name: Nicholas Elliott MRN: [id number] Date of Birth: 07-02-1922  Today's Date: 04/07/2015 Time: SLP Start Time (ACUTE ONLY): 1138-SLP Stop Time (ACUTE ONLY): 1201 SLP Time Calculation (min) (ACUTE ONLY): 23 min  Past Medical History:  Past Medical History  Diagnosis Date  . High cholesterol   . TIA (transient ischemic attack)   . Arthritis   . Glaucoma   . Migraine   . CLL (chronic lymphocytic leukemia)    Past Surgical History:  Past Surgical History  Procedure Laterality Date  . Upper palate reparin    . Spine surgery     HPI:  Other Pertinent Information: 81 y.o. male with hx TIA, migraines, presents  after fall with a neurological syndrome consistent with a right brain  infarct, likely cortical distribution right MCA  per neuro.  Pt very  active, independent PTA.  Lives alone, plays golf twice a week, has  support network of friends.  Hx also includes cleft palate with repair at  [facility] in 1951 - speech at baseline is marked by hypernasal resonance,  impacting speech clarity.    No Data Recorded  Assessment / Plan / Recommendation CHL IP CLINICAL IMPRESSIONS 04/07/2015  Therapy Diagnosis Moderate pharyngeal phase dysphagia   Clinical Impression Pt has a moderate pharyngeal dysphagia with  sensorimotor deficits. His swallow trigger is delayed, with thickened  liquids reaching the pyriform sinuses prior to swallow initiation. As a  result, nectar thick liquids are aspirated with reflexive, weak cough that  is ineffective at expelling aspirates. Honey thick liquids  and solids are  not aspirated before the swallow, however diffuse pharyngeal weakness  leads to moderate residuals at the valleculae, posterior pharyngeal  wall,  and pyriform sinuses. Head turns and chin tucks do not seem to impact  clearance of residuals, although cued multiple swallows effectively  decrease the amount. Silent penetration is observed with residual  material, which is likely a combination of solid and honey thick liquids.  With bolus management for small, single bites and spoonfuls of honey thick  liquids, penetration is reduced in nature. Pt is then able to use a cued  throat clear to clear trace, shallow penetrates from the laryngeal  vestibule. Given the above, and that pt appears to have been tolerating  current diet, would favor Dys 2 diet to aid in pharyngeal clearance and  honey thick liquids by spoon only. Would use multiple swallows and throat  clearing post-swallow to decrease aspiration risk.      CHL IP TREATMENT RECOMMENDATION 04/07/2015  Treatment Recommendations Therapy as outlined in treatment plan below     CHL  IP DIET RECOMMENDATION 04/07/2015  SLP Diet Recommendations Dysphagia 2 (Fine chop);Honey  Liquid Administration via (None)  Medication Administration Crushed with puree  Compensations Slow rate;Small sips/bites;Hard cough after swallow  Postural Changes and/or Swallow Maneuvers (None)     CHL IP OTHER RECOMMENDATIONS 04/07/2015  Recommended Consults (None)  Oral Care Recommendations Oral care BID  Other Recommendations Order thickener from pharmacy;Prohibited food  (jello, ice cream, thin soups);Remove water pitcher     CHL IP FOLLOW UP RECOMMENDATIONS 04/05/2015  Follow up Recommendations Skilled Nursing facility;Other (comment)     CHL IP FREQUENCY AND DURATION 04/07/2015  Speech Therapy Frequency (ACUTE ONLY) min 2x/week  Treatment Duration 2 weeks     Pertinent Vitals/Pain: n./a     SLP Swallow Goals     CHL IP REASON FOR REFERRAL 04/07/2015  Reason for Referral Objectively evaluate swallowing function     CHL IP ORAL PHASE 04/07/2015  Oral Phase WFL      CHL IP PHARYNGEAL PHASE 04/07/2015  Pharyngeal Phase Impaired      CHL IP  CERVICAL ESOPHAGEAL PHASE 04/07/2015  Cervical Esophageal Phase Impaired        [name], M.A. CCC-SLP [phone]  [name] 04/07/2015, 2:07 PM    Dg Hip Unilat With Pelvis Min 4 Views Left  04/03/2015   CLINICAL DATA:  Fall with complaints of left hip pain no obvious bruising or abrasions pt is difficult to understand and appears to have weakness in left side. C/o chiefly of neck pain.  EXAM: DG HIP (WITH OR WITHOUT PELVIS) 4+V LEFT  COMPARISON:  None.  FINDINGS: There is no evidence of hip fracture or dislocation. There is no evidence of arthropathy or other focal bone abnormality.  IMPRESSION: Negative.   Electronically Signed   By: Lajean Manes M.D.   On: 04/03/2015 17:16   Ct Maxillofacial Wo Cm  04/02/2015   CLINICAL DATA:  Acute onset slurred speech, left-sided weakness, and left-sided facial droop. Fall onto floor. Head and neck injury. Facial pain and swelling. Initial encounter.  EXAM: CT HEAD WITHOUT CONTRAST  CT MAXILLOFACIAL WITHOUT CONTRAST  CT CERVICAL SPINE WITHOUT CONTRAST  TECHNIQUE: Multidetector CT imaging of the head, cervical spine, and maxillofacial structures were performed using the standard protocol without intravenous contrast. Multiplanar CT image reconstructions of the cervical spine and maxillofacial structures were also generated.  COMPARISON:  Head CT on 12/14/2012  FINDINGS: CT HEAD FINDINGS  There is no evidence of intracranial hemorrhage, brain edema, or other signs of acute infarction. There is no evidence of intracranial mass lesion or mass effect. No abnormal extraaxial fluid collections are identified.  Mild cerebral atrophy and associated ventriculomegaly are stable. Mild chronic small vessel disease again demonstrated. Mild frontal scalp soft tissue swelling is noted. No evidence of skull fracture or pneumocephalus.  CT MAXILLOFACIAL FINDINGS  Left supraorbital and preseptal soft tissue swelling is seen. Globes and other intraorbital anatomy are  normal in appearance.  No evidence of orbital or facial bone fracture. No evidence of orbital emphysema or sinus air-fluid levels.  CT CERVICAL SPINE FINDINGS  No evidence of acute fracture, subluxation, or prevertebral soft tissue swelling.  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  Moderate degenerative disc disease demonstrated at levels of C4-5, C6-7, and C7-T1. Mild to moderate bilateral facet DJD also noted. Advanced atlantoaxial degenerative changes also seen as well as mild cervical kyphoscoliosis.  IMPRESSION: Frontal scalp  soft tissue swelling. No evidence of skull fracture or acute intracranial abnormality. Stable mild cerebral atrophy and chronic small vessel disease.  Left supraorbital and preseptal soft tissue swelling. No evidence of orbital or facial bone flexure.  No evidence of acute cervical spine fracture or subluxation. Degenerative spondylosis and congenital fusions, as described above.   Electronically Signed   By: Earle Gell M.D.   On: 04/02/2015 20:23    Microbiology: No results found for this or any previous visit (from the past 240 hour(s)).   Labs: Basic Metabolic Panel:  Recent Labs Lab 04/02/15 1952 04/02/15 1958 04/07/15 1025 04/08/15 0455  NA 140 143 143  --   K 4.2 4.0 4.6  --   CL 107 106 109  --   CO2 24  --  28  --   GLUCOSE 209* 203* 117*  --   BUN 21* 25* 23*  --   CREATININE 0.97 0.80 0.76  --   CALCIUM 8.9  --  8.8*  --   MG  --   --   --  2.2   Liver Function Tests:  Recent Labs Lab 04/02/15 1952  AST 44*  ALT 22  ALKPHOS 48  BILITOT 0.6  PROT 6.2*  ALBUMIN 3.9   No results for input(s): LIPASE, AMYLASE in the last 168 hours. No results for input(s): AMMONIA in the last 168 hours. CBC:  Recent Labs Lab 04/02/15 1952 04/02/15 1958  WBC 33.7*  --   NEUTROABS 10.8*  --   HGB 14.4 16.3  HCT 43.9 48.0  MCV 98.0  --   PLT 126*  --    Cardiac  Enzymes: No results for input(s): CKTOTAL, CKMB, CKMBINDEX, TROPONINI in the last 168 hours. BNP: BNP (last 3 results)  Recent Labs  04/03/15 0450  BNP 280.1*    ProBNP (last 3 results) No results for input(s): PROBNP in the last 8760 hours.  CBG:  Recent Labs Lab 04/04/15 0616 04/05/15 0559 04/06/15 0610 04/07/15 0519 04/08/15 0514  GLUCAP 83 171* 102* 87 93       Signed:  Melinda Pottinger  [facility] 04/08/2015, 9:23 AM

## 2015-04-08 NOTE — Progress Notes (Signed)
Occupational Therapy Treatment Patient Details Name: Nicholas Elliott MRN: [id number] DOB: Dec 24, 1921 Today's Date: 04/08/2015    History of present illness Pt is a 81 y.o. M s/p acute patchy multi focal cortical R MCA infarcts w/ resultant dysarthria and LUE weakness.  Pt found by sister on kitchen floor.  [name] PMH includes TIA, arthritis, glaucoma, CLL (chronic lmyphocytic leukemia).   OT comments  Pt progressing towards acute OT goals. Focus of session was incorporating LUE into transfers and ADLs as detailed below. D/c plan updated to SNF. OT to continue to follow.   Follow Up Recommendations  SNF    Equipment Recommendations  Other (comment) (defer to next venue)    Recommendations for Other Services      Precautions / Restrictions Precautions Precautions: Fall Precaution Comments: Pt reports "near blacking out episode" ~2 wks ago while he was lying in bed that dissipated after ~30 seconds.  Pt denies any other falls in the past year. Restrictions Weight Bearing Restrictions: No       Mobility Bed Mobility Overal bed mobility: Needs Assistance Bed Mobility: Rolling;Sidelying to Sit Rolling: Min guard Sidelying to sit: Mod assist       General bed mobility comments: Extra time and use of bed rails for rolling to left. Mod A and cues for technique to powerup to EOB.  Transfers Overall transfer level: Needs assistance Equipment used: Rolling walker (2 wheeled);1 person hand held assist Transfers: Sit to/from [facility] Sit to Stand: Mod assist Stand pivot transfers: Min assist       General transfer comment: Inital use of +1 HHA ultimately rw and +1 assist due to weak LUE grasp. Mod A during powerup. Min A for balance and assist to LUE. Pivitol steps to chair.    Balance Overall balance assessment: Needs assistance;History of Falls Sitting-balance support: Bilateral upper extremity supported;Feet supported Sitting balance-Leahy Scale:  Fair Sitting balance - Comments: BUE support while sitting EOB. Did complete brief dynamic sitting task with no external support and no LOB.    Standing balance support: Bilateral upper extremity supported;During functional activity Standing balance-Leahy Scale: Fair Standing balance comment: Initially tried 1+ HHA to sit<>stand from EOB. Sit<>stnd completed with rw use iwth cues for technique and addidtional assist to LUE due to weak grasp.                    ADL Overall ADL's : Needs assistance/impaired     Grooming: Wash/dry hands;Wash/dry face;Sitting;Minimal assistance Grooming Details (indicate cue type and reason): initial cues for ways to to incorporate LUE into bilateral tasks. spontaneous use of  left hand to wash face.                              Functional mobility during ADLs: Minimal assistance General ADL Comments: Pt completed SPT with pivitol steps to transfer from EOB to recliner. Mod A for sit<>stand and Min A for SPT. Pt completed grooming task in sitting position (washing face and hands) using wet washcloth to provide light restance exercise for LUE.      Vision                     Perception     Praxis      Cognition   Behavior During Therapy: [facility] for tasks assessed/performed Overall Cognitive Status: Impaired/Different from baseline Area of Impairment: Following commands;Safety/judgement;Attention;Problem solving        Following  Commands: Follows one step commands consistently;Follows one step commands with increased time Safety/Judgement: Decreased awareness of safety;Decreased awareness of deficits   Problem Solving: Difficulty sequencing General Comments: Pt mild Left side negect    Extremity/Trunk Assessment               Exercises     Shoulder Instructions       General Comments      Pertinent Vitals/ Pain       Pain Assessment: No/denies pain  Home Living                                           Prior Functioning/Environment              Frequency Min 3X/week     Progress Toward Goals  OT Goals(current goals can now be found in the care plan section)  Progress towards OT goals: Progressing toward goals  Acute Rehab OT Goals Patient Stated Goal: not reported OT Goal Formulation: With patient Time For Goal Achievement: 04/18/15 ADL Goals Pt Will Perform Grooming: sitting;with set-up;with supervision Pt Will Perform Upper Body Dressing: sitting;with min assist Pt Will Transfer to Toilet: stand pivot transfer;bedside commode;with min guard assist Pt Will Perform Toileting - Clothing Manipulation and hygiene: with min assist;sit to/from stand  Plan Discharge plan needs to be updated    Co-evaluation                 End of Session Equipment Utilized During Treatment: Gait belt;Rolling walker   Activity Tolerance Patient tolerated treatment well   Patient Left in chair;with call bell/phone within reach;with chair alarm set;Other (comment) (with PT)   Nurse Communication          Time: 8110-3159 OT Time Calculation (min): 22 min  Charges: OT General Charges $OT Visit: 1 Procedure OT Treatments $Self Care/Home Management : 8-22 mins  Hortencia Pilar 04/08/2015, 11:34 AM

## 2015-04-08 NOTE — Progress Notes (Signed)
Physical Therapy Treatment Patient Details Name: TAMOTSU WIEDERHOLT MRN: [id number] DOB: 03-21-1922 Today's Date: 04/08/2015    History of Present Illness Pt is a 81 y.o. M s/p acute patchy multi focal cortical R MCA infarcts w/ resultant dysarthria and LUE weakness.  Pt found by sister on kitchen floor.  [name] PMH includes TIA, arthritis, glaucoma, CLL (chronic lmyphocytic leukemia).    PT Comments    Pt is progressing towards goals. Pt has improved ambulation mobility by switching from a RW to [facility] due to decreased ability to weight bear through L UE. Pt continues to requires Min guard Assist. Pt still has some mental impairments, safety awareness, and increased time to complete one step commands. Pt will continue to benefit from Skilled PT to improve UE strength needed to further max functional mobility for DC to SNF.  Follow Up Recommendations  Supervision/Assistance - 24 hour;SNF (desires [facility])     Equipment Recommendations       Recommendations for Other Services OT consult;Rehab consult     Precautions / Restrictions Precautions Precautions: Fall Restrictions Weight Bearing Restrictions: No    Mobility  Bed Mobility Overal bed mobility: Needs Assistance Bed Mobility: Rolling;Sit to Supine;Supine to Sit Rolling: Min guard Sidelying to sit:  (increased time to complete task) Supine to sit: Min guard;HOB elevated Sit to supine: Min guard;HOB elevated   General bed mobility comments: Pt able to preform proper bed mobilty techniques but requires min VC for hand placement, increased to complete task.  Transfers Overall transfer level: Needs assistance Equipment used: 1 person hand held assist Transfers: Sit to/from Stand Sit to Stand: Mod assist         General transfer comment: Mod A to stand from chair with no AD, however therapsit provided HHA  +1 stand pt  Ambulation/Gait Ambulation/Gait assistance: Min assist Ambulation Distance (Feet): 200  Feet Assistive device: Straight cane Gait Pattern/deviations: Narrow base of support;Decreased stride length;Shuffle;Step-to pattern Gait velocity: slow Gait velocity interpretation: Below normal speed for age/gender General Gait Details: trialed cane this date, min v/c's required for proper sequencing, pt with improved gait tolerance and stability with [facility]    Stairs            Wheelchair Mobility    Modified Rankin (Stroke Patients Only) Modified Rankin (Stroke Patients Only) Pre-Morbid Rankin Score: No symptoms Modified Rankin: Moderately severe disability     Balance Overall balance assessment: Needs assistance Sitting-balance support: No upper extremity supported;Feet supported Sitting balance-Leahy Scale: Fair Sitting balance - Comments: Pt is able to sit with no UE support.   Standing balance support: Single extremity supported;During functional activity (Pt standing using SPC) Standing balance-Leahy Scale: Fair Standing balance comment: safe to stand with SPC                    Cognition Arousal/Alertness: Awake/alert Behavior During Therapy: WFL for tasks assessed/performed Overall Cognitive Status: Impaired/Different from baseline Area of Impairment: Safety/judgement;Attention   Current Attention Level: Sustained   Following Commands: Follows one step commands with increased time Safety/Judgement: Decreased awareness of safety;Decreased awareness of deficits Awareness: Emergent   General Comments: Pt with mild L side neglect,     Exercises      General Comments General comments (skin integrity, edema, etc.): Pt c/o mid back itch throught entire treatment.      Pertinent Vitals/Pain Pain Assessment: No/denies pain    Home Living  Prior Function            PT Goals (current goals can now be found in the care plan section) Acute Rehab PT Goals Patient Stated Goal: not reported Progress towards PT goals:  Progressing toward goals    Frequency  Min 3X/week    PT Plan Current plan remains appropriate;Frequency needs to be updated    Co-evaluation             End of Session Equipment Utilized During Treatment: Gait belt Activity Tolerance: Patient tolerated treatment well Patient left: in bed;with call bell/phone within reach;with bed alarm set     Time: 3744-5146 PT Time Calculation (min) (ACUTE ONLY): 18 min  Charges:  $Gait Training: 8-22 mins                    G Codes:      Renaee Munda 04/14/2015, 3:09 PM   Lequita Halt (student physical therapy assistant) Acute Rehab [phone]

## 2015-04-11 DIAGNOSIS — I480 Paroxysmal atrial fibrillation: Secondary | ICD-10-CM | POA: Insufficient documentation

## 2015-04-11 DIAGNOSIS — I639 Cerebral infarction, unspecified: Secondary | ICD-10-CM | POA: Insufficient documentation

## 2015-04-11 DIAGNOSIS — R55 Syncope and collapse: Secondary | ICD-10-CM

## 2015-04-11 HISTORY — DX: Cerebral infarction, unspecified: I63.9

## 2015-04-11 HISTORY — DX: Paroxysmal atrial fibrillation: I48.0

## 2015-04-11 HISTORY — DX: Syncope and collapse: R55

## 2015-04-21 ENCOUNTER — Encounter
Admission: RE | Admit: 2015-04-21 | Discharge: 2015-04-21 | Disposition: A | Discharge status: Home / Self Care | Payer: Medicare PPO | Source: Ambulatory Visit | Attending: Internal Medicine | Admitting: Internal Medicine

## 2015-04-24 ENCOUNTER — Other Ambulatory Visit: Payer: Self-pay | Admitting: Internal Medicine

## 2015-04-24 DIAGNOSIS — I69391 Dysphagia following cerebral infarction: Secondary | ICD-10-CM

## 2015-04-24 DIAGNOSIS — R1310 Dysphagia, unspecified: Secondary | ICD-10-CM

## 2015-05-07 ENCOUNTER — Encounter: Payer: Self-pay | Admitting: Occupational Therapy

## 2015-05-07 ENCOUNTER — Encounter: Payer: Self-pay | Admitting: Speech Pathology

## 2015-05-07 ENCOUNTER — Ambulatory Visit: Admission: RE | Admit: 2015-05-07 | Payer: Medicare PPO | Source: Ambulatory Visit

## 2015-05-07 ENCOUNTER — Ambulatory Visit: Payer: Medicare PPO | Attending: Family Medicine | Admitting: Occupational Therapy

## 2015-05-07 ENCOUNTER — Ambulatory Visit: Payer: Medicare PPO | Admitting: Speech Pathology

## 2015-05-07 DIAGNOSIS — R279 Unspecified lack of coordination: Secondary | ICD-10-CM | POA: Diagnosis present

## 2015-05-07 DIAGNOSIS — I6980 Unspecified sequelae of other cerebrovascular disease: Secondary | ICD-10-CM | POA: Diagnosis present

## 2015-05-07 DIAGNOSIS — Z602 Problems related to living alone: Secondary | ICD-10-CM

## 2015-05-07 DIAGNOSIS — R4689 Other symptoms and signs involving appearance and behavior: Secondary | ICD-10-CM | POA: Diagnosis present

## 2015-05-07 DIAGNOSIS — Z789 Other specified health status: Secondary | ICD-10-CM

## 2015-05-07 DIAGNOSIS — M6281 Muscle weakness (generalized): Secondary | ICD-10-CM

## 2015-05-07 DIAGNOSIS — R531 Weakness: Secondary | ICD-10-CM | POA: Insufficient documentation

## 2015-05-07 DIAGNOSIS — G8194 Hemiplegia, unspecified affecting left nondominant side: Secondary | ICD-10-CM | POA: Diagnosis present

## 2015-05-07 DIAGNOSIS — R1313 Dysphagia, pharyngeal phase: Secondary | ICD-10-CM | POA: Insufficient documentation

## 2015-05-07 DIAGNOSIS — IMO0002 Reserved for concepts with insufficient information to code with codable children: Secondary | ICD-10-CM

## 2015-05-08 NOTE — Therapy (Signed)
[place] MAIN [facility] SERVICES [address] [place], [place], [address] Phone: [phone]   Fax:  [phone]  Speech Language Pathology Evaluation  Patient Details  Name: Nicholas Elliott MRN: [id number] Date of Birth: Jan 26, 1922 Referring Provider:  [name], MD  Encounter Date: 05/07/2015      End of Session - 05/08/15 1214    Visit Number 1   Number of Visits 9   Date for SLP Re-Evaluation 06/08/15   SLP Start Time 1600   SLP Stop Time  1655   SLP Time Calculation (min) 55 min   Activity Tolerance Patient tolerated treatment well      Past Medical History  Diagnosis Date  . High cholesterol   . TIA (transient ischemic attack)   . Arthritis   . Glaucoma   . Migraine   . CLL (chronic lymphocytic leukemia)     Past Surgical History  Procedure Laterality Date  . Upper palate reparin    . Spine surgery      There were no vitals filed for this visit.  Visit Diagnosis: Pharyngeal dysphagia - Plan: SLP plan of care cert/re-cert      Subjective Assessment - 05/08/15 1212    Subjective 81 year old man hospitalized at [facility] 04/02/2015 - 04/08/2015 for stroke.  He was transferred to skilled nursing at [facility] and discharged 05/03/2015.  The patient has premorbid speech impairment secondary cleft palate.  The patient had an MBS at [facility] 04/07/2015 and has been on a Dysphagia III diet with honey-thick liquids.  He has received dysphagia therapy.  The patient is most interested in upgrading his diet to regular liquids.    Currently in Pain? No/denies       Results of Evaluation:  Modified Barium Swallow Study (04/07/2015):  Moderate pharyngeal dysphagia with delayed swallow initiation and pharyngeal residue post swallow.  The patient demonstrated aspiration of nectar-thick liquid with weak cough response.  Oral Motor / Motor Speech:  Patient with history of cleft palate repair in 1951.  Premorbid speech was  difficulty to understand and consistent with cleft palate speech (weak volume, hypernasal).  Per patient and familiar listeners, patient is at his baseline for speech.  Swallowing: The patient was scheduled for a repeat MBS this afternoon, but there was a communication error and the patient thought that this evaluation was the swallow study.  The patient states that he is compliant with dietary recommendations (Dysphagia III with honey-thick liquids) and is performing the [place] exercise regularly.  The patient was given written and verbal teaching regarding continuing this exercise and adding tongue base retraction exercise and modified Shaker exercise.  He demonstrated good performance and understanding.        SLP Education - 05/08/15 1213    Education provided Yes   Education Details New swallowing exercises, results of prior MBS   Person(s) Educated Patient;Spouse   Methods Explanation;Demonstration;Handout   Comprehension Verbalized understanding;Returned demonstration;Need further instruction            SLP Long Term Goals - 05/08/15 1218    SLP LONG TERM GOAL #1   Title Patient will independently perform selected swallowing exercises given written instructions.   Time 4   Period Weeks   Status New   SLP LONG TERM GOAL #2   Title Patient will demonstrate independent understanding and implementation of swallowing strategies.   Time 4   Period Weeks   Status New   SLP LONG TERM GOAL #3  Title Patient will demonstrate understanding of results and recommendations of MBS by verbalizing salient points.   Time 4   Period Weeks   Status New          Plan - 05/30/15 1217    Clinical Impression Statement At 5 weeks post onset of stroke, this 81 year old man (with history of 1951 repaired cleft palate and baseline "cleft palate" speech) is presenting with moderate pharyngeal dysphagia per [facility] 04/07/2015.  At that time, the patient demonstrated delayed swallow initiation,  significant pharyngeal residue post swallow, and aspiration of nectar-thick liquid with weak cough response.  The patient was scheduled for a repeat MBS this afternoon, but there was a communication error and the patient thought that this evaluation was the swallow study.  It has been rescheduled for 05/09/2015.  In the meantime, I have given the patient 2 additional swallowing exercises to address the identified problems per MBS.  The patient has been advised to continue current diet recommendations. We will determine appropriate diet consistency and swallowing precautions pending results of MBS scheduled for 05/30/15.   Speech Therapy Frequency 2x / week   Duration 4 weeks   Treatment/Interventions Aspiration precaution training;Pharyngeal strengthening exercises;Diet toleration management by SLP   Potential to Achieve Goals Good   Potential Considerations Ability to learn/carryover information;Co-morbidities;Cooperation/participation level;Medical prognosis;Previous level of function;Severity of impairments;Family/community support   SLP Home Exercise Plan Swalloing Exercises   Consulted and Agree with Plan of Care Patient;Family member/caregiver   Family Member Consulted Spouse          G-Codes - 05/30/15 1215    Functional Assessment Tool Used Clinical judgment   Functional Limitations Swallowing   Swallow Current Status [phone]) At least 40 percent but less than 60 percent impaired, limited or restricted   Swallow Goal Status (H1505) At least 1 percent but less than 20 percent impaired, limited or restricted      Problem List Patient Active Problem List   Diagnosis Date Noted  . Left hip pain 04/03/2015  . Forehead contusion 04/03/2015  . Left elbow contusion 04/03/2015  . Hyperglycemia 04/03/2015  . Facial droop 04/02/2015  . Stroke 04/02/2015  . High cholesterol   . TIA (transient ischemic attack)   . Arthritis   . Migraine   . CLL (chronic lymphocytic leukemia)    Leroy Sea, MS/CCC- SLP  Lou Miner 30-May-2015, 12:23 PM  [place] MAIN [facility] SERVICES [address] [place], [place], [address] Phone: [phone]   Fax:  [phone]

## 2015-05-09 ENCOUNTER — Ambulatory Visit
Admission: RE | Admit: 2015-05-09 | Discharge: 2015-05-09 | Disposition: A | Discharge status: Home / Self Care | Payer: Medicare PPO | Source: Ambulatory Visit | Attending: Internal Medicine | Admitting: Internal Medicine

## 2015-05-09 DIAGNOSIS — R1313 Dysphagia, pharyngeal phase: Secondary | ICD-10-CM

## 2015-05-09 DIAGNOSIS — R1310 Dysphagia, unspecified: Secondary | ICD-10-CM

## 2015-05-09 DIAGNOSIS — I69391 Dysphagia following cerebral infarction: Secondary | ICD-10-CM

## 2015-05-09 DIAGNOSIS — Z8673 Personal history of transient ischemic attack (TIA), and cerebral infarction without residual deficits: Secondary | ICD-10-CM | POA: Diagnosis not present

## 2015-05-09 DIAGNOSIS — G43909 Migraine, unspecified, not intractable, without status migrainosus: Secondary | ICD-10-CM | POA: Diagnosis not present

## 2015-05-09 NOTE — Therapy (Signed)
[place] MAIN [facility] SERVICES [place], [place], [address] Phone: [phone]   Fax:  [phone]  Occupational Therapy Evaluation  Patient Details  Name: Nicholas Elliott MRN: [id number] Date of Birth: 07-24-1922 Referring Provider:  [name], MD  Encounter Date: 05/07/2015    Past Medical History  Diagnosis Date  . High cholesterol   . TIA (transient ischemic attack)   . Arthritis   . Glaucoma   . Migraine   . CLL (chronic lymphocytic leukemia)     Past Surgical History  Procedure Laterality Date  . Upper palate reparin    . Spine surgery      There were no vitals filed for this visit.  Visit Diagnosis:  Muscle weakness - Plan: Ot plan of care cert/re-cert  Lack of coordination due to stroke - Plan: Ot plan of care cert/re-cert  Self-care deficit in patient living alone - Plan: Ot plan of care cert/re-cert  Hemiplegia affecting left nondominant side - Plan: Ot plan of care cert/re-cert    Information taken 05/07/15 1535     [facility] OT Assessment - 05/09/15 1547    Assessment   Diagnosis CVA   Prior Therapy PT, OT, SLP   Balance Screen   Has the patient fallen in the past 6 months Yes   How many times? 1 time   Has the patient had a decrease in activity level because of a fear of falling?  No   Is the patient reluctant to leave their home because of a fear of falling?  No   Home  Environment   Bathroom Shower/Tub Door;Walk-in Shower   Lives With Spouse   Prior Function   Level of Independence Independent   Vocation Retired   ADL   Eating/Feeding Modified independent   Grooming Modified independent   Upper Body Bathing Modified independent   Lower Body Bathing Modified independent   Upper Body Dressing Increased time;Needs assist for fasteners;Minimal assistance   Lower Body Dressing Increased time;Needs assist for fasteners;Supervision/safety   Toilet Tranfer Modified independent   Toileting -  Clothing Manipulation Increased time   Toileting -  Hygiene Increase time;Supervision/safety   Tub/Shower Transfer Supervision/safety   IADL   Prior Level of Function Shopping independent   Shopping Completely unable to shop   Prior Level of Function Light Housekeeping independent   Light Housekeeping Needs help with all home maintenance tasks   Prior Level of Function Meal Prep independent   Meal Prep Able to complete simple warm meal prep   Community Mobility Relies on family or friends for transportation   Medication Management Is responsible for taking medication in correct dosages at correct time   Prior Level of Function Financial Management independent   Physiological scientist financial matters independently (budgets, writes checks, pays rent, bills goes to bank), collects and keeps track of income   Mobility   Mobility Status History of falls;Needs assist   Mobility Status Comments uses cane to ambulate   Vision - History   Baseline Vision Wears glasses all the time   Coordination   Gross Motor Movements are Fluid and Coordinated No   Fine Motor Movements are Fluid and Coordinated No   Finger Nose Finger Test impaired   9 Hole Peg Test Right;Left   Right 9 Hole Peg Test 34 secs   Left 9 Hole Peg Test 10 min 31 sec   AROM   Overall AROM  Deficits   Overall AROM Comments Right shoulder  flexion limited to 125 degrees actively, left shoulder flexion to 115 degrees actively.     Strength   Overall Strength Deficits   Overall Strength Comments RUE shoulder 4/5, elbow, wrist and hand 5/5, LUE shoulder 3+/5, elbow 4-/5,    Hand Function   Right Hand Grip (lbs) 50   Right Hand Lateral Pinch 14 lbs   Right Hand 3 Point Pinch 13 lbs   Left Hand Grip (lbs) 21   Left Hand Lateral Pinch 10 lbs   Left 3 point pinch 7 lbs                         OT Education - 05/09/15 1523    Education provided Yes   Education Details left hand coordination tasks, HEP    Person(s) Educated Patient   Methods Explanation;Demonstration;Verbal cues   Comprehension Verbal cues required;Verbalized understanding;Returned demonstration             OT Long Term Goals - 05/07/15 1645    OT LONG TERM GOAL #1   Title Patient will improve LUE strength by 1 mm grade to assist with completing self care and IADL tasks with modified independence.   Baseline requiring assist from wife at eval   Time 12   Period Weeks   Status New   OT LONG TERM GOAL #2   Title Patient will improve left grip strength by 10# to open jars and containers.   Baseline difficulty opening items and requires assist.   Time 12   Period Weeks   Status New   OT LONG TERM GOAL #3   Title Patient will improve left hand pinch strength by 3# to be able to hold items bilaterally without dropping.   Baseline difficulty with holding items on left side such as plate   Time 12   Period Weeks   Status New   OT LONG TERM GOAL #4   Title Patient will improve coordination on 9 hole peg test by 3 minutes to assist with completing buttons, snaps and zippers on clothing with modified independence.   Baseline 9 hole peg test took greater than 10 minutes at eval   Time 12   Period Weeks   Status New   OT LONG TERM GOAL #5   Title Patient will complete tub/shower transfers with modified independence.    Baseline supervision   Time 12   Period Weeks   Status New   OT LONG TERM GOAL #7   Title Patient will complete light housekeeping tasks with modified independence.   Baseline requires assist at eval   Time 12   Period Weeks   Status New               Plan - 05/09/15 1543    Clinical Impression Statement Patient is a 81 year old male who suffered a CVA and was in the hospital from 04-02-15 to 04-08-15 at [facility] in [place], he then was moved to [facility] in [place] to receive rehab and was discharged on 8-13/16.  Patient was independent with all tasks prior to CVA.  He is married but  lived alone and his wife lived in a separate house.  She is currently staying with him now since he had a stroke.   Patient presents with left sided muscle weakness, decreased grip and pinch skills, decreased coordination, decreased balance/mobility and decreased ability to perform self care and IADL tasks independently.  He has not been able to return  to driving and is relying on his wife to take him to appointments.  He would benefit from skilled OT to increase his independence in daily tasks so he can possibly return to living alone.     Pt will benefit from skilled therapeutic intervention in order to improve on the following deficits (Retired) Decreased endurance;Decreased strength;Decreased balance;Decreased mobility;Difficulty walking;Decreased range of motion;Decreased coordination;Impaired UE functional use   Rehab Potential Good   OT Frequency 2x / week   OT Duration 12 weeks   OT Treatment/Interventions Self-care/ADL training;Therapeutic exercise;Patient/family education;Neuromuscular education;Balance training;DME and/or AE instruction;Therapeutic activities   Consulted and Agree with Plan of Care Patient;Family member/caregiver   Family Member Consulted wife        Problem List Patient Active Problem List   Diagnosis Date Noted  . Left hip pain 04/03/2015  . Forehead contusion 04/03/2015  . Left elbow contusion 04/03/2015  . Hyperglycemia 04/03/2015  . Facial droop 04/02/2015  . Stroke 04/02/2015  . High cholesterol   . TIA (transient ischemic attack)   . Arthritis   . Migraine   . CLL (chronic lymphocytic leukemia)    Amy Oneita Jolly, OTR/L, CLT Lovett,Amy 05/09/2015, 3:47 PM  [place] MAIN [facility] SERVICES [address] [place], [place], [address] Phone: [phone]   Fax:  [phone]

## 2015-05-09 NOTE — Therapy (Signed)
[place], [place], [address] Phone: [phone]   Fax:     Modified Barium Swallow  Patient Details  Name: Nicholas Elliott MRN: [id number] Date of Birth: 08/21/1922 Referring Provider:  Kirk Ruths, MD  Encounter Date: 05/09/2015      End of Session - 05/09/15 1347    Visit Number 1   Number of Visits 1   Date for SLP Re-Evaluation 05/09/15   SLP Start Time 1200   SLP Stop Time  4403   SLP Time Calculation (min) 57 min   Activity Tolerance Patient tolerated treatment well      Past Medical History  Diagnosis Date  . High cholesterol   . TIA (transient ischemic attack)   . Arthritis   . Glaucoma   . Migraine   . CLL (chronic lymphocytic leukemia)     Past Surgical History  Procedure Laterality Date  . Upper palate reparin    . Spine surgery      There were no vitals filed for this visit.  Visit Diagnosis: Pharyngeal dysphagia  Dysphagia - Plan: DG OP Swallowing Func-Medicare/Speech Path, DG OP Swallowing Func-Medicare/Speech Path  Dysphagia following cerebral infarction - Plan: DG OP Swallowing Func-Medicare/Speech Path, DG OP Swallowing Func-Medicare/Speech Path      Subjective Assessment - 05/08/15 1212    Subjective 81 year old man hospitalized at [facility] 04/02/2015 - 04/08/2015 for stroke.  He was transferred to skilled nursing at [facility] and discharged 05/03/2015.  The patient has premorbid speech impairment secondary cleft palate.  The patient had an MBS at [facility] 04/07/2015 and has been on a Dysphagia III diet with honey-thick liquids.  He has received dysphagia therapy.  The patient is most interested in upgrading his diet to regular liquids.    Currently in Pain? No/denies     Subjective: Patient behavior: Patient is alert, active, and able to follow directions.  Chief complaint: Patient would like to resume taking thin liquids   Objective:   Radiological Procedure: A videoflouroscopic evaluation of oral-preparatory, reflex initiation, and pharyngeal phases of the swallow was performed; as well as a screening of the upper esophageal phase.  I. POSTURE: Upright in MBS chair  II. VIEW:Lateral  III. COMPENSATORY STRATEGIES: Second swallow (aids pharyngeal clearance),  Liquid wash (aids pharyngeal clearance)  IV. BOLUSES ADMINISTERED:   Thin Liquid: 2 teaspoons, 2 small cup rim sips   Nectar-thick Liquid: 2 teaspoons, 1 cup rim sip    Puree: 2 teaspoons   Mechanical Soft: 1/4 graham cracker in applesauce  V. RESULTS OF EVALUATION: A. ORAL PREPARATORY PHASE: (The lips, tongue, and velum are observed for strength and coordination)  Within functional limits       **Overall Severity Rating: WFL  B. SWALLOW INITIATION/REFLEX: (The reflex is normal if "triggered" by the time the bolus reached the base of the tongue) Triggers at the vallecular spaces  **Overall Severity Rating: Mild  C. PHARYNGEAL PHASE: (Pharyngeal function is normal if the bolus shows rapid, smooth, and continuous transit through the pharynx and there is no pharyngeal residue after the swallow) decreased pharyngeal pressure generation (decreased tongue base retraction, reduced velopharyngeal closure, and reduced pharyngeal wall contraction), decreased hyolaryngeal movement, incomplete epiglottic inversion, decreased duration and amplitude of UES opening, and mild-to-moderate pharyngeal residue.    **Overall Severity Rating: Mild  D. LARYNGEAL PENETRATION: (Material entering into the laryngeal inlet/vestibule but not aspirated)   E. ASPIRATION: one episode of audible, trace  aspiration, not filmed, that appeared to be aspiration of pharyngeal residue.  The cough was effective in expelling aspirated material.    F. ESOPHAGEAL PHASE: (Screening of the upper esophagus) N/A  ASSESSMENT: At 5 weeks post onset of stroke, this 81 year old man (with history of stroke  04/02/2015, 1949 repaired cleft palate with baseline "cleft palate" speech, and moderate pharyngeal dysphagia per [facility] 04/07/2015) is presenting mild pharyngeal dysphagia decreased pharyngeal pressure generation (decreased tongue base retraction, reduced velopharyngeal closure, and reduced pharyngeal wall contraction), decreased hyolaryngeal movement, incomplete epiglottic inversion, decreased duration and amplitude of UES opening, and mild-to-moderate pharyngeal residue.   There was one episode of audible, trace aspiration, not filmed, that appeared to be aspiration of pharyngeal residue.  The cough was effective in expelling aspirated material.  The patient is at risk for inconsistent, trace aspiration.  However, he is very active and appears to have a protective cough response.  Recommend that the patient upgrade to thin liquids (small sips only and alternating liquids and solids while eating).  He should continue the swallowing exercises he was given 05/07/2015.  He is scheduled for a follow-up session with me on Tuesday 05/13/3015.  I will provide further education at that time.  PLAN/RECOMMENDATIONS:  A. Diet: Regular with thin liquids (SMALL sips)   B. Swallowing Precautions: Small bites and sips, alternate liquids and solids, stringent oral care   C. Recommended consultation to N/A   D. Therapy recommendations: continue ST for education, swallowing exercises   E. Results and recommendations were discussed with the patient and his wife immediately                                         following the study.  The final report will be routed to the referring MD and the patient's                                     primary care MD.          SLP Long Term Goals - 05/08/15 1218    SLP LONG TERM GOAL #1   Title Patient will independently perform selected swallowing exercises given written instructions.   Time 4   Period Weeks   Status New   SLP LONG TERM GOAL #2   Title Patient will demonstrate  independent understanding and implementation of swallowing strategies.   Time 4   Period Weeks   Status New   SLP LONG TERM GOAL #3   Title Patient will demonstrate understanding of results and recommendations of MBS by verbalizing salient points.   Time 4   Period Weeks   Status New          Plan - 05/08/15 1217        Speech Therapy Frequency 2x / week   Duration 4 weeks   Treatment/Interventions Aspiration precaution training;Pharyngeal strengthening exercises;Diet toleration management by SLP   Potential to Achieve Goals Good   Potential Considerations Ability to learn/carryover information;Co-morbidities;Cooperation/participation level;Medical prognosis;Previous level of function;Severity of impairments;Family/community support   SLP Home Exercise Plan Swalloing Exercises   Consulted and Agree with Plan of Care Patient;Family member/caregiver   Family Member Consulted Spouse          G-Codes - 05-Jun-2015 1349    Functional Assessment Tool Used MBS   Functional  Limitations Swallowing   Swallow Current Status [phone]) At least 20 percent but less than 40 percent impaired, limited or restricted   Swallow Goal Status (Y0998) At least 20 percent but less than 40 percent impaired, limited or restricted   Swallow Discharge Status [phone]) At least 20 percent but less than 40 percent impaired, limited or restricted      Problem List Patient Active Problem List   Diagnosis Date Noted  . Left hip pain 04/03/2015  . Forehead contusion 04/03/2015  . Left elbow contusion 04/03/2015  . Hyperglycemia 04/03/2015  . Facial droop 04/02/2015  . Stroke 04/02/2015  . High cholesterol   . TIA (transient ischemic attack)   . Arthritis   . Migraine   . CLL (chronic lymphocytic leukemia)    Leroy Sea, MS/CCC- SLP  Lou Miner 05/09/2015, 3:03 PM  [facility] [facility] [place], [place], [address] Phone:  [phone]   Fax:

## 2015-05-13 ENCOUNTER — Encounter: Payer: Self-pay | Admitting: Occupational Therapy

## 2015-05-13 ENCOUNTER — Ambulatory Visit: Payer: Medicare PPO | Admitting: Speech Pathology

## 2015-05-13 ENCOUNTER — Encounter: Payer: Self-pay | Admitting: Speech Pathology

## 2015-05-13 ENCOUNTER — Ambulatory Visit: Payer: Medicare PPO | Admitting: Occupational Therapy

## 2015-05-13 DIAGNOSIS — R1313 Dysphagia, pharyngeal phase: Secondary | ICD-10-CM

## 2015-05-13 DIAGNOSIS — IMO0002 Reserved for concepts with insufficient information to code with codable children: Secondary | ICD-10-CM

## 2015-05-13 DIAGNOSIS — M6281 Muscle weakness (generalized): Secondary | ICD-10-CM | POA: Diagnosis not present

## 2015-05-13 NOTE — Therapy (Signed)
[place] MAIN [facility] SERVICES [address] [place], [place], [address] Phone: [phone]   Fax:  [phone]  Speech Language Pathology Treatment/Discharge Summary  Patient Details  Name: Nicholas Elliott MRN: [id number] Date of Birth: [birth year] Referring Provider:  [name], MD  Encounter Date: 05/13/2015      End of Session - 05/13/15 1706    Visit Number 2   Number of Visits 9   Date for SLP Re-Evaluation 06/08/15   SLP Start Time 40   SLP Stop Time  4944   SLP Time Calculation (min) 47 min   Activity Tolerance Patient tolerated treatment well      Past Medical History  Diagnosis Date  . High cholesterol   . TIA (transient ischemic attack)   . Arthritis   . Glaucoma   . Migraine   . CLL (chronic lymphocytic leukemia)     Past Surgical History  Procedure Laterality Date  . Upper palate reparin    . Spine surgery      There were no vitals filed for this visit.  Visit Diagnosis: Pharyngeal dysphagia      Subjective Assessment - 05/13/15 1705    Subjective "I don't have any trouble swallowing, I can eat anything"   Patient is accompained by: Family member               ADULT SLP TREATMENT - 05/13/15 0001    General Information   Behavior/Cognition Alert;Cooperative;Pleasant mood   Treatment Provided   Treatment provided Dysphagia   Dysphagia Treatment   Other treatment/comments Reviewed the results of the MBS from 05/09/2015.  He and his wife have no further questions.  He has been released on thin liquids (SMALL sips) and regular foods as tolerated.  Reviewed the swallowing exercises given8/17/2016.  The patient and his wife have no further questions regarding how to perform the exercises or the recommended frequency.  Reviewed the risk for aspiration and discussed the pros and cons of risk vs. quality of life.  Reviewed basic strategies for maintaining swallowing health including: do the exercises;  SMALL sips; alternate liquids and solids; cough hard if needed; stringent oral care; STAY ACTIVE; consult your MD with increased congestion or unexplained fever.   Pain Assessment   Pain Assessment No/denies pain   Assessment / Recommendations / Plan   Plan Discharge SLP treatment due to (comment);All goals met   Progression Toward Goals   Progression toward goals Goals met, education completed, patient discharged from [facility] Education - 05/13/15 1705    Education provided Yes   Education Details See treatment note   Person(s) Educated Patient;Spouse   Methods Explanation;Handout   Comprehension Verbalized understanding;Returned demonstration            SLP Long Term Goals - 05/13/15 1708    SLP LONG TERM GOAL #1   Title Patient will independently perform selected swallowing exercises given written instructions.   Status Achieved   SLP LONG TERM GOAL #2   Title Patient will demonstrate independent understanding and implementation of swallowing strategies.   Status Achieved   SLP LONG TERM GOAL #3   Title Patient will demonstrate understanding of results and recommendations of MBS by verbalizing salient points.   Status Achieved          Plan - 05/13/15 1707    Clinical Impression Statement The patient and his wife have been educated regarding ongoing swallowing recommendations.  They have been educated regarding risk of aspiration / potential negative sequelae vs. quality of life.  The patient is independent in his exercises.  He and his wife are encouraged to contact me if they have any questions or concerns regarding his swallowing.   Treatment/Interventions Aspiration precaution training;Pharyngeal strengthening exercises;Diet toleration management by SLP   Potential to Achieve Goals Good   Potential Considerations Ability to learn/carryover information;Co-morbidities;Cooperation/participation level;Medical prognosis;Previous level of function;Severity of  impairments;Family/community support   SLP Home Exercise Plan Swalloing Exercises, swallowing precautions   Consulted and Agree with Plan of Care Patient;Family member/caregiver   Family Member Consulted Spouse          G-Codes - 2015-06-06 1709    Functional Assessment Tool Used MBS   Functional Limitations Swallowing   Swallow Current Status [phone]) At least 20 percent but less than 40 percent impaired, limited or restricted   Swallow Goal Status ([id number]) At least 20 percent but less than 40 percent impaired, limited or restricted   Swallow Discharge Status [phone]) At least 20 percent but less than 40 percent impaired, limited or restricted      Problem List Patient Active Problem List   Diagnosis Date Noted  . Left hip pain 04/03/2015  . Forehead contusion 04/03/2015  . Left elbow contusion 04/03/2015  . Hyperglycemia 04/03/2015  . Facial droop 04/02/2015  . Stroke 04/02/2015  . High cholesterol   . TIA (transient ischemic attack)   . Arthritis   . Migraine   . CLL (chronic lymphocytic leukemia)    Leroy Sea, MS/CCC- SLP  Lou Miner 06-06-2015, 5:11 PM  [place] MAIN [facility] SERVICES [address] [place], [place], [address] Phone: [phone]   Fax:  [phone]
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
## 2015-05-15 ENCOUNTER — Encounter: Payer: Self-pay | Admitting: *Deleted

## 2015-05-15 ENCOUNTER — Encounter: Payer: Medicare PPO | Admitting: Speech Pathology

## 2015-05-15 ENCOUNTER — Ambulatory Visit: Payer: Medicare PPO | Admitting: Occupational Therapy

## 2015-05-15 DIAGNOSIS — G8194 Hemiplegia, unspecified affecting left nondominant side: Secondary | ICD-10-CM

## 2015-05-15 DIAGNOSIS — M6281 Muscle weakness (generalized): Secondary | ICD-10-CM

## 2015-05-15 DIAGNOSIS — Z789 Other specified health status: Secondary | ICD-10-CM

## 2015-05-15 DIAGNOSIS — IMO0002 Reserved for concepts with insufficient information to code with codable children: Secondary | ICD-10-CM

## 2015-05-15 DIAGNOSIS — R4689 Other symptoms and signs involving appearance and behavior: Secondary | ICD-10-CM

## 2015-05-16 NOTE — Therapy (Signed)
[place] MAIN [facility] SERVICES [address] [place], [place], [address] Phone: [phone]   Fax:  [phone]  Occupational Therapy Treatment  Patient Details  Name: Nicholas Elliott MRN: [id number] Date of Birth: 03/17/1922 Referring Provider:  [name], MD  Encounter Date: 05/15/2015      OT End of Session - 05/15/15 2046    Visit Number 3   Number of Visits 24   Date for OT Re-Evaluation 07/30/15   Authorization Type medicare G code 3   OT Start Time 8453   OT Stop Time 1100   OT Time Calculation (min) 45 min      Past Medical History  Diagnosis Date  . High cholesterol   . TIA (transient ischemic attack)   . Arthritis   . Glaucoma   . Migraine   . CLL (chronic lymphocytic leukemia)   . Facial droop 04/02/2015  . High cholesterol   . TIA (transient ischemic attack)   . Arthritis   . Migraine   . Stroke 04/02/2015  . CLL (chronic lymphocytic leukemia)   . Left hip pain 04/03/2015  . Forehead contusion 04/03/2015  . Left elbow contusion 04/03/2015  . Hyperglycemia 04/03/2015  . A-fib 04/08/2014  . Carpal tunnel syndrome 01/23/2014  . DDD (degenerative disc disease), cervical 01/23/2014  . Cervical spinal stenosis 01/23/2014  . Cerebral vascular accident 04/11/2015  . DDD (degenerative disc disease), lumbar 09/11/2014  . Heart valve disease 04/08/2014  . HLD (hyperlipidemia) 04/08/2014  . Neuritis or radiculitis due to rupture of lumbar intervertebral disc 09/11/2014  . Degenerative arthritis of lumbar spine 01/23/2014  . AF (paroxysmal atrial fibrillation) 04/11/2015  . Nerve root pain 01/23/2014  . Episode of syncope 04/11/2015    Past Surgical History  Procedure Laterality Date  . Upper palate reparin    . Spine surgery      There were no vitals filed for this visit.  Visit Diagnosis:  Muscle weakness  Lack of coordination due to stroke  Self-care deficit in patient living alone  Hemiplegia affecting left nondominant  side      Subjective Assessment - 05/15/15 2044    Subjective  Patient reports he is doing well, recognizes the need to continue with therapy for his arm.   Patient is accompained by: Family member   Patient Stated Goals Patient would like to get back to doing all the things he did before and be independent, wants to go back to living alone.   Currently in Pain? No/denies   Pain Score 0-No pain                      OT Treatments/Exercises (OP) - 05/15/15 2046    Fine Motor Coordination   Other Fine Motor Exercises Patient seen for coordination exercises with manipulation of [place] like discs for turning with left hand, cues for isolated finger movements to turn object in hand.  Attempts to pick up coins from tabletop, unable to pick up from table but able to slide to the edge and then place into bank.   Neurological Re-education Exercises   Other Exercises 1 Patient seen for AROM strengthening tasks with reaching in multi planes to place objects with left hand.  Resistive pinch pins for lateral and 3 point pinch for red, yellow and green resistance.  Grip strengthening 2nd setting with 11# for sustained grip for 25 reps with cues, left hand.  OT Education - 05/15/15 2046    Education provided Yes   Education Details HEP, strength and coordination   Methods Explanation;Demonstration;Verbal cues   Comprehension Verbal cues required;Returned demonstration;Verbalized understanding             OT Long Term Goals - 05/07/15 1645    OT LONG TERM GOAL #1   Title Patient will improve LUE strength by 1 mm grade to assist with completing self care and IADL tasks with modified independence.   Baseline requiring assist from wife at eval   Time 12   Period Weeks   Status New   OT LONG TERM GOAL #2   Title Patient will improve left grip strength by 10# to open jars and containers.   Baseline difficulty opening items and requires assist.   Time 12    Period Weeks   Status New   OT LONG TERM GOAL #3   Title Patient will improve left hand pinch strength by 3# to be able to hold items bilaterally without dropping.   Baseline difficulty with holding items on left side such as plate   Time 12   Period Weeks   Status New   OT LONG TERM GOAL #4   Title Patient will improve coordination on 9 hole peg test by 3 minutes to assist with completing buttons, snaps and zippers on clothing with modified independence.   Baseline 9 hole peg test took greater than 10 minutes at eval   Time 12   Period Weeks   Status New   OT LONG TERM GOAL #5   Title Patient will complete tub/shower transfers with modified independence.    Baseline supervision   Time 12   Period Weeks   Status New   OT LONG TERM GOAL #7   Title Patient will complete light housekeeping tasks with modified independence.   Baseline requires assist at eval   Time [place] - 05/15/15 2047    Clinical Impression Statement Patient had told previous therapist last session he wasn't sure if he wanted to continue with therapy, this date he reports he wants to continue and work on his left UE to improve the strength and coordination skills and realizes he has difficulty performing tasks.  He is slow to complete coordination tasks and requires cues for in hand manipulation skills and demonstrates decreased translatory skills which affect his daily tasks.  Will continue to work on these areas to improve left hand use to complete ADLs and IADLs.    Rehab Potential Good   OT Frequency 2x / week   OT Duration 12 weeks   OT Treatment/Interventions Self-care/ADL training;Therapeutic exercise;Patient/family education;Neuromuscular education;Balance training;DME and/or AE instruction;Therapeutic activities   Consulted and Agree with Plan of Care Patient;Family member/caregiver   Family Member Consulted wife        Problem List Patient Active  Problem List   Diagnosis Date Noted  . Cerebral vascular accident 04/11/2015  . AF (paroxysmal atrial fibrillation) 04/11/2015  . Episode of syncope 04/11/2015  . Left hip pain 04/03/2015  . Forehead contusion 04/03/2015  . Left elbow contusion 04/03/2015  . Hyperglycemia 04/03/2015  . Facial droop 04/02/2015  . Stroke 04/02/2015  . High cholesterol   . TIA (transient ischemic attack)   . Arthritis   . Migraine   . CLL (chronic lymphocytic leukemia)   . DDD (degenerative disc disease),  lumbar 09/11/2014  . Neuritis or radiculitis due to rupture of lumbar intervertebral disc 09/11/2014  . A-fib 04/08/2014  . Heart valve disease 04/08/2014  . HLD (hyperlipidemia) 04/08/2014  . Carpal tunnel syndrome 01/23/2014  . DDD (degenerative disc disease), cervical 01/23/2014  . Cervical spinal stenosis 01/23/2014  . Degenerative arthritis of lumbar spine 01/23/2014  . Nerve root pain 01/23/2014   Amy Oneita Jolly, OTR/L, CLT Lovett,Amy 05/16/2015, 9:14 AM  [place] MAIN [facility] SERVICES [address] [place], [place], [address] Phone: [phone]   Fax:  [phone]

## 2015-05-20 ENCOUNTER — Ambulatory Visit: Payer: Medicare PPO | Admitting: Occupational Therapy

## 2015-05-20 ENCOUNTER — Encounter: Payer: Self-pay | Admitting: Physical Therapy

## 2015-05-20 ENCOUNTER — Encounter: Payer: Medicare PPO | Admitting: Speech Pathology

## 2015-05-20 ENCOUNTER — Ambulatory Visit: Payer: Medicare PPO | Admitting: Physical Therapy

## 2015-05-20 ENCOUNTER — Encounter: Payer: Self-pay | Admitting: Occupational Therapy

## 2015-05-20 DIAGNOSIS — M6281 Muscle weakness (generalized): Secondary | ICD-10-CM | POA: Diagnosis not present

## 2015-05-20 DIAGNOSIS — IMO0002 Reserved for concepts with insufficient information to code with codable children: Secondary | ICD-10-CM

## 2015-05-20 DIAGNOSIS — R531 Weakness: Secondary | ICD-10-CM

## 2015-05-20 DIAGNOSIS — G8194 Hemiplegia, unspecified affecting left nondominant side: Secondary | ICD-10-CM

## 2015-05-20 NOTE — Therapy (Signed)
[place] MAIN [facility] SERVICES [address] [place], [place], [address] Phone: [phone]   Fax:  [phone]  Occupational Therapy Treatment/Discharge Summary  Patient Details  Name: Nicholas Elliott MRN: [id number] Date of Birth: 1922/04/02 Referring Provider:  [name], MD  Encounter Date: 05/20/2015      OT End of Session - 05/20/15 1337    Visit Number 4   Number of Visits 24   Date for OT Re-Evaluation 07/30/15   OT Start Time 1300   OT Stop Time 1326   OT Time Calculation (min) 26 min   Activity Tolerance Patient tolerated treatment well   Behavior During Therapy [facility] for tasks assessed/performed      Past Medical History  Diagnosis Date  . High cholesterol   . TIA (transient ischemic attack)   . Arthritis   . Glaucoma   . Migraine   . CLL (chronic lymphocytic leukemia)   . Facial droop 04/02/2015  . High cholesterol   . TIA (transient ischemic attack)   . Arthritis   . Migraine   . Stroke 04/02/2015  . CLL (chronic lymphocytic leukemia)   . Left hip pain 04/03/2015  . Forehead contusion 04/03/2015  . Left elbow contusion 04/03/2015  . Hyperglycemia 04/03/2015  . A-fib 04/08/2014  . Carpal tunnel syndrome 01/23/2014  . DDD (degenerative disc disease), cervical 01/23/2014  . Cervical spinal stenosis 01/23/2014  . Cerebral vascular accident 04/11/2015  . DDD (degenerative disc disease), lumbar 09/11/2014  . Heart valve disease 04/08/2014  . HLD (hyperlipidemia) 04/08/2014  . Neuritis or radiculitis due to rupture of lumbar intervertebral disc 09/11/2014  . Degenerative arthritis of lumbar spine 01/23/2014  . AF (paroxysmal atrial fibrillation) 04/11/2015  . Nerve root pain 01/23/2014  . Episode of syncope 04/11/2015    Past Surgical History  Procedure Laterality Date  . Upper palate reparin    . Spine surgery      There were no vitals filed for this visit.  Visit Diagnosis:  Muscle weakness  Lack of coordination due to  stroke      Subjective Assessment - 05/20/15 1335    Subjective  Patient reports he is doing much more in the home.    Patient has achieved all Occupational Therapy goals. His elbow strength improved from 4-/5 to 4+/5, lateral pinch improved from 10 lbs to 15 lbs and 3 point pinch from 7 to 15 lbs. Grip from 21 lbs to 35 lbs. Improved with coordination as per 9 hole peg test from 631 seconds to 94 seconds. He now able to enter exit his step in shower using grab bars. Patient now performing light house work. Today completed removing and replacing bolts and nuts and Picked up coins largest to smallest and placed in coin counter.                                 OT Long Term Goals - 05/20/15 1303    OT LONG TERM GOAL #1   Status Achieved   OT LONG TERM GOAL #2   Status Achieved   OT LONG TERM GOAL #3   Status Achieved   OT LONG TERM GOAL #4   Status Achieved   OT LONG TERM GOAL #5   Status Achieved   OT LONG TERM GOAL #7   Status Achieved               Plan -  25-May-2015 1337    Clinical Impression Statement All 5 goals are achieved. He will be evaluated by PT    Pt will benefit from skilled therapeutic intervention in order to improve on the following deficits (Retired) Decreased endurance;Decreased strength;Decreased balance;Decreased mobility;Difficulty walking;Decreased range of motion;Decreased coordination;Impaired UE functional use   OT Treatment/Interventions Self-care/ADL training;Therapeutic exercise;Patient/family education;Neuromuscular education;Balance training;DME and/or AE instruction;Therapeutic activities          G-Codes - 2015-05-25 1317    Functional Assessment Tool Used 9 hole peg test, clinical judgment, ADL assessment, strength testing.   Functional Limitation Carrying, moving and handling objects   Carrying, Moving and Handling Objects Goal Status [phone]) At least 20 percent but less than 40 percent impaired, limited or restricted    Carrying, Moving and Handling Objects Discharge Status [id number]) At least 20 percent but less than 40 percent impaired, limited or restricted      Problem List Patient Active Problem List   Diagnosis Date Noted  . Cerebral vascular accident 04/11/2015  . AF (paroxysmal atrial fibrillation) 04/11/2015  . Episode of syncope 04/11/2015  . Left hip pain 04/03/2015  . Forehead contusion 04/03/2015  . Left elbow contusion 04/03/2015  . Hyperglycemia 04/03/2015  . Facial droop 04/02/2015  . Stroke 04/02/2015  . High cholesterol   . TIA (transient ischemic attack)   . Arthritis   . Migraine   . CLL (chronic lymphocytic leukemia)   . DDD (degenerative disc disease), lumbar 09/11/2014  . Neuritis or radiculitis due to rupture of lumbar intervertebral disc 09/11/2014  . A-fib 04/08/2014  . Heart valve disease 04/08/2014  . HLD (hyperlipidemia) 04/08/2014  . Carpal tunnel syndrome 01/23/2014  . DDD (degenerative disc disease), cervical 01/23/2014  . Cervical spinal stenosis 01/23/2014  . Degenerative arthritis of lumbar spine 01/23/2014  . Nerve root pain 01/23/2014    Myrene Galas, MS/OTR/L  05-25-15, 1:43 PM  [place] MAIN [facility] SERVICES [address] [place], [place], [address] Phone: [phone]   Fax:  [phone]

## 2015-05-20 NOTE — Therapy (Signed)
[place] MAIN [facility] SERVICES [address] [place], [place], [address] Phone: [phone]   Fax:  [phone]  Physical Therapy Evaluation  Patient Details  Name: Nicholas Elliott MRN: [id number] Date of Birth: 1922/08/12 Referring Provider:  [name], MD  Encounter Date: 05/20/2015      PT End of Session - 05/20/15 1432    Visit Number 1   Number of Visits 17   Date for PT Re-Evaluation 07/15/15   PT Start Time 0245   PT Stop Time 0330   PT Time Calculation (min) 45 min   Behavior During Therapy [facility] for tasks assessed/performed      Past Medical History  Diagnosis Date  . High cholesterol   . TIA (transient ischemic attack)   . Arthritis   . Glaucoma   . Migraine   . CLL (chronic lymphocytic leukemia)   . Facial droop 04/02/2015  . High cholesterol   . TIA (transient ischemic attack)   . Arthritis   . Migraine   . Stroke 04/02/2015  . CLL (chronic lymphocytic leukemia)   . Left hip pain 04/03/2015  . Forehead contusion 04/03/2015  . Left elbow contusion 04/03/2015  . Hyperglycemia 04/03/2015  . A-fib 04/08/2014  . Carpal tunnel syndrome 01/23/2014  . DDD (degenerative disc disease), cervical 01/23/2014  . Cervical spinal stenosis 01/23/2014  . Cerebral vascular accident 04/11/2015  . DDD (degenerative disc disease), lumbar 09/11/2014  . Heart valve disease 04/08/2014  . HLD (hyperlipidemia) 04/08/2014  . Neuritis or radiculitis due to rupture of lumbar intervertebral disc 09/11/2014  . Degenerative arthritis of lumbar spine 01/23/2014  . AF (paroxysmal atrial fibrillation) 04/11/2015  . Nerve root pain 01/23/2014  . Episode of syncope 04/11/2015    Past Surgical History  Procedure Laterality Date  . Upper palate reparin    . Spine surgery      There were no vitals filed for this visit.  Visit Diagnosis:  Weakness generalized  Muscle weakness  Lack of coordination due to stroke  Hemiplegia affecting left nondominant  side      Subjective Assessment - 05/20/15 1352    Subjective Patient says that he has passed all his OT and ST. He says that he is going to help mow the lawn on a riding mower.    Currently in Pain? No/denies            [facility] PT Assessment - 05/20/15 1357    Assessment   Medical Diagnosis CVA   Onset Date/Surgical Date 04/02/15   Hand Dominance Right   Prior Therapy --  OT and ST   Precautions   Precautions None   Restrictions   Weight Bearing Restrictions No   Balance Screen   Has the patient fallen in the past 6 months Yes   How many times? 1   Has the patient had a decrease in activity level because of a fear of falling?  Yes   Is the patient reluctant to leave their home because of a fear of falling?  No   Home Environment   Living Environment Private residence   Living Arrangements Spouse/significant other   Available Help at Discharge Family   Type of [place] None   Prior Function   Level of [place] Retired       out come measures:  5 x sit to stand 20.53  sec        10 MW 1.34 m/sec     TUG n 12.71 sec  6 MW    1015 feet Gait is without AD and no deviation or sway Transfers sit to stand independently Bed mobility; slow but independent  Standing balance dynamic:good with unable to perform single leg stand > 2 seconds, unable to get in and out of tandem stand.  Standing balance static:  Strength LLE hip 4/5 except  Hip ext and hip add 3/5,    Knee 5/5    Ankle 4/5 Strength RLE 4/5  Hip , knee and ankle 4/5 ROM : WFL                             PT Long Term Goals - 05/20/15 1445    PT LONG TERM GOAL #1   Title Patient will tolerate 5 seconds of single leg stance without loss of balance to improve ability to get in and out of shower safely   PT LONG TERM GOAL #2   Title Patient will be independent in home exercise program to  improve strength/mobility for better functional independence with  ADLs   PT LONG TERM GOAL #3   Title Patient will reduce timed up and go to <11 seconds to reduce fall risk and demonstrate improved transfer/gait ability.               Plan - 05/20/15 1435    Clinical Impression Statement Patient is 81 yr old male with decreased dynamic standing balance and mild LLE hip add and hip extension. Patient has slow mobility with supine to prone. Patient has decreased dynamic standing balance with decreased single leg stand and  tandem stand.    Rehab Potential Good   PT Frequency 2x / week   PT Duration 8 weeks   Consulted and Agree with Plan of Care Patient         Problem List Patient Active Problem List   Diagnosis Date Noted  . Cerebral vascular accident 04/11/2015  . AF (paroxysmal atrial fibrillation) 04/11/2015  . Episode of syncope 04/11/2015  . Left hip pain 04/03/2015  . Forehead contusion 04/03/2015  . Left elbow contusion 04/03/2015  . Hyperglycemia 04/03/2015  . Facial droop 04/02/2015  . Stroke 04/02/2015  . High cholesterol   . TIA (transient ischemic attack)   . Arthritis   . Migraine   . CLL (chronic lymphocytic leukemia)   . DDD (degenerative disc disease), lumbar 09/11/2014  . Neuritis or radiculitis due to rupture of lumbar intervertebral disc 09/11/2014  . A-fib 04/08/2014  . Heart valve disease 04/08/2014  . HLD (hyperlipidemia) 04/08/2014  . Carpal tunnel syndrome 01/23/2014  . DDD (degenerative disc disease), cervical 01/23/2014  . Cervical spinal stenosis 01/23/2014  . Degenerative arthritis of lumbar spine 01/23/2014  . Nerve root pain 01/23/2014    [name] 05/20/2015, 3:12 PM  [place] MAIN [facility] SERVICES [address] [place], [place], [address] Phone: [phone]   Fax:  [phone]

## 2015-05-22 ENCOUNTER — Encounter: Payer: Medicare PPO | Admitting: Occupational Therapy

## 2015-05-22 ENCOUNTER — Encounter: Payer: Medicare PPO | Admitting: Speech Pathology

## 2015-05-22 ENCOUNTER — Ambulatory Visit: Payer: Medicare PPO | Admitting: Physical Therapy

## 2015-05-27 ENCOUNTER — Encounter: Payer: Medicare PPO | Admitting: Occupational Therapy

## 2015-05-27 ENCOUNTER — Encounter: Payer: Medicare PPO | Admitting: Speech Pathology

## 2015-05-27 ENCOUNTER — Ambulatory Visit: Payer: Medicare PPO | Attending: Family Medicine | Admitting: Physical Therapy

## 2015-05-27 DIAGNOSIS — R531 Weakness: Secondary | ICD-10-CM

## 2015-05-27 DIAGNOSIS — I6980 Unspecified sequelae of other cerebrovascular disease: Secondary | ICD-10-CM | POA: Insufficient documentation

## 2015-05-27 DIAGNOSIS — G8194 Hemiplegia, unspecified affecting left nondominant side: Secondary | ICD-10-CM | POA: Diagnosis present

## 2015-05-27 DIAGNOSIS — R279 Unspecified lack of coordination: Secondary | ICD-10-CM | POA: Diagnosis present

## 2015-05-27 DIAGNOSIS — M6281 Muscle weakness (generalized): Secondary | ICD-10-CM | POA: Insufficient documentation

## 2015-05-27 DIAGNOSIS — IMO0002 Reserved for concepts with insufficient information to code with codable children: Secondary | ICD-10-CM

## 2015-05-27 NOTE — Therapy (Signed)
[place] MAIN [facility] SERVICES [address] [place], [place], [address] Phone: [phone]   Fax:  [phone]  Physical Therapy Treatment  Patient Details  Name: Nicholas Elliott MRN: [id number] Date of Birth: [birth year] Referring Provider:  [name], MD  Encounter Date: 05/27/2015      PT End of Session - 05/27/15 1403    Visit Number 2   Number of Visits 17   Date for PT Re-Evaluation 07/15/15   PT Start Time 0200   PT Stop Time 0245   PT Time Calculation (min) 45 min   Behavior During Therapy [facility] for tasks assessed/performed      Past Medical History  Diagnosis Date  . High cholesterol   . TIA (transient ischemic attack)   . Arthritis   . Glaucoma   . Migraine   . CLL (chronic lymphocytic leukemia)   . Facial droop 04/02/2015  . High cholesterol   . TIA (transient ischemic attack)   . Arthritis   . Migraine   . Stroke 04/02/2015  . CLL (chronic lymphocytic leukemia)   . Left hip pain 04/03/2015  . Forehead contusion 04/03/2015  . Left elbow contusion 04/03/2015  . Hyperglycemia 04/03/2015  . A-fib 04/08/2014  . Carpal tunnel syndrome 01/23/2014  . DDD (degenerative disc disease), cervical 01/23/2014  . Cervical spinal stenosis 01/23/2014  . Cerebral vascular accident 04/11/2015  . DDD (degenerative disc disease), lumbar 09/11/2014  . Heart valve disease 04/08/2014  . HLD (hyperlipidemia) 04/08/2014  . Neuritis or radiculitis due to rupture of lumbar intervertebral disc 09/11/2014  . Degenerative arthritis of lumbar spine 01/23/2014  . AF (paroxysmal atrial fibrillation) 04/11/2015  . Nerve root pain 01/23/2014  . Episode of syncope 04/11/2015    Past Surgical History  Procedure Laterality Date  . Upper palate reparin    . Spine surgery      There were no vitals filed for this visit.  Visit Diagnosis:  Weakness generalized  Muscle weakness  Lack of coordination due to stroke  Hemiplegia affecting left nondominant  side      Subjective Assessment - 05/27/15 1401    Subjective Patient says that he was sore after his PT exam .    Pain Score 0-No pain       Neuromuscular training: Tandem stand , modified tandem stand with head turn, modified tandem stand with ball toss Side stepping with head control Step ups to stool, step ups from blue foam tapping to stool Weight shift and step backwards and weight shift step fwd x 20  Tandem walking fwd, side stepping  Side stepping on blue foam beam x 5 Leg press with 90 lbs and heel raises 90 lbs x 20 x 3  Patient has difficulty with large steps backwards and with weight shift backwards.  Tilt board fwd/backwards and side to side                          PT Education - 05/27/15 1402    Education provided Yes   Education Details HEP [email or web address]   Person(s) Educated Patient   Methods Explanation   Comprehension Verbalized understanding             PT Long Term Goals - 05/20/15 1445    PT LONG TERM GOAL #1   Title Patient will tolerate 5 seconds of single leg stance without loss of balance to improve ability to get in and out of shower safely  PT LONG TERM GOAL #2   Title Patient will be independent in home exercise program to improve strength/mobility for better functional independence with  ADLs   PT LONG TERM GOAL #3   Title Patient will reduce timed up and go to <11 seconds to reduce fall risk and demonstrate improved transfer/gait ability.               Plan - 05/27/15 1404    Clinical Impression Statement Patient has dynamic standing balance deficits and unsteady gait and responds well to neuromuscular training and strengthening of BLE.    Pt will benefit from skilled therapeutic intervention in order to improve on the following deficits Decreased strength;Decreased balance;Abnormal gait;Difficulty walking   Rehab Potential Good   PT Frequency 2x / week   PT Duration 8 weeks   Consulted and Agree with  Plan of Care Patient        Problem List Patient Active Problem List   Diagnosis Date Noted  . Cerebral vascular accident 04/11/2015  . AF (paroxysmal atrial fibrillation) 04/11/2015  . Episode of syncope 04/11/2015  . Left hip pain 04/03/2015  . Forehead contusion 04/03/2015  . Left elbow contusion 04/03/2015  . Hyperglycemia 04/03/2015  . Facial droop 04/02/2015  . Stroke 04/02/2015  . High cholesterol   . TIA (transient ischemic attack)   . Arthritis   . Migraine   . CLL (chronic lymphocytic leukemia)   . DDD (degenerative disc disease), lumbar 09/11/2014  . Neuritis or radiculitis due to rupture of lumbar intervertebral disc 09/11/2014  . A-fib 04/08/2014  . Heart valve disease 04/08/2014  . HLD (hyperlipidemia) 04/08/2014  . Carpal tunnel syndrome 01/23/2014  . DDD (degenerative disc disease), cervical 01/23/2014  . Cervical spinal stenosis 01/23/2014  . Degenerative arthritis of lumbar spine 01/23/2014  . Nerve root pain 01/23/2014    [name] 05/27/2015, 2:20 PM  [place] MAIN [facility] SERVICES [address] [place], [place], [address] Phone: [phone]   Fax:  [phone]

## 2015-05-29 ENCOUNTER — Ambulatory Visit (INDEPENDENT_AMBULATORY_CARE_PROVIDER_SITE_OTHER): Payer: Medicare PPO | Admitting: Urology

## 2015-05-29 ENCOUNTER — Ambulatory Visit: Payer: Medicare PPO | Admitting: Physical Therapy

## 2015-05-29 ENCOUNTER — Encounter: Payer: Medicare PPO | Admitting: Speech Pathology

## 2015-05-29 ENCOUNTER — Encounter: Payer: Medicare PPO | Admitting: Occupational Therapy

## 2015-05-29 ENCOUNTER — Encounter: Payer: Self-pay | Admitting: Urology

## 2015-05-29 VITALS — HR 74 | Ht 72.0 in | Wt 163.3 lb

## 2015-05-29 DIAGNOSIS — N472 Paraphimosis: Secondary | ICD-10-CM

## 2015-05-29 NOTE — Progress Notes (Signed)
05/29/2015 10:53 AM   Nicholas Elliott 11-03-1921 [id number]  Referring provider: [name], MD [address] [place], [place] [address]  Chief Complaint  Patient presents with  . penile cyst/mass    x 3-4 weeks     HPI: Patient's had pain in the penis for about 3 weeks. In recent heart attack and and the patient states almost cropped from S. Is happy now to be feeling well. No stents placed and placed on anticoagulants. Paraphimosis was easily reduced. C note for further information    PMH: Past Medical History  Diagnosis Date  . High cholesterol   . TIA (transient ischemic attack)   . Arthritis   . Glaucoma   . Migraine   . CLL (chronic lymphocytic leukemia)   . Facial droop 04/02/2015  . High cholesterol   . TIA (transient ischemic attack)   . Arthritis   . Migraine   . Stroke 04/02/2015  . CLL (chronic lymphocytic leukemia)   . Left hip pain 04/03/2015  . Forehead contusion 04/03/2015  . Left elbow contusion 04/03/2015  . Hyperglycemia 04/03/2015  . A-fib 04/08/2014  . Carpal tunnel syndrome 01/23/2014  . DDD (degenerative disc disease), cervical 01/23/2014  . Cervical spinal stenosis 01/23/2014  . Cerebral vascular accident 04/11/2015  . DDD (degenerative disc disease), lumbar 09/11/2014  . Heart valve disease 04/08/2014  . HLD (hyperlipidemia) 04/08/2014  . Neuritis or radiculitis due to rupture of lumbar intervertebral disc 09/11/2014  . Degenerative arthritis of lumbar spine 01/23/2014  . AF (paroxysmal atrial fibrillation) 04/11/2015  . Nerve root pain 01/23/2014  . Episode of syncope 04/11/2015    Surgical History: Past Surgical History  Procedure Laterality Date  . Upper palate reparin    . Spine surgery      Home Medications:    Medication List       This list is accurate as of: 05/29/15 10:53 AM.  Always use your most recent med list.               aspirin EC 81 MG tablet  Take 81 mg by mouth daily.     CENTRUM SILVER ADULT 50+ PO  Take by mouth  daily.     clopidogrel 75 MG tablet  Commonly known as:  PLAVIX  Take 75 mg by mouth at bedtime.     CRESTOR 20 MG tablet  Generic drug:  rosuvastatin  Take 20 mg by mouth at bedtime.     cyanocobalamin 100 MCG tablet  Take 1 tablet (100 mcg total) by mouth daily.     digoxin 0.125 MG tablet  Commonly known as:  LANOXIN  Take 0.125 mg by mouth daily.     furosemide 20 MG tablet  Commonly known as:  LASIX     gabapentin 400 MG capsule  Commonly known as:  NEURONTIN     HYDROcodone-acetaminophen 5-325 MG per tablet  Commonly known as:  NORCO/VICODIN  Take 1 tablet by mouth every 6 (six) hours as needed for moderate pain.     OSTEO BI-FLEX JOINT SHIELD Tabs  Take 1 tablet by mouth 2 (two) times daily.     PHAZYME 125 MG chewable tablet  Generic drug:  simethicone  Chew 125 mg by mouth 2 (two) times daily.     PROAIR HFA 108 (90 BASE) MCG/ACT inhaler  Generic drug:  albuterol  Inhale into the lungs every 6 (six) hours as needed for wheezing or shortness of breath.     RESOURCE THICKENUP  CLEAR Powd  As needed     traMADol 50 MG tablet  Commonly known as:  ULTRAM        Allergies: No Known Allergies  Family History: Family History  Problem Relation Age of Onset  . Stroke Father   . Cancer Sister   . Heart attack Brother   . Osteoarthritis Mother   . Prostate cancer Neg Hx     Social History:  reports that he has never smoked. He does not have any smokeless tobacco history on file. He reports that he does not drink alcohol or use illicit drugs.  ROS: UROLOGY Frequent Urination?: No Hard to postpone urination?: No Burning/pain with urination?: No Get up at night to urinate?: No Leakage of urine?: No Urine stream starts and stops?: No Trouble starting stream?: No Do you have to strain to urinate?: No Blood in urine?: No Urinary tract infection?: No Sexually transmitted disease?: No Injury to kidneys or bladder?: No Painful intercourse?: No Weak  stream?: No Erection problems?: No Penile pain?: No  Gastrointestinal Nausea?: No Vomiting?: No Indigestion/heartburn?: No Diarrhea?: No Constipation?: No  Constitutional Fever: No Night sweats?: No Weight loss?: No Fatigue?: Yes  Skin Skin rash/lesions?: No Itching?: No  Eyes Blurred vision?: Yes Double vision?: Yes  Ears/Nose/Throat Sore throat?: No Sinus problems?: No  Hematologic/Lymphatic Swollen glands?: No Easy bruising?: No  Cardiovascular Leg swelling?: No Chest pain?: No  Respiratory Cough?: No Shortness of breath?: No  Endocrine Excessive thirst?: No  Musculoskeletal Back pain?: No Joint pain?: Yes  Neurological Headaches?: No Dizziness?: No  Psychologic Depression?: Yes Anxiety?: No  Physical Exam: BP 109/66 mmHg  Pulse 74  Ht 6' (1.829 m)  Wt 163 lb 4.8 oz (74.072 kg)  BMI 22.14 kg/m2  Constitutional:  Alert and oriented, No acute distress. HEENT: [place] AT, moist mucus membranes.  Trachea midline, no masses. Cardiovascular: No clubbing, cyanosis, or edema. Respiratory: Normal respiratory effort, no increased work of breathing. GI: Abdomen is soft, nontender, nondistended, no abdominal masses GU: No CVA tenderness. Paraphimosis reduced. Small prostate normal rectal sphincter for a patient this age. Slightly lax with no hemorrhoids. Prostate showed no masses nodules or asymmetry and was very small. The rectum revealed no masses. Testicles were descended and normal. Skin: No rashes, bruises or suspicious lesions. Lymph: No cervical or inguinal adenopathy. Neurologic: Grossly intact, no focal deficits, moving all 4 extremities. Psychiatric: Normal mood and affect.  Laboratory Data: Lab Results  Component Value Date   WBC 33.7* 04/02/2015   HGB 16.3 04/02/2015   HCT 48.0 04/02/2015   MCV 98.0 04/02/2015   PLT 126* 04/02/2015    Lab Results  Component Value Date   CREATININE 0.76 04/07/2015    No results found for: PSA  No  results found for: TESTOSTERONE  Lab Results  Component Value Date   HGBA1C 6.1* 04/03/2015    Urinalysis    Component Value Date/Time   COLORURINE YELLOW 04/02/2015 2020   COLORURINE Yellow 11/22/2013 1941   APPEARANCEUR CLEAR 04/02/2015 2020   APPEARANCEUR Hazy 11/22/2013 1941   LABSPEC 1.023 04/02/2015 2020   LABSPEC 1.021 11/22/2013 1941   PHURINE 5.5 04/02/2015 2020   PHURINE 5.0 11/22/2013 1941   GLUCOSEU 250* 04/02/2015 2020   GLUCOSEU Negative 11/22/2013 1941   HGBUR SMALL* 04/02/2015 2020   HGBUR Negative 11/22/2013 1941   BILIRUBINUR NEGATIVE 04/02/2015 2020   BILIRUBINUR Negative 11/22/2013 1941   KETONESUR 15* 04/02/2015 2020   KETONESUR Trace 11/22/2013 1941   PROTEINUR NEGATIVE 04/02/2015  2020   PROTEINUR 30 mg/dL 11/22/2013 1941   UROBILINOGEN 0.2 04/02/2015 2020   NITRITE NEGATIVE 04/02/2015 2020   NITRITE Negative 11/22/2013 1941   LEUKOCYTESUR NEGATIVE 04/02/2015 2020   LEUKOCYTESUR Negative 11/22/2013 1941    Pertinent Imaging: None   Assessment & Plan:  paraphimosis that I reduced. Present for 3 weeks. Patient may eventually benefit from a circumcision but recent cardiac event and use of anticoagulants in the form of [place] gel would be factors not to conducive to a immediate circumcision in the near in the near future. Patient's 81 years old. I advised him to make sure he pulls his foreskin down after he retracts it. I was able to do the reduction of the paraphimosis relatively easily today O patient experienced some discomfort from the reduction of the three-week retracted foreskin. His pain was gone after the reduction of the paraphimosis  There are no diagnoses linked to this encounter.  No Follow-up on file.  Collier Flowers, [place] [address] [place], [place] [address] [phone]

## 2015-06-04 ENCOUNTER — Ambulatory Visit: Payer: Medicare PPO

## 2015-06-04 DIAGNOSIS — M6281 Muscle weakness (generalized): Secondary | ICD-10-CM

## 2015-06-04 DIAGNOSIS — IMO0002 Reserved for concepts with insufficient information to code with codable children: Secondary | ICD-10-CM

## 2015-06-04 DIAGNOSIS — R531 Weakness: Secondary | ICD-10-CM | POA: Diagnosis not present

## 2015-06-04 NOTE — Therapy (Cosign Needed Addendum)
[place] MAIN [facility] SERVICES [address] [place], [place], [address] Phone: [phone]   Fax:  [phone]  Physical Therapy Treatment  Patient Details  Name: Nicholas Elliott MRN: [id number] Date of Birth: 25-Jun-1922 Referring Provider:  [name], MD  Encounter Date: 06/04/2015      PT End of Session - 06/09/15 1205    Visit Number 3   Number of Visits 17   Date for PT Re-Evaluation 07/15/15   PT Start Time 0930   PT Stop Time 1015   PT Time Calculation (min) 45 min   Equipment Utilized During Treatment Gait belt   Behavior During Therapy [facility] for tasks assessed/performed      Past Medical History  Diagnosis Date  . High cholesterol   . TIA (transient ischemic attack)   . Arthritis   . Glaucoma   . Migraine   . CLL (chronic lymphocytic leukemia)   . Facial droop 04/02/2015  . High cholesterol   . TIA (transient ischemic attack)   . Arthritis   . Migraine   . Stroke 04/02/2015  . CLL (chronic lymphocytic leukemia)   . Left hip pain 04/03/2015  . Forehead contusion 04/03/2015  . Left elbow contusion 04/03/2015  . Hyperglycemia 04/03/2015  . A-fib 04/08/2014  . Carpal tunnel syndrome 01/23/2014  . DDD (degenerative disc disease), cervical 01/23/2014  . Cervical spinal stenosis 01/23/2014  . Cerebral vascular accident 04/11/2015  . DDD (degenerative disc disease), lumbar 09/11/2014  . Heart valve disease 04/08/2014  . HLD (hyperlipidemia) 04/08/2014  . Neuritis or radiculitis due to rupture of lumbar intervertebral disc 09/11/2014  . Degenerative arthritis of lumbar spine 01/23/2014  . AF (paroxysmal atrial fibrillation) 04/11/2015  . Nerve root pain 01/23/2014  . Episode of syncope 04/11/2015    Past Surgical History  Procedure Laterality Date  . Upper palate reparin    . Spine surgery      There were no vitals filed for this visit.  Visit Diagnosis:  Muscle weakness  Lack of coordination due to stroke      Subjective  Assessment - 06/09/15 1202    Subjective pt reports he continues to have back pain is going to see his MD today but denies any currently.  pt is excited for todays session    Currently in Pain? No/denies   Pain Score 0-No pain      Neuromuscular training: Tandem stand with head turns x 2 minutes Side stepping with head control Stepping onto AIREX, then on to 4 inch step followed by stepping down onto AIREX and then level surface in //bars x15.  Pt required occasional UE assist, and performance improved with each repetition  Stepping over and back x10 bilaterally  Stepping over and back x`10 bilaterally with verbal cueing to perform exercise as fast as possible Side step and back x10 bilaterally Side step and back x10 bilaterally with verbal cueing to perform exercise as fast as possible Side stepping on blue foam beam x 5  There ex: Leg press with 90#x 10, followed by 100# x10 and then 120# x10  sit to stand 2x10  Mini squats in //bars x10                         PT Education - 06/09/15 1204    Education provided Yes   Education Details plan of care and instructed to inform his MD today about his low back and urinary symptoms  Person(s) Educated Patient   Methods Explanation   Comprehension Verbalized understanding             PT Long Term Goals - 05/20/15 1445    PT LONG TERM GOAL #1   Title Patient will tolerate 5 seconds of single leg stance without loss of balance to improve ability to get in and out of shower safely   PT LONG TERM GOAL #2   Title Patient will be independent in home exercise program to improve strength/mobility for better functional independence with  ADLs   PT LONG TERM GOAL #3   Title Patient will reduce timed up and go to <11 seconds to reduce fall risk and demonstrate improved transfer/gait ability.               Plan - 06/09/15 1206    Clinical Impression Statement Pt was able to complete session with minimal rest  breaks and min assist during balance activities.  Pt was advised to inform his MD on his appointment today about his decreased control of his bladder since the start of his back pain.  Pt experienced minimal increase in back pain throughout session   Pt will benefit from skilled therapeutic intervention in order to improve on the following deficits Decreased strength;Decreased balance;Abnormal gait;Difficulty walking   Rehab Potential Good   PT Frequency 2x / week   PT Duration 8 weeks   PT Next Visit Plan progress HEP   Consulted and Agree with Plan of Care Patient        Problem List Patient Active Problem List   Diagnosis Date Noted  . Cerebral vascular accident 04/11/2015  . AF (paroxysmal atrial fibrillation) 04/11/2015  . Episode of syncope 04/11/2015  . Left hip pain 04/03/2015  . Forehead contusion 04/03/2015  . Left elbow contusion 04/03/2015  . Hyperglycemia 04/03/2015  . Facial droop 04/02/2015  . Stroke 04/02/2015  . High cholesterol   . TIA (transient ischemic attack)   . Arthritis   . Migraine   . CLL (chronic lymphocytic leukemia)   . DDD (degenerative disc disease), lumbar 09/11/2014  . Neuritis or radiculitis due to rupture of lumbar intervertebral disc 09/11/2014  . A-fib 04/08/2014  . Heart valve disease 04/08/2014  . HLD (hyperlipidemia) 04/08/2014  . Carpal tunnel syndrome 01/23/2014  . DDD (degenerative disc disease), cervical 01/23/2014  . Cervical spinal stenosis 01/23/2014  . Degenerative arthritis of lumbar spine 01/23/2014  . Nerve root pain 01/23/2014   Renford Dills, SPT This entire session was performed under direct supervision and direction of a licensed therapist/therapist assistant . I have personally read, edited and approve of the note as written. Gorden Harms. Dejanee Thibeaux, PT, DPT [phone]  Aviyana Sonntag 06/09/2015, 2:23 PM  [place] MAIN [facility] SERVICES [address] [place], [place], [address] Phone:  [phone]   Fax:  [phone]

## 2015-06-09 ENCOUNTER — Other Ambulatory Visit: Payer: Medicare PPO

## 2015-06-10 ENCOUNTER — Encounter: Payer: Self-pay | Admitting: Physical Therapy

## 2015-06-10 ENCOUNTER — Ambulatory Visit: Payer: Medicare PPO | Admitting: Physical Therapy

## 2015-06-10 DIAGNOSIS — R531 Weakness: Secondary | ICD-10-CM

## 2015-06-10 DIAGNOSIS — G8194 Hemiplegia, unspecified affecting left nondominant side: Secondary | ICD-10-CM

## 2015-06-10 DIAGNOSIS — IMO0002 Reserved for concepts with insufficient information to code with codable children: Secondary | ICD-10-CM

## 2015-06-10 DIAGNOSIS — M6281 Muscle weakness (generalized): Secondary | ICD-10-CM

## 2015-06-10 NOTE — Therapy (Signed)
[place] MAIN [facility] SERVICES [address] [place], [place], [address] Phone: [phone]   Fax:  [phone]  Physical Therapy Treatment  Patient Details  Name: Nicholas Elliott MRN: [id number] Date of Birth: 1922/01/09 Referring Provider:  [name], MD  Encounter Date: 06/10/2015      PT End of Session - 06/10/15 1112    Visit Number 4   Number of Visits 17   Date for PT Re-Evaluation 07/15/15   PT Start Time 1100   PT Stop Time 1145   PT Time Calculation (min) 45 min   Equipment Utilized During Treatment Gait belt   Behavior During Therapy [facility] for tasks assessed/performed      Past Medical History  Diagnosis Date  . High cholesterol   . TIA (transient ischemic attack)   . Arthritis   . Glaucoma   . Migraine   . CLL (chronic lymphocytic leukemia)   . Facial droop 04/02/2015  . High cholesterol   . TIA (transient ischemic attack)   . Arthritis   . Migraine   . Stroke 04/02/2015  . CLL (chronic lymphocytic leukemia)   . Left hip pain 04/03/2015  . Forehead contusion 04/03/2015  . Left elbow contusion 04/03/2015  . Hyperglycemia 04/03/2015  . A-fib 04/08/2014  . Carpal tunnel syndrome 01/23/2014  . DDD (degenerative disc disease), cervical 01/23/2014  . Cervical spinal stenosis 01/23/2014  . Cerebral vascular accident 04/11/2015  . DDD (degenerative disc disease), lumbar 09/11/2014  . Heart valve disease 04/08/2014  . HLD (hyperlipidemia) 04/08/2014  . Neuritis or radiculitis due to rupture of lumbar intervertebral disc 09/11/2014  . Degenerative arthritis of lumbar spine 01/23/2014  . AF (paroxysmal atrial fibrillation) 04/11/2015  . Nerve root pain 01/23/2014  . Episode of syncope 04/11/2015    Past Surgical History  Procedure Laterality Date  . Upper palate reparin    . Spine surgery      There were no vitals filed for this visit.  Visit Diagnosis:  Muscle weakness  Lack of coordination due to stroke  Weakness  generalized  Hemiplegia affecting left nondominant side      Subjective Assessment - 06/10/15 1111    Subjective Patient says that he is not having any back pain.    Currently in Pain? No/denies        Gait training with TM at . 7 m/sec x 10 minutes with instruction to take long steps and keep upright posture  Balance training with step fwd/ bwd instruction to take a long step fwd and a bigger step backwards Tandem standing on floor head turns Blue foam and head turns with loss of balance Blue foam single leg stand with leg touching opposite leg for stability Tilt board fwd/bwd and side to side instruction tall posture and head position encouraging safe step up and down off the tilt board 1/2 foam feet apart curve side down without UE support encouraging upright posture 1/2 foam feet apart curve side down arms extended holding ball  Tandem stand , modified tandem stand with head turn, modified tandem stand with ball toss                          PT Education - 06/10/15 1112    Education provided Yes   Education Details HEP   Person(s) Educated Patient   Methods Explanation   Comprehension Verbalized understanding             PT  Long Term Goals - 05/20/15 1445    PT LONG TERM GOAL #1   Title Patient will tolerate 5 seconds of single leg stance without loss of balance to improve ability to get in and out of shower safely   PT LONG TERM GOAL #2   Title Patient will be independent in home exercise program to improve strength/mobility for better functional independence with  ADLs   PT LONG TERM GOAL #3   Title Patient will reduce timed up and go to <11 seconds to reduce fall risk and demonstrate improved transfer/gait ability.               Plan - 06/10/15 1113    Clinical Impression Statement SBA used throughout with increased in postural sway and LOB most seen with weight shifting activities. CGA needed for head turns and trunk rotation to  maintain balance and upright posture., UE support and CGA needed throughout Airex step up task   Pt will benefit from skilled therapeutic intervention in order to improve on the following deficits Decreased strength;Decreased balance;Abnormal gait;Difficulty walking   Rehab Potential Good   PT Frequency 2x / week   PT Duration 8 weeks   PT Next Visit Plan progress HEP   Consulted and Agree with Plan of Care Patient        Problem List Patient Active Problem List   Diagnosis Date Noted  . Cerebral vascular accident 04/11/2015  . AF (paroxysmal atrial fibrillation) 04/11/2015  . Episode of syncope 04/11/2015  . Left hip pain 04/03/2015  . Forehead contusion 04/03/2015  . Left elbow contusion 04/03/2015  . Hyperglycemia 04/03/2015  . Facial droop 04/02/2015  . Stroke 04/02/2015  . High cholesterol   . TIA (transient ischemic attack)   . Arthritis   . Migraine   . CLL (chronic lymphocytic leukemia)   . DDD (degenerative disc disease), lumbar 09/11/2014  . Neuritis or radiculitis due to rupture of lumbar intervertebral disc 09/11/2014  . A-fib 04/08/2014  . Heart valve disease 04/08/2014  . HLD (hyperlipidemia) 04/08/2014  . Carpal tunnel syndrome 01/23/2014  . DDD (degenerative disc disease), cervical 01/23/2014  . Cervical spinal stenosis 01/23/2014  . Degenerative arthritis of lumbar spine 01/23/2014  . Nerve root pain 01/23/2014    [name] 06/10/2015, 11:36 AM  [place] MAIN [facility] SERVICES [address] [place], [place], [address] Phone: [phone]   Fax:  [phone]

## 2015-06-11 ENCOUNTER — Inpatient Hospital Stay: Payer: Medicare PPO | Attending: Oncology

## 2015-06-11 DIAGNOSIS — D72829 Elevated white blood cell count, unspecified: Secondary | ICD-10-CM | POA: Diagnosis not present

## 2015-06-11 DIAGNOSIS — C9110 Chronic lymphocytic leukemia of B-cell type not having achieved remission: Secondary | ICD-10-CM

## 2015-06-11 LAB — CBC WITH DIFFERENTIAL/PLATELET
Basophils Absolute: 0.1 10*3/uL (ref 0–0.1)
Basophils Relative: 0 %
EOS ABS: 0.3 10*3/uL (ref 0–0.7)
HCT: 40.4 % (ref 40.0–52.0)
Hemoglobin: 13.2 g/dL (ref 13.0–18.0)
LYMPHS ABS: 39.9 10*3/uL — AB (ref 1.0–3.6)
Lymphocytes Relative: 87 %
MCH: 31.9 pg (ref 26.0–34.0)
MCHC: 32.7 g/dL (ref 32.0–36.0)
MCV: 97.6 fL (ref 80.0–100.0)
Monocytes Absolute: 1.1 10*3/uL — ABNORMAL HIGH (ref 0.2–1.0)
Neutro Abs: 4.6 10*3/uL (ref 1.4–6.5)
PLATELETS: 145 10*3/uL — AB (ref 150–440)
RBC: 4.14 MIL/uL — AB (ref 4.40–5.90)
RDW: 13.7 % (ref 11.5–14.5)
WBC: 45.9 10*3/uL — AB (ref 3.8–10.6)

## 2015-06-12 ENCOUNTER — Encounter: Payer: Self-pay | Admitting: Physical Therapy

## 2015-06-12 ENCOUNTER — Ambulatory Visit: Payer: Medicare PPO | Admitting: Physical Therapy

## 2015-06-12 ENCOUNTER — Telehealth: Payer: Self-pay | Admitting: Urology

## 2015-06-12 DIAGNOSIS — G8194 Hemiplegia, unspecified affecting left nondominant side: Secondary | ICD-10-CM

## 2015-06-12 DIAGNOSIS — M6281 Muscle weakness (generalized): Secondary | ICD-10-CM

## 2015-06-12 DIAGNOSIS — R531 Weakness: Secondary | ICD-10-CM

## 2015-06-12 DIAGNOSIS — IMO0002 Reserved for concepts with insufficient information to code with codable children: Secondary | ICD-10-CM

## 2015-06-12 NOTE — Telephone Encounter (Signed)
Pt stopped in and said he was supposed to have surgery.  I didn't see anything in his chart about it.  You will have a hard time understanding him.  Phone# is [phone]

## 2015-06-12 NOTE — Patient Instructions (Signed)
Neuromuscular training: Tandem stand with head turns x 2 minutes Side stepping with head control Stepping onto AIREX, then on to 4 inch step followed by stepping down onto AIREX and then level surface in //bars x15. Pt required occasional UE assist, and performance improved with each repetition  Stepping over and back x10 bilaterally  Stepping over and back x`10 bilaterally with verbal cueing to perform exercise as fast as possible Side step and back x10 bilaterally Side step and back x10 bilaterally with verbal cueing to perform exercise as fast as possible Side stepping on blue foam beam x 5  There ex: Leg press with 90#x 10, followed by 100# x10 and then 120# x10  sit to stand 2x10  Mini squats in //bars x10

## 2015-06-12 NOTE — Therapy (Signed)
[place] [facility] SERVICES [address] [place], [place], [address] Phone: [phone]   Fax:  [phone]  Physical Therapy Treatment  Patient Details  Name: Nicholas Elliott MRN: [id number] Date of Birth: 12-16-1921 Referring Provider:  [name], MD  Encounter Date: 06/12/2015      PT End of Session - 06/12/15 1426    Visit Number 5   Number of Visits 17   Date for PT Re-Evaluation 07/15/15   PT Start Time 0230   PT Stop Time 0315   PT Time Calculation (min) 45 min   Equipment Utilized During Treatment Gait belt   Behavior During Therapy [facility] for tasks assessed/performed      Past Medical History  Diagnosis Date  . High cholesterol   . TIA (transient ischemic attack)   . Arthritis   . Glaucoma   . Migraine   . CLL (chronic lymphocytic leukemia)   . Facial droop 04/02/2015  . High cholesterol   . TIA (transient ischemic attack)   . Arthritis   . Migraine   . Stroke 04/02/2015  . CLL (chronic lymphocytic leukemia)   . Left hip pain 04/03/2015  . Forehead contusion 04/03/2015  . Left elbow contusion 04/03/2015  . Hyperglycemia 04/03/2015  . A-fib 04/08/2014  . Carpal tunnel syndrome 01/23/2014  . DDD (degenerative disc disease), cervical 01/23/2014  . Cervical spinal stenosis 01/23/2014  . Cerebral vascular accident 04/11/2015  . DDD (degenerative disc disease), lumbar 09/11/2014  . Heart valve disease 04/08/2014  . HLD (hyperlipidemia) 04/08/2014  . Neuritis or radiculitis due to rupture of lumbar intervertebral disc 09/11/2014  . Degenerative arthritis of lumbar spine 01/23/2014  . AF (paroxysmal atrial fibrillation) 04/11/2015  . Nerve root pain 01/23/2014  . Episode of syncope 04/11/2015    Past Surgical History  Procedure Laterality Date  . Upper palate reparin    . Spine surgery      There were no vitals filed for this visit.  Visit Diagnosis:  Muscle weakness  Lack of coordination due to stroke  Weakness  generalized  Hemiplegia affecting left nondominant side      Subjective Assessment - 06/12/15 1425    Subjective Patient says that he is not having any back pain.    Currently in Pain? No/denies      Neuromuscular training: Tandem stand with head turns x 2 minutes Side stepping with head control Stepping onto AIREX, then on to 4 inch step followed by stepping down onto AIREX and then level surface in //bars x15. Pt required occasional UE assist, and performance improved with each repetition  Stepping over and back x10 bilaterally  Stepping over and back x`10 bilaterally with verbal cueing to perform exercise as fast as possible Side step and back x10 bilaterally Side step and back x10 bilaterally with verbal cueing to perform exercise as fast as possible Side stepping on blue foam beam x 5  There ex: Leg press with 90#x 10, followed by 100# x10 and then 120# x10  sit to stand 2x10  Mini squats in //bars x10 CGA and Min to mod verbal cues used throughout with increased in postural sway and LOB most seen with narrow base of support and while on uneven surfaces. Continues to have balance deficits typical with diagnosis. Patient performs intermediate level exercises without pain behaviors and needs verbal cuing for postural alignment and head positioning Tactile cues and assistance needed to keep lower leg and knee in neutral to avoid compensations with  ankle motions.                           PT Education - 06/12/15 1425    Education provided Yes   Education Details HEP and saftey   Person(s) Educated Patient   Methods Explanation   Comprehension Verbalized understanding             PT Long Term Goals - 05/20/15 1445    PT LONG TERM GOAL #1   Title Patient will tolerate 5 seconds of single leg stance without loss of balance to improve ability to get in and out of shower safely   PT LONG TERM GOAL #2   Title Patient will be independent in home  exercise program to improve strength/mobility for better functional independence with  ADLs   PT LONG TERM GOAL #3   Title Patient will reduce timed up and go to <11 seconds to reduce fall risk and demonstrate improved transfer/gait ability.               Plan - 06/12/15 1429    Clinical Impression Statement Tactile cues to keep trunk stable without compensation for seated exercises. MinA to perform shoulder extension through full ROM.Verbal cues needed to keep hips aligned with hip extension and abduction. SBA needed with standing hip flexion.   Pt will benefit from skilled therapeutic intervention in order to improve on the following deficits Decreased strength;Decreased balance;Abnormal gait;Difficulty walking   Rehab Potential Good   PT Frequency 2x / week   PT Duration 8 weeks   PT Next Visit Plan progress HEP   Consulted and Agree with Plan of Care Patient        Problem List Patient Active Problem List   Diagnosis Date Noted  . Cerebral vascular accident 04/11/2015  . AF (paroxysmal atrial fibrillation) 04/11/2015  . Episode of syncope 04/11/2015  . Left hip pain 04/03/2015  . Forehead contusion 04/03/2015  . Left elbow contusion 04/03/2015  . Hyperglycemia 04/03/2015  . Facial droop 04/02/2015  . Stroke 04/02/2015  . High cholesterol   . TIA (transient ischemic attack)   . Arthritis   . Migraine   . CLL (chronic lymphocytic leukemia)   . DDD (degenerative disc disease), lumbar 09/11/2014  . Neuritis or radiculitis due to rupture of lumbar intervertebral disc 09/11/2014  . A-fib 04/08/2014  . Heart valve disease 04/08/2014  . HLD (hyperlipidemia) 04/08/2014  . Carpal tunnel syndrome 01/23/2014  . DDD (degenerative disc disease), cervical 01/23/2014  . Cervical spinal stenosis 01/23/2014  . Degenerative arthritis of lumbar spine 01/23/2014  . Nerve root pain 01/23/2014    [name] 06/12/2015, 2:32 PM  [place] [facility] SERVICES [address] [place], [place], [address] Phone: [phone]   Fax:  [phone]

## 2015-06-13 NOTE — Telephone Encounter (Signed)
Left mess/SW 

## 2015-06-16 ENCOUNTER — Encounter: Payer: Self-pay | Admitting: Neurology

## 2015-06-16 ENCOUNTER — Ambulatory Visit (INDEPENDENT_AMBULATORY_CARE_PROVIDER_SITE_OTHER): Payer: Medicare PPO | Admitting: Neurology

## 2015-06-16 VITALS — BP 119/68 | HR 68 | Ht 73.0 in | Wt 167.4 lb

## 2015-06-16 DIAGNOSIS — I6340 Cerebral infarction due to embolism of unspecified cerebral artery: Secondary | ICD-10-CM

## 2015-06-16 DIAGNOSIS — I639 Cerebral infarction, unspecified: Secondary | ICD-10-CM

## 2015-06-16 NOTE — Patient Instructions (Signed)
I had a long d/w patient and friend Nicholas Elliott about his recent stroke, risk for recurrent stroke/TIAs, personally independently reviewed imaging studies and stroke evaluation results and answered questions.Continue aspirin 81 mg orally every day and clopidogrel 75 mg orally every day  for secondary stroke prevention and maintain strict control of hypertension with blood pressure goal below 130/90, diabetes with hemoglobin A1c goal below 6.5% and lipids with LDL cholesterol goal below 100 mg/dL. I also advised the patient to eat a healthy diet with plenty of whole grains, cereals, fruits and vegetables, exercise regularly and maintain ideal body weight .he was also advised to maintain adequate hydration and continue outpatient physical and occupational therapy. If the patient has continuing bruising or bleeding episodes may discontinue Plavix and change the aspirin dose to 325 mg daily. Followup in the future with me in  6 months or call earlier if necessary. Stroke Prevention Some medical conditions and behaviors are associated with an increased chance of having a stroke. You may prevent a stroke by making healthy choices and managing medical conditions. HOW CAN I REDUCE MY RISK OF HAVING A STROKE?   Stay physically active. Get at least 30 minutes of activity on most or all days.  Do not smoke. It may also be helpful to avoid exposure to secondhand smoke.  Limit alcohol use. Moderate alcohol use is considered to be:  No more than 2 drinks per day for men.  No more than 1 drink per day for nonpregnant women.  Eat healthy foods. This involves:  Eating 5 or more servings of fruits and vegetables a day.  Making dietary changes that address high blood pressure (hypertension), high cholesterol, diabetes, or obesity.  Manage your cholesterol levels.  Making food choices that are high in fiber and low in saturated fat, trans fat, and cholesterol may control cholesterol levels.  Take any prescribed  medicines to control cholesterol as directed by your health care provider.  Manage your diabetes.  Controlling your carbohydrate and sugar intake is recommended to manage diabetes.  Take any prescribed medicines to control diabetes as directed by your health care provider.  Control your hypertension.  Making food choices that are low in salt (sodium), saturated fat, trans fat, and cholesterol is recommended to manage hypertension.  Take any prescribed medicines to control hypertension as directed by your health care provider.  Maintain a healthy weight.  Reducing calorie intake and making food choices that are low in sodium, saturated fat, trans fat, and cholesterol are recommended to manage weight.  Stop drug abuse.  Avoid taking birth control pills.  Talk to your health care provider about the risks of taking birth control pills if you are over 80 years old, smoke, get migraines, or have ever had a blood clot.  Get evaluated for sleep disorders (sleep apnea).  Talk to your health care provider about getting a sleep evaluation if you snore a lot or have excessive sleepiness.  Take medicines only as directed by your health care provider.  For some people, aspirin or blood thinners (anticoagulants) are helpful in reducing the risk of forming abnormal blood clots that can lead to stroke. If you have the irregular heart rhythm of atrial fibrillation, you should be on a blood thinner unless there is a good reason you cannot take them.  Understand all your medicine instructions.  Make sure that other conditions (such as anemia or atherosclerosis) are addressed. SEEK IMMEDIATE MEDICAL CARE IF:   You have sudden weakness or numbness of the  face, arm, or leg, especially on one side of the body.  Your face or eyelid droops to one side.  You have sudden confusion.  You have trouble speaking (aphasia) or understanding.  You have sudden trouble seeing in one or both eyes.  You have  sudden trouble walking.  You have dizziness.  You have a loss of balance or coordination.  You have a sudden, severe headache with no known cause.  You have new chest pain or an irregular heartbeat. Any of these symptoms may represent a serious problem that is an emergency. Do not wait to see if the symptoms will go away. Get medical help at once. Call your local emergency services (911 in [place] Do not drive yourself to the hospital. Document Released: 10/14/2004 Document Revised: 01/21/2014 Document Reviewed: 03/09/2013 [facility] Patient Information 2015 [place], [place]. This information is not intended to replace advice given to you by your health care provider. Make sure you discuss any questions you have with your health care provider.

## 2015-06-16 NOTE — Progress Notes (Signed)
[facility] [address] [place]. [place] [address] [phone]       OFFICE FOLLOW-UP NOTE  Nicholas Elliott Date of Birth:  14-Aug-1922 Medical Record Number:  [id number]   HPI: Mr Nicholas Elliott is seen for the first follow-up office visit after [facility] admission for stroke in July 2016. Nicholas Elliott is a 81 y.o. male with a past medical history significant for hypercholesterolemia, TIA's, migraine,CLL, arthritis, and glaucoma, brought to the ED via EMS for evaluation of dysarthria, left hemiparesis, and left facial weakness.Patient is currently drowsy and able to answer questions rather appropriately, however he doesn't engage in conversation, therefore all clinical information was obtained from patient's [facility]" Per EMS, patient is from home, found by sister in the kitchen on the floor after trying to reach him by phone with no success. Sister last saw patient normal this morning at 9am. He had bruise to left eye, and left wrist, left sided deficit and slurred speech. Wearing c-collar on arrival".  . CT brain performed on admission and showed no acute abnormality.Serologies significant for wbc 33.7, platelets 126, UDS negative. ETOH level pending. On aspirin and plavix. Date last known well: 04/02/15 Time last known well: 9 am tPA Given: no, out of the window MRI scan of the brain showed patchy multifocal right MCA territory infarct with associated petechial hemorrhage. There was small remote age bilateral cerebellar infarcts noted as well. MRA of the brain showed no large vessel occlusion but short segment moderate stenosis of the proximal right M2 branch and focal moderate to severe stenosis of the distal left M1 segment and moderate stenosis of the proximal basilar artery. Transthoracic echo showed normal ejection fraction without cardiac source of embolism. There was severe left ventricular hypertrophy and heavily calcified aortic valve with moderate  left atrial enlargement. LDL cholesterol was 80 mg percent and hemoglobin A1c 6.1. Carotid Doppler showed no significant extracranial stenosis. Telemetry monitoring did not show any atrial fibrillation. Patient was started on aspirin and Plavix due to intracranial atherosclerosis and he is tolerating well with only minor bruising or bleeding. His feels his left hand feeling has come back and is also walking better. He is currently getting outpatient physical and occupational therapy. He lives at home by himself and needs very little help. He has not started driving yet. He did have outpatient Holter monitor which did not show atrial fibrillation. He had a minor episode of headache lasting a few hours yesterday and took some Tylenol which helped. He does not have a headache today. ROS:   14 system review of systems is positive for fatigue, activity change, double vision, shortness of breath, headache and all other systems negative  PMH:  Past Medical History  Diagnosis Date  . High cholesterol   . TIA (transient ischemic attack)   . Arthritis   . Glaucoma   . Migraine   . CLL (chronic lymphocytic leukemia)   . Facial droop 04/02/2015  . High cholesterol   . TIA (transient ischemic attack)   . Arthritis   . Migraine   . Stroke 04/02/2015  . CLL (chronic lymphocytic leukemia)   . Left hip pain 04/03/2015  . Forehead contusion 04/03/2015  . Left elbow contusion 04/03/2015  . Hyperglycemia 04/03/2015  . A-fib 04/08/2014  . Carpal tunnel syndrome 01/23/2014  . DDD (degenerative disc disease), cervical 01/23/2014  . Cervical spinal stenosis 01/23/2014  . Cerebral vascular accident 04/11/2015  . DDD (degenerative disc disease), lumbar 09/11/2014  .  Heart valve disease 04/08/2014  . HLD (hyperlipidemia) 04/08/2014  . Neuritis or radiculitis due to rupture of lumbar intervertebral disc 09/11/2014  . Degenerative arthritis of lumbar spine 01/23/2014  . AF (paroxysmal atrial fibrillation) 04/11/2015  . Nerve  root pain 01/23/2014  . Episode of syncope 04/11/2015    Social History:  Social History   Social History  . Marital Status: Married    Spouse Name: N/A  . Number of Children: N/A  . Years of Education: N/A   Occupational History  . Not on file.   Social History Main Topics  . Smoking status: Never Smoker   . Smokeless tobacco: Not on file  . Alcohol Use: No  . Drug Use: No  . Sexual Activity: Not on file   Other Topics Concern  . Not on file   Social History Narrative    Medications:   Current Outpatient Prescriptions on File Prior to Visit  Medication Sig Dispense Refill  . albuterol (PROAIR HFA) 108 (90 BASE) MCG/ACT inhaler Inhale into the lungs every 6 (six) hours as needed for wheezing or shortness of breath.     Marland Kitchen aspirin EC 81 MG tablet Take 81 mg by mouth daily.    . clopidogrel (PLAVIX) 75 MG tablet Take 75 mg by mouth at bedtime.     . digoxin (LANOXIN) 0.125 MG tablet Take 0.125 mg by mouth daily.     . furosemide (LASIX) 20 MG tablet     . gabapentin (NEURONTIN) 400 MG capsule     . Multiple Vitamins-Minerals (CENTRUM SILVER ADULT 50+ PO) Take by mouth daily.    . rosuvastatin (CRESTOR) 20 MG tablet Take 20 mg by mouth at bedtime.    . simethicone (PHAZYME) 125 MG chewable tablet Chew 125 mg by mouth 2 (two) times daily.    . traMADol (ULTRAM) 50 MG tablet     . vitamin B-12 100 MCG tablet Take 1 tablet (100 mcg total) by mouth daily.     No current facility-administered medications on file prior to visit.    Allergies:  No Known Allergies  Physical Exam General: well developed, well nourished, seated, in no evident distress Head: head normocephalic and atraumatic.  Neck: supple with no carotid or supraclavicular bruits Cardiovascular: regular rate and rhythm, soft ejection systolic murmur Musculoskeletal: no deformity Skin:  no rash/petichiae Vascular:  Normal pulses all extremities Filed Vitals:   06/16/15 1009  BP: 119/68  Pulse: 68    Neurologic Exam Mental Status: Awake and fully alert. Oriented to place and time. Recent and remote memory intact. Attention span, concentration and fund of knowledge appropriate. Mood and affect appropriate.  Cranial Nerves: Fundoscopic exam reveals sharp disc margins. Pupils equal, briskly reactive to light. Extraocular movements full without nystagmus. Visual fields full to confrontation. Hearing intact. Left lower facial weakness. Facial sensation intact. Face, tongue, palate moves normally and symmetrically.  Motor: Normal bulk and tone. Normal strength in all tested extremity muscles. Mild left grip weakness. Diminished fine finger movements on the left. Orbits right over left upper extremity. Sensory.: intact to touch ,pinprick .position and vibratory sensation.  Coordination: Rapid alternating movements normal in all extremities. Finger-to-nose and heel-to-shin performed accurately bilaterally. Gait and Station: Arises from chair without difficulty. Stance is normal. Gait demonstrates normal stride length and balance . Unable to heel, toe and tandem walk without difficulty.  Reflexes: 1+ and symmetric. Toes downgoing.   NIHSS  1 Modified Rankin  2  ASSESSMENT: 89 year with embolic right MCA  branch infarct in July 2016. Vascular risk factors of hypertension, hyperlipidemia and multivessel intracranial atherosclerosis.    PLAN: I had a long d/w patient and friend Stanton Kidney about his recent stroke, risk for recurrent stroke/TIAs, personally independently reviewed imaging studies and stroke evaluation results and answered questions.Continue aspirin 81 mg orally every day and clopidogrel 75 mg orally every day  for secondary stroke prevention and maintain strict control of hypertension with blood pressure goal below 130/90, diabetes with hemoglobin A1c goal below 6.5% and lipids with LDL cholesterol goal below 100 mg/dL. I also advised the patient to eat a healthy diet with plenty of whole  grains, cereals, fruits and vegetables, exercise regularly and maintain ideal body weight .he was also advised to maintain adequate hydration and continue outpatient physical and occupational therapy. If the patient has continuing bruising or bleeding episodes may discontinue Plavix and change the aspirin dose to 325 mg daily.Greater than 50% of time during this 25 minute visit was spent on counseling, discussion with patient and family and coordination of care Followup in the future with me in  6 months or call earlier if necessary.  Antony Contras, MD  Note: This document was prepared with digital dictation and possible smart phrase technology. Any transcriptional errors that result from this process are unintentional

## 2015-06-17 ENCOUNTER — Ambulatory Visit: Payer: Medicare PPO | Admitting: Physical Therapy

## 2015-06-17 DIAGNOSIS — G8194 Hemiplegia, unspecified affecting left nondominant side: Secondary | ICD-10-CM

## 2015-06-17 DIAGNOSIS — M6281 Muscle weakness (generalized): Secondary | ICD-10-CM

## 2015-06-17 DIAGNOSIS — R531 Weakness: Secondary | ICD-10-CM | POA: Diagnosis not present

## 2015-06-17 DIAGNOSIS — IMO0002 Reserved for concepts with insufficient information to code with codable children: Secondary | ICD-10-CM

## 2015-06-17 NOTE — Patient Instructions (Signed)
Neuromuscular training: Tandem stand with head turns x 2 minutes Side stepping with head control Stepping onto AIREX, then on to 4 inch step followed by stepping down onto AIREX and then level surface in //bars x15. Pt required occasional UE assist, and performance improved with each repetition  Stepping over and back x10 bilaterally  Stepping over and back x`10 bilaterally with verbal cueing to perform exercise as fast as possible Side step and back x10 bilaterally Side step and back x10 bilaterally with verbal cueing to perform exercise as fast as possible Side stepping on blue foam beam x 5  There ex: Leg press with 90#x 10, followed by 100# x10 and then 120# x10  sit to stand 2x10  Mini squats in //bars x10 CGA and Min to mod verbal cues used throughout with increased in postural sway and LOB most seen with narrow base of support and while on uneven surfaces. Continues to have balance deficits typical with diagnosis. Patient performs intermediate level exercises without pain behaviors and needs verbal cuing for postural alignment and head positioning Tactile cues and assistance needed to keep lower leg and knee in neutral to avoid compensations with ankle motions.

## 2015-06-17 NOTE — Therapy (Signed)
[place] MAIN [facility] SERVICES [address] [place], [place], [address] Phone: [phone]   Fax:  [phone]  Physical Therapy Treatment  Patient Details  Name: Nicholas Elliott MRN: [id number] Date of Birth: December 18, 1921 Referring Provider:  [name], MD  Encounter Date: 06/17/2015      PT End of Session - 06/17/15 1612    Visit Number 6   Number of Visits 17   Date for PT Re-Evaluation 07/15/15   PT Start Time 0400   PT Stop Time 0445   PT Time Calculation (min) 45 min   Equipment Utilized During Treatment Gait belt   Behavior During Therapy [facility] for tasks assessed/performed      Past Medical History  Diagnosis Date  . High cholesterol   . TIA (transient ischemic attack)   . Arthritis   . Glaucoma   . Migraine   . CLL (chronic lymphocytic leukemia)   . Facial droop 04/02/2015  . High cholesterol   . TIA (transient ischemic attack)   . Arthritis   . Migraine   . Stroke 04/02/2015  . CLL (chronic lymphocytic leukemia)   . Left hip pain 04/03/2015  . Forehead contusion 04/03/2015  . Left elbow contusion 04/03/2015  . Hyperglycemia 04/03/2015  . A-fib 04/08/2014  . Carpal tunnel syndrome 01/23/2014  . DDD (degenerative disc disease), cervical 01/23/2014  . Cervical spinal stenosis 01/23/2014  . Cerebral vascular accident 04/11/2015  . DDD (degenerative disc disease), lumbar 09/11/2014  . Heart valve disease 04/08/2014  . HLD (hyperlipidemia) 04/08/2014  . Neuritis or radiculitis due to rupture of lumbar intervertebral disc 09/11/2014  . Degenerative arthritis of lumbar spine 01/23/2014  . AF (paroxysmal atrial fibrillation) 04/11/2015  . Nerve root pain 01/23/2014  . Episode of syncope 04/11/2015    Past Surgical History  Procedure Laterality Date  . Upper palate reparin    . Spine surgery      There were no vitals filed for this visit.  Visit Diagnosis:  Muscle weakness  Lack of coordination due to stroke  Weakness  generalized  Hemiplegia affecting left nondominant side      Subjective Assessment - 06/17/15 1612    Subjective Patient says that he was very sore from his last visit   Currently in Pain? No/denies         Neuromuscular training: Tandem stand with head turns x 2 minutes Side stepping with head control Stepping onto AIREX, then on to 4 inch step followed by stepping down onto AIREX and then level surface in //bars x15. Pt required occasional UE assist, and performance improved with each repetition  Tapping to stool bilateral alternating LE's  Blue foam walking fwd x 10 feet, side stepping x 10 feet Cones reaching across midline on blue foam and cones reaching from disk and blue foam  There ex: Leg press with 90#x 20 x 3  CGA and Min to mod verbal cues used throughout with increased in postural sway and LOB most seen with narrow base of support and while on uneven surfaces. Continues to have balance deficits typical with diagnosis. Patient performs intermediate level exercises without pain behaviors and needs verbal cuing for postural alignment and head positioning Tactile cues and assistance needed to keep lower leg and knee in neutral to avoid compensations with ankle motions.                         PT Education - 06/17/15 1611  Education provided Yes   Education Details HEP   Person(s) Educated Patient   Methods Explanation   Comprehension Verbalized understanding             PT Long Term Goals - 05/20/15 1445    PT LONG TERM GOAL #1   Title Patient will tolerate 5 seconds of single leg stance without loss of balance to improve ability to get in and out of shower safely   PT LONG TERM GOAL #2   Title Patient will be independent in home exercise program to improve strength/mobility for better functional independence with  ADLs   PT LONG TERM GOAL #3   Title Patient will reduce timed up and go to <11 seconds to reduce fall risk and demonstrate  improved transfer/gait ability.               Plan - 06/17/15 1615    Clinical Impression Statement Continues to have balance deficits typical with diagnosis. Patient performs intermediate level exercises without pain behaviors and needs verbal cuing for postural alignment and head positioning.   Pt will benefit from skilled therapeutic intervention in order to improve on the following deficits Decreased strength;Decreased balance;Abnormal gait;Difficulty walking   Rehab Potential Good   PT Frequency 2x / week   PT Duration 8 weeks   PT Next Visit Plan progress HEP   Consulted and Agree with Plan of Care Patient        Problem List Patient Active Problem List   Diagnosis Date Noted  . Cerebral vascular accident 04/11/2015  . AF (paroxysmal atrial fibrillation) 04/11/2015  . Episode of syncope 04/11/2015  . Left hip pain 04/03/2015  . Forehead contusion 04/03/2015  . Left elbow contusion 04/03/2015  . Hyperglycemia 04/03/2015  . Facial droop 04/02/2015  . Stroke 04/02/2015  . High cholesterol   . TIA (transient ischemic attack)   . Arthritis   . Migraine   . CLL (chronic lymphocytic leukemia)   . DDD (degenerative disc disease), lumbar 09/11/2014  . Neuritis or radiculitis due to rupture of lumbar intervertebral disc 09/11/2014  . A-fib 04/08/2014  . Heart valve disease 04/08/2014  . HLD (hyperlipidemia) 04/08/2014  . Carpal tunnel syndrome 01/23/2014  . DDD (degenerative disc disease), cervical 01/23/2014  . Cervical spinal stenosis 01/23/2014  . Degenerative arthritis of lumbar spine 01/23/2014  . Nerve root pain 01/23/2014    [name] 06/17/2015, 4:22 PM  [place] MAIN [facility] SERVICES [address] [place], [place], [address] Phone: [phone]   Fax:  [phone]

## 2015-06-19 ENCOUNTER — Encounter: Payer: Self-pay | Admitting: Physical Therapy

## 2015-06-19 ENCOUNTER — Ambulatory Visit: Payer: Medicare PPO | Admitting: Physical Therapy

## 2015-06-19 DIAGNOSIS — G8194 Hemiplegia, unspecified affecting left nondominant side: Secondary | ICD-10-CM

## 2015-06-19 DIAGNOSIS — R531 Weakness: Secondary | ICD-10-CM | POA: Diagnosis not present

## 2015-06-19 DIAGNOSIS — M6281 Muscle weakness (generalized): Secondary | ICD-10-CM

## 2015-06-19 DIAGNOSIS — IMO0002 Reserved for concepts with insufficient information to code with codable children: Secondary | ICD-10-CM

## 2015-06-19 NOTE — Therapy (Signed)
[place] MAIN [facility] SERVICES [address] [place], [place], [address] Phone: [phone]   Fax:  [phone]  Physical Therapy Treatment  Patient Details  Name: Nicholas Elliott MRN: [id number] Date of Birth: May 02, 1922 Referring Provider:  [name], MD  Encounter Date: 06/19/2015      PT End of Session - 06/19/15 1536    Visit Number 7   Number of Visits 17   Date for PT Re-Evaluation 07/15/15   PT Start Time 0330   PT Stop Time 0410   PT Time Calculation (min) 40 min   Equipment Utilized During Treatment Gait belt   Behavior During Therapy [facility] for tasks assessed/performed      Past Medical History  Diagnosis Date  . High cholesterol   . TIA (transient ischemic attack)   . Arthritis   . Glaucoma   . Migraine   . CLL (chronic lymphocytic leukemia)   . Facial droop 04/02/2015  . High cholesterol   . TIA (transient ischemic attack)   . Arthritis   . Migraine   . Stroke 04/02/2015  . CLL (chronic lymphocytic leukemia)   . Left hip pain 04/03/2015  . Forehead contusion 04/03/2015  . Left elbow contusion 04/03/2015  . Hyperglycemia 04/03/2015  . A-fib 04/08/2014  . Carpal tunnel syndrome 01/23/2014  . DDD (degenerative disc disease), cervical 01/23/2014  . Cervical spinal stenosis 01/23/2014  . Cerebral vascular accident 04/11/2015  . DDD (degenerative disc disease), lumbar 09/11/2014  . Heart valve disease 04/08/2014  . HLD (hyperlipidemia) 04/08/2014  . Neuritis or radiculitis due to rupture of lumbar intervertebral disc 09/11/2014  . Degenerative arthritis of lumbar spine 01/23/2014  . AF (paroxysmal atrial fibrillation) 04/11/2015  . Nerve root pain 01/23/2014  . Episode of syncope 04/11/2015    Past Surgical History  Procedure Laterality Date  . Upper palate reparin    . Spine surgery      There were no vitals filed for this visit.  Visit Diagnosis:  Muscle weakness  Lack of coordination due to stroke  Weakness  generalized  Hemiplegia affecting left nondominant side      Subjective Assessment - 06/19/15 1535    Subjective Patient says that he is doing better today.   Pain Score 0-No pain              Neuromuscular training: Tandem stand with head turns x 2 minutes Side stepping with head control Stepping onto AIREX, then on to 4 inch step followed by stepping down onto AIREX and then level surface in //bars x15. Pt required occasional UE assist, and performance improved with each repetition  Stepping over and back x10 bilaterally  Stepping over and back x`10 bilaterally with verbal cueing to perform exercise as fast as possible Side step and back x10 bilaterally Side step and back x10 bilaterally with verbal cueing to perform exercise as fast as possible Side stepping on blue foam beam x 5  There ex: TM x 1.8 x 5 minutes Leg press with 90#x 10, followed by 100# x10 and then 120# x10  sit to stand 2x10  Mini squats in //bars x10          Patient needs occasional verbal cueing to improve posture and cueing to correctly perform exercises slowly, holding at end of range to increase motor firing of desired muscle to encourage fatigue.  PT Education - 06/19/15 1536    Education provided Yes   Education Details HEP   Methods Explanation   Comprehension Verbalized understanding             PT Long Term Goals - 05/20/15 1445    PT LONG TERM GOAL #1   Title Patient will tolerate 5 seconds of single leg stance without loss of balance to improve ability to get in and out of shower safely   PT LONG TERM GOAL #2   Title Patient will be independent in home exercise program to improve strength/mobility for better functional independence with  ADLs   PT LONG TERM GOAL #3   Title Patient will reduce timed up and go to <11 seconds to reduce fall risk and demonstrate improved transfer/gait ability.               Plan - 06/19/15  1537    Clinical Impression Statement Fatigue with sit to stand but demonstrating more control    Pt will benefit from skilled therapeutic intervention in order to improve on the following deficits Decreased strength;Decreased balance;Abnormal gait;Difficulty walking   Rehab Potential Good   PT Frequency 2x / week   PT Duration 8 weeks   PT Next Visit Plan progress HEP   Consulted and Agree with Plan of Care Patient        Problem List Patient Active Problem List   Diagnosis Date Noted  . Cerebral vascular accident 04/11/2015  . AF (paroxysmal atrial fibrillation) 04/11/2015  . Episode of syncope 04/11/2015  . Left hip pain 04/03/2015  . Forehead contusion 04/03/2015  . Left elbow contusion 04/03/2015  . Hyperglycemia 04/03/2015  . Facial droop 04/02/2015  . Stroke 04/02/2015  . High cholesterol   . TIA (transient ischemic attack)   . Arthritis   . Migraine   . CLL (chronic lymphocytic leukemia)   . DDD (degenerative disc disease), lumbar 09/11/2014  . Neuritis or radiculitis due to rupture of lumbar intervertebral disc 09/11/2014  . A-fib 04/08/2014  . Heart valve disease 04/08/2014  . HLD (hyperlipidemia) 04/08/2014  . Carpal tunnel syndrome 01/23/2014  . DDD (degenerative disc disease), cervical 01/23/2014  . Cervical spinal stenosis 01/23/2014  . Degenerative arthritis of lumbar spine 01/23/2014  . Nerve root pain 01/23/2014    [name] 06/19/2015, 3:39 PM  [place] MAIN [facility] SERVICES [address] [place], [place], [address] Phone: [phone]   Fax:  [phone]

## 2015-06-24 ENCOUNTER — Ambulatory Visit: Payer: Medicare PPO | Attending: Family Medicine | Admitting: Physical Therapy

## 2015-06-24 ENCOUNTER — Encounter: Payer: Self-pay | Admitting: Physical Therapy

## 2015-06-24 DIAGNOSIS — IMO0002 Reserved for concepts with insufficient information to code with codable children: Secondary | ICD-10-CM

## 2015-06-24 DIAGNOSIS — R531 Weakness: Secondary | ICD-10-CM | POA: Diagnosis present

## 2015-06-24 DIAGNOSIS — I6980 Unspecified sequelae of other cerebrovascular disease: Secondary | ICD-10-CM | POA: Insufficient documentation

## 2015-06-24 DIAGNOSIS — G8194 Hemiplegia, unspecified affecting left nondominant side: Secondary | ICD-10-CM | POA: Diagnosis present

## 2015-06-24 DIAGNOSIS — M6281 Muscle weakness (generalized): Secondary | ICD-10-CM

## 2015-06-24 DIAGNOSIS — R279 Unspecified lack of coordination: Secondary | ICD-10-CM | POA: Insufficient documentation

## 2015-06-24 NOTE — Therapy (Signed)
[place] MAIN [facility] SERVICES [address] [place], [place], [address] Phone: [phone]   Fax:  [phone]  Physical Therapy Treatment  Patient Details  Name: Nicholas Elliott MRN: [id number] Date of Birth: 08/13/1922 Referring Provider:  [name], MD  Encounter Date: 06/24/2015      PT End of Session - 06/24/15 1117    Visit Number 8   Number of Visits 17   Date for PT Re-Evaluation 07/15/15   PT Start Time 1100   PT Stop Time 1145   PT Time Calculation (min) 45 min   Equipment Utilized During Treatment Gait belt   Behavior During Therapy [facility] for tasks assessed/performed      Past Medical History  Diagnosis Date  . High cholesterol   . TIA (transient ischemic attack)   . Arthritis   . Glaucoma   . Migraine   . CLL (chronic lymphocytic leukemia) ([place])   . Facial droop 04/02/2015  . High cholesterol   . TIA (transient ischemic attack)   . Arthritis   . Migraine   . Stroke ([place]) 04/02/2015  . CLL (chronic lymphocytic leukemia) ([place])   . Left hip pain 04/03/2015  . Forehead contusion 04/03/2015  . Left elbow contusion 04/03/2015  . Hyperglycemia 04/03/2015  . A-fib ([place]) 04/08/2014  . Carpal tunnel syndrome 01/23/2014  . DDD (degenerative disc disease), cervical 01/23/2014  . Cervical spinal stenosis 01/23/2014  . Cerebral vascular accident ([place]) 04/11/2015  . DDD (degenerative disc disease), lumbar 09/11/2014  . Heart valve disease 04/08/2014  . HLD (hyperlipidemia) 04/08/2014  . Neuritis or radiculitis due to rupture of lumbar intervertebral disc 09/11/2014  . Degenerative arthritis of lumbar spine 01/23/2014  . AF (paroxysmal atrial fibrillation) ([place]) 04/11/2015  . Nerve root pain 01/23/2014  . Episode of syncope 04/11/2015    Past Surgical History  Procedure Laterality Date  . Upper palate reparin    . Spine surgery      There were no vitals filed for this visit.  Visit Diagnosis:  Lack of coordination due to  stroke  Muscle weakness  Weakness generalized  Hemiplegia affecting left nondominant side ([place]) one reaching across midline        Subjective Assessment - 06/24/15 1114    Subjective Patient is not feeling very well today. He was up a lot during the night gong to the bathroom.    Currently in Pain? No/denies      Therapeutic exercise including : Blue foam walking fwd/bwd, side stepping Cones on blue foam reaching activities One foot on disk and the other on the blue foam with cone reaching across midline  Leg press with 60 lbs TM walking side stepping . 2 M/sec with loss of balance  standing hip 4 way  with YTB x 20  Min cueing needed to appropriately perform tasks with leg, hand, and head position. Decreased coordination demonstrated requiring consistent verbal cueing to correct form. Patient continues to demonstrate some in coordination of movement with select exercises such as stepping backwards. Patient responds well to verbal and tactile cues to correct form and technique.  CGA to SBA for safety with activities.  Uses to increase intensity  of movements throughout session                            PT Education - 06/24/15 1116    Education provided Yes   Education Details HEP   Person(s) Educated Patient  Methods Explanation   Comprehension Verbalized understanding             PT Long Term Goals - 05/20/15 1445    PT LONG TERM GOAL #1   Title Patient will tolerate 5 seconds of single leg stance without loss of balance to improve ability to get in and out of shower safely   PT LONG TERM GOAL #2   Title Patient will be independent in home exercise program to improve strength/mobility for better functional independence with  ADLs   PT LONG TERM GOAL #3   Title Patient will reduce timed up and go to <11 seconds to reduce fall risk and demonstrate improved transfer/gait ability.               Plan - 06/24/15 1117    Clinical  Impression Statement Motor control of LE much improved.  Muscle fatigue but no major pain complaints.   Pt will benefit from skilled therapeutic intervention in order to improve on the following deficits Decreased strength;Decreased balance;Abnormal gait;Difficulty walking   Rehab Potential Good   PT Frequency 2x / week   PT Duration 8 weeks   PT Next Visit Plan progress HEP   Consulted and Agree with Plan of Care Patient        Problem List Patient Active Problem List   Diagnosis Date Noted  . Cerebral vascular accident ([place]) 04/11/2015  . AF (paroxysmal atrial fibrillation) ([place]) 04/11/2015  . Episode of syncope 04/11/2015  . Left hip pain 04/03/2015  . Forehead contusion 04/03/2015  . Left elbow contusion 04/03/2015  . Hyperglycemia 04/03/2015  . Facial droop 04/02/2015  . Stroke ([place]) 04/02/2015  . High cholesterol   . TIA (transient ischemic attack)   . Arthritis   . Migraine   . CLL (chronic lymphocytic leukemia) ([place])   . DDD (degenerative disc disease), lumbar 09/11/2014  . Neuritis or radiculitis due to rupture of lumbar intervertebral disc 09/11/2014  . A-fib ([place]) 04/08/2014  . Heart valve disease 04/08/2014  . HLD (hyperlipidemia) 04/08/2014  . Carpal tunnel syndrome 01/23/2014  . DDD (degenerative disc disease), cervical 01/23/2014  . Cervical spinal stenosis 01/23/2014  . Degenerative arthritis of lumbar spine 01/23/2014  . Nerve root pain 01/23/2014    [name] 06/24/2015, 11:50 AM  [place] MAIN [facility] SERVICES [address] [place], [place], [address] Phone: [phone]   Fax:  [phone]

## 2015-06-26 ENCOUNTER — Encounter: Payer: Self-pay | Admitting: Urology

## 2015-06-26 ENCOUNTER — Ambulatory Visit (INDEPENDENT_AMBULATORY_CARE_PROVIDER_SITE_OTHER): Payer: Medicare PPO | Admitting: Urology

## 2015-06-26 ENCOUNTER — Ambulatory Visit: Payer: Medicare PPO | Admitting: Physical Therapy

## 2015-06-26 ENCOUNTER — Encounter: Payer: Self-pay | Admitting: Physical Therapy

## 2015-06-26 VITALS — BP 104/59 | HR 75 | Ht 73.0 in | Wt 167.1 lb

## 2015-06-26 DIAGNOSIS — M6281 Muscle weakness (generalized): Secondary | ICD-10-CM

## 2015-06-26 DIAGNOSIS — N471 Phimosis: Secondary | ICD-10-CM

## 2015-06-26 DIAGNOSIS — R531 Weakness: Secondary | ICD-10-CM

## 2015-06-26 DIAGNOSIS — G8194 Hemiplegia, unspecified affecting left nondominant side: Secondary | ICD-10-CM

## 2015-06-26 DIAGNOSIS — N3281 Overactive bladder: Secondary | ICD-10-CM | POA: Diagnosis not present

## 2015-06-26 DIAGNOSIS — I6980 Unspecified sequelae of other cerebrovascular disease: Secondary | ICD-10-CM | POA: Diagnosis not present

## 2015-06-26 DIAGNOSIS — IMO0002 Reserved for concepts with insufficient information to code with codable children: Secondary | ICD-10-CM

## 2015-06-26 MED ORDER — SOLIFENACIN SUCCINATE 10 MG PO TABS: 10.0000 mg | ORAL_TABLET | Freq: Every day | ORAL | Status: DC

## 2015-06-26 NOTE — Therapy (Signed)
[place] MAIN [facility] SERVICES [address] [place], [place], [address] Phone: [phone]   Fax:  [phone]  Physical Therapy Treatment  Patient Details  Name: Nicholas Elliott MRN: [id number] Date of Birth: 1921-11-24 Referring Provider:  [name], MD  Encounter Date: 06/26/2015      PT End of Session - 06/26/15 1657    Visit Number 9   Number of Visits 17   Date for PT Re-Evaluation 07/15/15   PT Start Time 0400   PT Stop Time 0445   PT Time Calculation (min) 45 min   Equipment Utilized During Treatment Gait belt   Behavior During Therapy [facility] for tasks assessed/performed      Past Medical History  Diagnosis Date  . High cholesterol   . TIA (transient ischemic attack)   . Arthritis   . Glaucoma   . Migraine   . CLL (chronic lymphocytic leukemia) ([place])   . Facial droop 04/02/2015  . High cholesterol   . TIA (transient ischemic attack)   . Arthritis   . Migraine   . Stroke ([place]) 04/02/2015  . CLL (chronic lymphocytic leukemia) ([place])   . Left hip pain 04/03/2015  . Forehead contusion 04/03/2015  . Left elbow contusion 04/03/2015  . Hyperglycemia 04/03/2015  . A-fib ([place]) 04/08/2014  . Carpal tunnel syndrome 01/23/2014  . DDD (degenerative disc disease), cervical 01/23/2014  . Cervical spinal stenosis 01/23/2014  . Cerebral vascular accident ([place]) 04/11/2015  . DDD (degenerative disc disease), lumbar 09/11/2014  . Heart valve disease 04/08/2014  . HLD (hyperlipidemia) 04/08/2014  . Neuritis or radiculitis due to rupture of lumbar intervertebral disc 09/11/2014  . Degenerative arthritis of lumbar spine 01/23/2014  . AF (paroxysmal atrial fibrillation) ([place]) 04/11/2015  . Nerve root pain 01/23/2014  . Episode of syncope 04/11/2015    Past Surgical History  Procedure Laterality Date  . Upper palate reparin    . Spine surgery      There were no vitals filed for this visit.  Visit Diagnosis:  Lack of coordination due to  stroke  Muscle weakness  Weakness generalized  Hemiplegia affecting left nondominant side (HCC)      Subjective Assessment - 06/26/15 1657    Subjective Patient is not feeling very well today. He was up a lot during the night gong to the bathroom.    Pain Score 0-No pain             standing hip abd with YTB x 20  side stepping left and right in parallel bars 10 feet x 3 standing on blue foam with cone reaching x 20 across midline step ups from floor to 6 inch stool x 20 bilateral sit to stand x 10 marching in parallel bars x 20 stepping pattern with weight shifting fwd/bwd x 10.  Patient continues to demonstrates less incoordination of movement with select exercises such and stepping backwards. Patient responds well to verbal and tactile cues to correct form and technique. Patient is able to catch mistakes in technique with incorrect positions and is able remember the start and finish positions. Motor control of LE much improved.  Muscle fatigue but no major pain complaints.                     PT Education - 06/26/15 1657    Education provided Yes   Education Details HEP   Person(s) Educated Patient   Methods Explanation   Comprehension Verbalized understanding  PT Long Term Goals - 05/20/15 1445    PT LONG TERM GOAL #1   Title Patient will tolerate 5 seconds of single leg stance without loss of balance to improve ability to get in and out of shower safely   PT LONG TERM GOAL #2   Title Patient will be independent in home exercise program to improve strength/mobility for better functional independence with  ADLs   PT LONG TERM GOAL #3   Title Patient will reduce timed up and go to <11 seconds to reduce fall risk and demonstrate improved transfer/gait ability.               Plan - 06/26/15 1658    Clinical Impression Statement Continues to have balance deficits typical with diagnosis. Patient performs intermediate level  exercises without pain behaviors and needs verbal cuing for postural alignment and head positioning   Pt will benefit from skilled therapeutic intervention in order to improve on the following deficits Decreased strength;Decreased balance;Abnormal gait;Difficulty walking   Rehab Potential Good   PT Frequency 2x / week   PT Duration 8 weeks   PT Next Visit Plan progress HEP   Consulted and Agree with Plan of Care Patient        Problem List Patient Active Problem List   Diagnosis Date Noted  . Cerebral vascular accident ([place]) 04/11/2015  . AF (paroxysmal atrial fibrillation) ([place]) 04/11/2015  . Episode of syncope 04/11/2015  . Cerebrovascular accident (CVA) ([place]) 04/11/2015  . Paroxysmal atrial fibrillation ([place]) 04/11/2015  . Left hip pain 04/03/2015  . Forehead contusion 04/03/2015  . Left elbow contusion 04/03/2015  . Hyperglycemia 04/03/2015  . Facial droop 04/02/2015  . Stroke ([place]) 04/02/2015  . High cholesterol   . TIA (transient ischemic attack)   . Arthritis   . Migraine   . CLL (chronic lymphocytic leukemia) ([place])   . Chronic lymphocytic leukemia ([place]) 09/16/2014  . DDD (degenerative disc disease), lumbar 09/11/2014  . Neuritis or radiculitis due to rupture of lumbar intervertebral disc 09/11/2014  . A-fib ([place]) 04/08/2014  . Heart valve disease 04/08/2014  . HLD (hyperlipidemia) 04/08/2014  . Atrial fibrillation ([place]) 04/08/2014  . Carpal tunnel syndrome 01/23/2014  . DDD (degenerative disc disease), cervical 01/23/2014  . Cervical spinal stenosis 01/23/2014  . Degenerative arthritis of lumbar spine 01/23/2014  . Nerve root pain 01/23/2014  . Degeneration of intervertebral disc of cervical region 01/23/2014    [name] 06/26/2015, 5:00 PM  [place] MAIN [facility] SERVICES [address] [place], [place], [address] Phone: [phone]   Fax:  [phone]

## 2015-06-26 NOTE — Progress Notes (Signed)
06/26/2015 10:34 AM   Nicholas Elliott 1922/02/14 [id number]  Referring provider: [name], MD [place], [place] [address]  Chief Complaint  Patient presents with  . Penis Pain    pt comes in today to discuss possible getting surgery for paraphimosis    HPI: Patient misunderstood my directions for him to check with his cardiologist to stop his Plavix 7 days before circumcision. We will stop his some clopidogrel   7 days before planned circumcision and cystoscopy. He has incontinence probably secondary to CVA in July. It's a new thing in his life so it's probably stroke related samples of Vesicare given today.    PMH: Past Medical History  Diagnosis Date  . High cholesterol   . TIA (transient ischemic attack)   . Arthritis   . Glaucoma   . Migraine   . CLL (chronic lymphocytic leukemia) ([place])   . Facial droop 04/02/2015  . High cholesterol   . TIA (transient ischemic attack)   . Arthritis   . Migraine   . Stroke ([place]) 04/02/2015  . CLL (chronic lymphocytic leukemia) ([place])   . Left hip pain 04/03/2015  . Forehead contusion 04/03/2015  . Left elbow contusion 04/03/2015  . Hyperglycemia 04/03/2015  . A-fib ([place]) 04/08/2014  . Carpal tunnel syndrome 01/23/2014  . DDD (degenerative disc disease), cervical 01/23/2014  . Cervical spinal stenosis 01/23/2014  . Cerebral vascular accident ([place]) 04/11/2015  . DDD (degenerative disc disease), lumbar 09/11/2014  . Heart valve disease 04/08/2014  . HLD (hyperlipidemia) 04/08/2014  . Neuritis or radiculitis due to rupture of lumbar intervertebral disc 09/11/2014  . Degenerative arthritis of lumbar spine 01/23/2014  . AF (paroxysmal atrial fibrillation) ([place]) 04/11/2015  . Nerve root pain 01/23/2014  . Episode of syncope 04/11/2015    Surgical History: Past Surgical History  Procedure Laterality Date  . Upper palate reparin    . Spine surgery      Home Medications:      Medication List       This list is accurate as of: 06/26/15 10:34 AM.  Always use your most recent med list.               aspirin EC 81 MG tablet  Take 81 mg by mouth daily.     CENTRUM SILVER ADULT 50+ PO  Take by mouth daily.     clopidogrel 75 MG tablet  Commonly known as:  PLAVIX  Take 75 mg by mouth at bedtime.     CRESTOR 20 MG tablet  Generic drug:  rosuvastatin  Take 20 mg by mouth at bedtime.     cyanocobalamin 100 MCG tablet  Take 1 tablet (100 mcg total) by mouth daily.     digoxin 0.125 MG tablet  Commonly known as:  LANOXIN  Take 0.125 mg by mouth daily.     furosemide 20 MG tablet  Commonly known as:  LASIX     gabapentin 400 MG capsule  Commonly known as:  NEURONTIN     PHAZYME 125 MG chewable tablet  Generic drug:  simethicone  Chew 125 mg by mouth 2 (two) times daily.     PROAIR HFA 108 (90 BASE) MCG/ACT inhaler  Generic drug:  albuterol  Inhale into the lungs every 6 (six) hours as needed for wheezing or shortness of breath.     traMADol 50 MG tablet  Commonly known as:  Veatrice [place]  Allergies: No Known Allergies  Family History: Family History  Problem Relation Age of Onset  . Stroke Father   . Cancer Sister   . Heart attack Brother   . Osteoarthritis Mother   . Prostate cancer Neg Hx     Social History:  reports that he has never smoked. He does not have any smokeless tobacco history on file. He reports that he does not drink alcohol or use illicit drugs.  ROS: UROLOGY Frequent Urination?: Yes Hard to postpone urination?: No Burning/pain with urination?: No Get up at night to urinate?: Yes Leakage of urine?: No Urine stream starts and stops?: No Trouble starting stream?: No Do you have to strain to urinate?: No Blood in urine?: No Urinary tract infection?: No Sexually transmitted disease?: No Injury to kidneys or bladder?: No Painful intercourse?: No Weak stream?: No Erection problems?: No Penile pain?:  No  Gastrointestinal Nausea?: No Indigestion/heartburn?: No Diarrhea?: No Constipation?: No  Constitutional Fever: No Night sweats?: No Weight loss?: No Fatigue?: No  Skin Skin rash/lesions?: No Itching?: No  Eyes Blurred vision?: No Double vision?: Yes  Ears/Nose/Throat Sore throat?: No Sinus problems?: No  Hematologic/Lymphatic Swollen glands?: No Easy bruising?: Yes  Cardiovascular Leg swelling?: Yes Chest pain?: No  Respiratory Cough?: No Shortness of breath?: No  Endocrine Excessive thirst?: No  Musculoskeletal Back pain?: No Joint pain?: No  Neurological Headaches?: No Dizziness?: No  Psychologic Depression?: Yes Anxiety?: No  Physical Exam: BP 104/59 mmHg  Pulse 75  Ht 6\' 1"  (1.854 m)  Wt 167 lb 1.6 oz (75.796 kg)  BMI 22.05 kg/m2  Constitutional:  Alert and oriented, No acute distress. HEENT: [place] AT, moist mucus membranes.  Trachea midline, no masses. Cardiovascular: No clubbing, cyanosis, or edema. Respiratory: Normal respiratory effort, no increased work of breathing. GI: Abdomen is soft, nontender, nondistended, no abdominal masses GU: No CVA tenderness. Phimosis required Skin: No rashes, bruises or suspicious lesions. Lymph: No cervical or inguinal adenopathy. Neurologic: Grossly intact, no focal deficits, moving all 4 extremities. Psychiatric: Normal mood and affect.  Laboratory Data: Lab Results  Component Value Date   WBC 45.9* 06/11/2015   HGB 13.2 06/11/2015   HCT 40.4 06/11/2015   MCV 97.6 06/11/2015   PLT 145* 06/11/2015    Lab Results  Component Value Date   CREATININE 0.76 04/07/2015    No results found for: PSA  No results found for: TESTOSTERONE  Lab Results  Component Value Date   HGBA1C 6.1* 04/03/2015    Urinalysis    Component Value Date/Time   COLORURINE YELLOW 04/02/2015 2020   COLORURINE Yellow 11/22/2013 1941   APPEARANCEUR CLEAR 04/02/2015 2020   APPEARANCEUR Hazy 11/22/2013 1941    LABSPEC 1.023 04/02/2015 2020   LABSPEC 1.021 11/22/2013 1941   PHURINE 5.5 04/02/2015 2020   PHURINE 5.0 11/22/2013 1941   GLUCOSEU 250* 04/02/2015 2020   GLUCOSEU Negative 11/22/2013 1941   HGBUR SMALL* 04/02/2015 2020   HGBUR Negative 11/22/2013 1941   BILIRUBINUR NEGATIVE 04/02/2015 2020   BILIRUBINUR Negative 11/22/2013 1941   KETONESUR 15* 04/02/2015 2020   KETONESUR Trace 11/22/2013 1941   PROTEINUR NEGATIVE 04/02/2015 2020   PROTEINUR 30 mg/dL 11/22/2013 1941   UROBILINOGEN 0.2 04/02/2015 2020   NITRITE NEGATIVE 04/02/2015 2020   NITRITE Negative 11/22/2013 1941   LEUKOCYTESUR NEGATIVE 04/02/2015 2020   LEUKOCYTESUR Negative 11/22/2013 1941    Pertinent Imaging: None  Assessment & Plan:    CVA in July and is now incontinent urine. I started him on  samples of Vesicare 10 mg daily  1. Overactive bladder will treat with anticholinergics have. I've written for a Vesicare 10 mg daily. We'll check a PSA on his next visit and do a cystoscopy to make sure some outlet obstruction type problem in addition to his CVA.   2. Acquired phimosis reduced his paraphimosis a month ago. Patient is only been off his clopidogrel for 4 days. Needs to be off it to 7 days and needs to clearance from his internist so he can come off his clopidogrel. He had a stroke in July 2016 so I don't know when we can do the circumcision for his phimosis but he like it done.   No Follow-up on file.  Collier Flowers, [facility] [address] [place],  [address] [phone]

## 2015-06-30 ENCOUNTER — Ambulatory Visit: Payer: Medicare PPO | Admitting: Physical Therapy

## 2015-06-30 DIAGNOSIS — I10 Essential (primary) hypertension: Secondary | ICD-10-CM | POA: Insufficient documentation

## 2015-07-01 ENCOUNTER — Ambulatory Visit: Payer: Medicare PPO | Admitting: Physical Therapy

## 2015-07-01 ENCOUNTER — Encounter: Payer: Self-pay | Admitting: Physical Therapy

## 2015-07-01 DIAGNOSIS — M6281 Muscle weakness (generalized): Secondary | ICD-10-CM

## 2015-07-01 DIAGNOSIS — IMO0002 Reserved for concepts with insufficient information to code with codable children: Secondary | ICD-10-CM

## 2015-07-01 DIAGNOSIS — G8194 Hemiplegia, unspecified affecting left nondominant side: Secondary | ICD-10-CM

## 2015-07-01 DIAGNOSIS — R531 Weakness: Secondary | ICD-10-CM

## 2015-07-01 DIAGNOSIS — I6980 Unspecified sequelae of other cerebrovascular disease: Secondary | ICD-10-CM | POA: Diagnosis not present

## 2015-07-01 NOTE — Therapy (Signed)
[place] MAIN [facility] SERVICES [address] [place], [place], [address] Phone: [phone]   Fax:  [phone]  Physical Therapy Treatment  Patient Details  Name: Nicholas Elliott MRN: [id number] Date of Birth: April 15, 1922 Referring Provider:  [name], MD  Encounter Date: 07/01/2015      PT End of Session - 07/01/15 1615    Visit Number 10   Number of Visits 17   Date for PT Re-Evaluation 07/15/15   PT Start Time 0400   PT Stop Time 0445   PT Time Calculation (min) 45 min   Equipment Utilized During Treatment Gait belt   Behavior During Therapy [facility] for tasks assessed/performed      Past Medical History  Diagnosis Date  . High cholesterol   . TIA (transient ischemic attack)   . Arthritis   . Glaucoma   . Migraine   . CLL (chronic lymphocytic leukemia) ([place])   . Facial droop 04/02/2015  . High cholesterol   . TIA (transient ischemic attack)   . Arthritis   . Migraine   . Stroke ([place]) 04/02/2015  . CLL (chronic lymphocytic leukemia) ([place])   . Left hip pain 04/03/2015  . Forehead contusion 04/03/2015  . Left elbow contusion 04/03/2015  . Hyperglycemia 04/03/2015  . A-fib ([place]) 04/08/2014  . Carpal tunnel syndrome 01/23/2014  . DDD (degenerative disc disease), cervical 01/23/2014  . Cervical spinal stenosis 01/23/2014  . Cerebral vascular accident ([place]) 04/11/2015  . DDD (degenerative disc disease), lumbar 09/11/2014  . Heart valve disease 04/08/2014  . HLD (hyperlipidemia) 04/08/2014  . Neuritis or radiculitis due to rupture of lumbar intervertebral disc 09/11/2014  . Degenerative arthritis of lumbar spine 01/23/2014  . AF (paroxysmal atrial fibrillation) ([place]) 04/11/2015  . Nerve root pain 01/23/2014  . Episode of syncope 04/11/2015    Past Surgical History  Procedure Laterality Date  . Upper palate reparin    . Spine surgery      There were no vitals filed for this visit.  Visit Diagnosis:  Lack of coordination due to  stroke  Muscle weakness  Weakness generalized  Hemiplegia affecting left nondominant side (HCC)      Subjective Assessment - 07/01/15 1615    Subjective Patient is not feeling very well today. He was up a lot during the night gong to the bathroom.    Currently in Pain? No/denies   Pain Score 0-No pain      Outcome measures:  5 x sit to stand 14.08 sec  10 MW 1.31 m/sec  TUG n   11.55 sec 6 [id number]    feet Therapeutic exercise including: 1/2 foam curve side down with balloon toss Leg press 90 lbs x 20 x 3, heel raises with 90 lbs 20 x 3 Machine knee flex x 20 x 2, knee extension x 20 x 2 2 plates CGA and Min to mod verbal cues used throughout with increased in postural sway and LOB most seen with narrow base of support and while on uneven surfaces. Continues to have balance deficits typical with diagnosis. Patient performs intermediate level exercises without pain behaviors and needs verbal cuing for postural alignment and head positioning Tactile cues and assistance needed to keep lower leg and knee in neutral to avoid compensations with ankle motions.                         PT Education - 07/01/15 1622    Education provided Yes  Person(s) Educated Patient   Methods Explanation   Comprehension Verbalized understanding             PT Long Term Goals - 05/20/15 1445    PT LONG TERM GOAL #1   Title Patient will tolerate 5 seconds of single leg stance without loss of balance to improve ability to get in and out of shower safely   PT LONG TERM GOAL #2   Title Patient will be independent in home exercise program to improve strength/mobility for better functional independence with  ADLs   PT LONG TERM GOAL #3   Title Patient will reduce timed up and go to <11 seconds to reduce fall risk and demonstrate improved transfer/gait ability.               Plan - 2015-07-11 1618    Clinical Impression Statement Fatigue with sit to stand but  demonstrating more control, Increase weight for standing exercises   Pt will benefit from skilled therapeutic intervention in order to improve on the following deficits Decreased strength;Decreased balance;Abnormal gait;Difficulty walking   Rehab Potential Good   PT Frequency 2x / week   PT Duration 8 weeks   PT Next Visit Plan progress HEP   Consulted and Agree with Plan of Care Patient          G-Codes - 11-Jul-2015 1619    Functional Assessment Tool Used tug 10 mw 5 x sit to stand   Functional Limitation Mobility: Walking and moving around   Mobility: Walking and Moving Around Current Status [phone]) At least 20 percent but less than 40 percent impaired, limited or restricted   Mobility: Walking and Moving Around Goal Status [phone]) At least 1 percent but less than 20 percent impaired, limited or restricted      Problem List Patient Active Problem List   Diagnosis Date Noted  . Cerebral vascular accident ([place]) 04/11/2015  . AF (paroxysmal atrial fibrillation) ([place]) 04/11/2015  . Episode of syncope 04/11/2015  . Cerebrovascular accident (CVA) ([place]) 04/11/2015  . Paroxysmal atrial fibrillation ([place]) 04/11/2015  . Left hip pain 04/03/2015  . Forehead contusion 04/03/2015  . Left elbow contusion 04/03/2015  . Hyperglycemia 04/03/2015  . Facial droop 04/02/2015  . Stroke ([place]) 04/02/2015  . High cholesterol   . TIA (transient ischemic attack)   . Arthritis   . Migraine   . CLL (chronic lymphocytic leukemia) ([place])   . Chronic lymphocytic leukemia ([place]) 09/16/2014  . DDD (degenerative disc disease), lumbar 09/11/2014  . Neuritis or radiculitis due to rupture of lumbar intervertebral disc 09/11/2014  . A-fib ([place]) 04/08/2014  . Heart valve disease 04/08/2014  . HLD (hyperlipidemia) 04/08/2014  . Atrial fibrillation ([place]) 04/08/2014  . Carpal tunnel syndrome 01/23/2014  . DDD (degenerative disc disease), cervical 01/23/2014  . Cervical spinal stenosis 01/23/2014  . Degenerative  arthritis of lumbar spine 01/23/2014  . Nerve root pain 01/23/2014  . Degeneration of intervertebral disc of cervical region 01/23/2014    [name] 07-11-15, 4:32 PM  [place] MAIN [facility] SERVICES [address] [place], [place], [address] Phone: [phone]   Fax:  [phone]

## 2015-07-03 ENCOUNTER — Ambulatory Visit: Payer: Medicare PPO | Admitting: Physical Therapy

## 2015-07-03 ENCOUNTER — Encounter: Payer: Self-pay | Admitting: Physical Therapy

## 2015-07-03 DIAGNOSIS — IMO0002 Reserved for concepts with insufficient information to code with codable children: Secondary | ICD-10-CM

## 2015-07-03 DIAGNOSIS — I6980 Unspecified sequelae of other cerebrovascular disease: Secondary | ICD-10-CM | POA: Diagnosis not present

## 2015-07-03 DIAGNOSIS — R531 Weakness: Secondary | ICD-10-CM

## 2015-07-03 DIAGNOSIS — M6281 Muscle weakness (generalized): Secondary | ICD-10-CM

## 2015-07-03 NOTE — Therapy (Signed)
[place] MAIN [facility] SERVICES [address] [place], [place], [address] Phone: [phone]   Fax:  [phone]  Physical Therapy Treatment/Discharge summary  Patient Details  Name: Nicholas Elliott MRN: [id number] Date of Birth: May 16, 1922 Referring Provider:  [name], MD  Encounter Date: 07/03/2015      PT End of Session - 07/03/15 1115    Visit Number 11   Number of Visits 17   Date for PT Re-Evaluation 07/15/15   PT Start Time 1100   PT Stop Time 1145   PT Time Calculation (min) 45 min   Equipment Utilized During Treatment Gait belt   Behavior During Therapy [facility] for tasks assessed/performed      Past Medical History  Diagnosis Date  . High cholesterol   . TIA (transient ischemic attack)   . Arthritis   . Glaucoma   . Migraine   . CLL (chronic lymphocytic leukemia) ([place])   . Facial droop 04/02/2015  . High cholesterol   . TIA (transient ischemic attack)   . Arthritis   . Migraine   . Stroke ([place]) 04/02/2015  . CLL (chronic lymphocytic leukemia) ([place])   . Left hip pain 04/03/2015  . Forehead contusion 04/03/2015  . Left elbow contusion 04/03/2015  . Hyperglycemia 04/03/2015  . A-fib ([place]) 04/08/2014  . Carpal tunnel syndrome 01/23/2014  . DDD (degenerative disc disease), cervical 01/23/2014  . Cervical spinal stenosis 01/23/2014  . Cerebral vascular accident ([place]) 04/11/2015  . DDD (degenerative disc disease), lumbar 09/11/2014  . Heart valve disease 04/08/2014  . HLD (hyperlipidemia) 04/08/2014  . Neuritis or radiculitis due to rupture of lumbar intervertebral disc 09/11/2014  . Degenerative arthritis of lumbar spine 01/23/2014  . AF (paroxysmal atrial fibrillation) ([place]) 04/11/2015  . Nerve root pain 01/23/2014  . Episode of syncope 04/11/2015    Past Surgical History  Procedure Laterality Date  . Upper palate reparin    . Spine surgery      There were no vitals filed for this visit.  Visit Diagnosis:  Lack of  coordination due to stroke  Muscle weakness  Weakness generalized      Subjective Assessment - 07/03/15 1114    Subjective Patient is doing well today.    Pain Score 0-No pain            Reviewed HEP for balance and strengthening Machine leg press x 20 x 3 sets, machine heel raise x 20 x 3 sets Knee flex machine x 15 x 2 sets plate 3 Knee extension machine 15 x 2 sets , plate 1  standing hip abd with YTB x 20  side stepping left and right in parallel bars 10 feet x 3 standing on blue foam with cone reaching x 20 across midline step ups from floor to 6 inch stool x 20 bilateral sit to stand x 10 marching in parallel bars x 20 stepping pattern with weight shifting fwd/bwd x 10.    CGA and Min to mod verbal cues used throughout with increased in postural sway  while on uneven surfaces. Continues to have mild  balance deficits typical with diagnosis. Patient performs intermediate level exercises without pain behaviors and needs verbal cuing for postural alignment and head positioning.                  PT Education - 07/03/15 1114    Education provided Yes   Education Details HEP   Person(s) Educated Patient   Methods Explanation  Comprehension Verbalized understanding             PT Long Term Goals - 07-14-15 1116    PT LONG TERM GOAL #1   Status Achieved   PT LONG TERM GOAL #2   Status Achieved   PT LONG TERM GOAL #3   Status Achieved               Plan - 07/14/2015 1115    Clinical Impression Statement Patient is independent with HEP and falls risk has decreased to low falls risk with improved outcome measures. Patient will be DC to HEP today.    Pt will benefit from skilled therapeutic intervention in order to improve on the following deficits Decreased strength;Decreased balance;Abnormal gait;Difficulty walking   Rehab Potential Good   PT Frequency 2x / week   PT Duration 8 weeks   PT Next Visit Plan progress HEP   Consulted and  Agree with Plan of Care Patient          G-Codes - July 14, 2015 1117    Functional Assessment Tool Used tug 10 mw 5 x sit to stand   Functional Limitation Mobility: Walking and moving around   Mobility: Walking and Moving Around Goal Status [phone]) At least 1 percent but less than 20 percent impaired, limited or restricted   Mobility: Walking and Moving Around Discharge Status [phone]) At least 1 percent but less than 20 percent impaired, limited or restricted      Problem List Patient Active Problem List   Diagnosis Date Noted  . Cerebral vascular accident ([place]) 04/11/2015  . AF (paroxysmal atrial fibrillation) ([place]) 04/11/2015  . Episode of syncope 04/11/2015  . Cerebrovascular accident (CVA) ([place]) 04/11/2015  . Paroxysmal atrial fibrillation ([place]) 04/11/2015  . Left hip pain 04/03/2015  . Forehead contusion 04/03/2015  . Left elbow contusion 04/03/2015  . Hyperglycemia 04/03/2015  . Facial droop 04/02/2015  . Stroke ([place]) 04/02/2015  . High cholesterol   . TIA (transient ischemic attack)   . Arthritis   . Migraine   . CLL (chronic lymphocytic leukemia) ([place])   . Chronic lymphocytic leukemia ([place]) 09/16/2014  . DDD (degenerative disc disease), lumbar 09/11/2014  . Neuritis or radiculitis due to rupture of lumbar intervertebral disc 09/11/2014  . A-fib ([place]) 04/08/2014  . Heart valve disease 04/08/2014  . HLD (hyperlipidemia) 04/08/2014  . Atrial fibrillation ([place]) 04/08/2014  . Carpal tunnel syndrome 01/23/2014  . DDD (degenerative disc disease), cervical 01/23/2014  . Cervical spinal stenosis 01/23/2014  . Degenerative arthritis of lumbar spine 01/23/2014  . Nerve root pain 01/23/2014  . Degeneration of intervertebral disc of cervical region 01/23/2014    [name] 07/14/15, 11:18 AM  [place] MAIN [facility] SERVICES [address] [place], [place], [address] Phone: [phone]   Fax:  [phone]

## 2015-07-23 ENCOUNTER — Ambulatory Visit: Payer: Medicare PPO | Admitting: Urology

## 2015-08-01 ENCOUNTER — Encounter: Payer: Self-pay | Admitting: Urology

## 2015-08-01 ENCOUNTER — Ambulatory Visit (INDEPENDENT_AMBULATORY_CARE_PROVIDER_SITE_OTHER): Payer: Medicare PPO | Admitting: Urology

## 2015-08-01 VITALS — BP 123/62 | HR 73 | Ht 73.0 in | Wt 166.8 lb

## 2015-08-01 DIAGNOSIS — N3941 Urge incontinence: Secondary | ICD-10-CM | POA: Diagnosis not present

## 2015-08-01 DIAGNOSIS — N400 Enlarged prostate without lower urinary tract symptoms: Secondary | ICD-10-CM

## 2015-08-01 DIAGNOSIS — N471 Phimosis: Secondary | ICD-10-CM

## 2015-08-01 LAB — MICROSCOPIC EXAMINATION: EPITHELIAL CELLS (NON RENAL): NONE SEEN /HPF (ref 0–10)

## 2015-08-01 LAB — URINALYSIS, COMPLETE
BILIRUBIN UA: NEGATIVE
Glucose, UA: NEGATIVE
Ketones, UA: NEGATIVE
LEUKOCYTES UA: NEGATIVE
Nitrite, UA: NEGATIVE
PH UA: 5 (ref 5.0–7.5)
Protein, UA: NEGATIVE
Specific Gravity, UA: >1.03 — ABNORMAL HIGH (ref 1.005–1.030)
Urobilinogen, Ur: 0.2 mg/dL (ref 0.2–1.0)

## 2015-08-01 MED ORDER — MIRABEGRON ER 25 MG PO TB24: 25.0000 mg | ORAL_TABLET | Freq: Every day | ORAL | Status: DC

## 2015-08-01 MED ORDER — CIPROFLOXACIN HCL 500 MG PO TABS
500.0000 mg | ORAL_TABLET | Freq: Once | ORAL | Status: AC
  Administered 2015-08-01: 500 mg via ORAL

## 2015-08-01 MED ORDER — LIDOCAINE HCL 2 % EX GEL
1.0000 "application " | Freq: Once | CUTANEOUS | Status: AC
  Administered 2015-08-01: 1 via URETHRAL

## 2015-08-01 NOTE — Addendum Note (Signed)
Addended by: Hollice Espy on: 08/01/2015 12:38 PM   Modules accepted: Orders, Medications

## 2015-08-01 NOTE — Progress Notes (Signed)
08/01/2015 12:20 PM   Nicholas Elliott 06-28-1922 [id number]  Referring provider: [name], MD [place], [place] [address]  Chief Complaint  Patient presents with  . Cysto    HPI: 81 year old male with new onset urinary urgency and urge incontinence following a stroke in July 2016. His symptoms have been stable without improvement since that time. He was prescribed Vesicare 10 mg which caused side effects including left hand cramping and headaches therefore he stopped this medication. He is very bothered by his urinary symptoms.  Prior to his stroke, he denies any significant urinary symptoms. He currently denies any obstructive symptoms including no decreased urinary stream or difficulty emptying his bladder. Bladder scan today prior to cystoscopy was a proximally 40 cc.  He is also had an episode of paraphimosis in the past and originally was scheduled for circumcision today. He reports that he has had no further episodes of this and is able to fully retract his foreskin without difficulty.   PMH: Past Medical History  Diagnosis Date  . High cholesterol   . TIA (transient ischemic attack)   . Arthritis   . Glaucoma   . Migraine   . CLL (chronic lymphocytic leukemia) ([place])   . Facial droop 04/02/2015  . High cholesterol   . TIA (transient ischemic attack)   . Arthritis   . Migraine   . Stroke ([place]) 04/02/2015  . CLL (chronic lymphocytic leukemia) ([place])   . Left hip pain 04/03/2015  . Forehead contusion 04/03/2015  . Left elbow contusion 04/03/2015  . Hyperglycemia 04/03/2015  . A-fib ([place]) 04/08/2014  . Carpal tunnel syndrome 01/23/2014  . DDD (degenerative disc disease), cervical 01/23/2014  . Cervical spinal stenosis 01/23/2014  . Cerebral vascular accident ([place]) 04/11/2015  . DDD (degenerative disc disease), lumbar 09/11/2014  . Heart valve disease 04/08/2014  . HLD (hyperlipidemia) 04/08/2014  . Neuritis  or radiculitis due to rupture of lumbar intervertebral disc 09/11/2014  . Degenerative arthritis of lumbar spine 01/23/2014  . AF (paroxysmal atrial fibrillation) ([place]) 04/11/2015  . Nerve root pain 01/23/2014  . Episode of syncope 04/11/2015    Surgical History: Past Surgical History  Procedure Laterality Date  . Upper palate reparin    . Spine surgery      Home Medications:    Medication List       This list is accurate as of: 08/01/15 12:20 PM.  Always use your most recent med list.               aspirin EC 81 MG tablet  Take 81 mg by mouth daily.     CENTRUM SILVER ADULT 50+ PO  Take by mouth daily.     clopidogrel 75 MG tablet  Commonly known as:  PLAVIX  Take 75 mg by mouth at bedtime.     CRESTOR 20 MG tablet  Generic drug:  rosuvastatin  Take 20 mg by mouth at bedtime.     cyanocobalamin 100 MCG tablet  Take 1 tablet (100 mcg total) by mouth daily.     digoxin 0.125 MG tablet  Commonly known as:  LANOXIN  Take 0.125 mg by mouth daily.     furosemide 20 MG tablet  Commonly known as:  LASIX     gabapentin 400 MG capsule  Commonly known as:  NEURONTIN     PHAZYME 125 MG chewable tablet  Generic drug:  simethicone  Chew 125 mg by mouth  2 (two) times daily.     PROAIR HFA 108 (90 BASE) MCG/ACT inhaler  Generic drug:  albuterol  Inhale into the lungs every 6 (six) hours as needed for wheezing or shortness of breath.     solifenacin 10 MG tablet  Commonly known as:  VESICARE  Take 1 tablet (10 mg total) by mouth daily.     traMADol 50 MG tablet  Commonly known as:  ULTRAM        Allergies: No Known Allergies  Family History: Family History  Problem Relation Age of Onset  . Stroke Father   . Cancer Sister   . Heart attack Brother   . Osteoarthritis Mother   . Prostate cancer Neg Hx     Social History:  reports that he has never smoked. He does not have any smokeless tobacco history on file. He reports that he does not drink alcohol or use  illicit drugs.  Physical Exam: BP 123/62 mmHg  Pulse 73  Ht 6\' 1"  (1.854 m)  Wt 166 lb 12.8 oz (75.66 kg)  BMI 22.01 kg/m2  Constitutional:  Alert and oriented, No acute distress. HEENT: [place] AT, moist mucus membranes.  Trachea midline, no masses. Cardiovascular: No clubbing, cyanosis, or edema. Respiratory: Normal respiratory effort, no increased work of breathing. GI: Abdomen is soft, nontender, nondistended, no abdominal masses GU: No CVA tenderness. Normal scrotum. Uncircumcised phallus with easily retractable foreskin. Skin: No rashes, bruises or suspicious lesions.  Neurologic: Grossly intact, no focal deficits, moving all 4 extremities. Psychiatric: Normal mood and affect.  Laboratory Data: Lab Results  Component Value Date   WBC 45.9* 06/11/2015   HGB 13.2 06/11/2015   HCT 40.4 06/11/2015   MCV 97.6 06/11/2015   PLT 145* 06/11/2015    Lab Results  Component Value Date   CREATININE 0.76 04/07/2015     Lab Results  Component Value Date   HGBA1C 6.1* 04/03/2015    Urinalysis Urine dipstick shows not done.  Micro exam: negative for WBC's or RBC's.   Cystoscopy Procedure Note  Patient identification was confirmed, informed consent was obtained, and patient was prepped using Betadine solution.  Lidocaine jelly was administered per urethral meatus.    Preoperative abx where received prior to procedure.     Pre-Procedure: - Inspection reveals a normal caliber ureteral meatus.  Procedure: The flexible cystoscope was introduced without difficulty - No urethral strictures/lesions are present. - Enlarged prostate with trilobar coaptation, 7 cm prostatic length - Elevated bladder neck - Bilateral ureteral orifices identified - Bladder mucosa  reveals no ulcers, tumors, or lesions - No bladder stones - Mild trabeculation  Retroflexion shows moderately enlarged prostate with mass effect into the bladder circumferentially, no well-circumscribed median  lobe.   Post-Procedure: - Patient tolerated the procedure well   Assessment & Plan:    1. Urge incontinence Cystoscopy today does show an enlarged prostate with some mild trabeculation of the bladder consistent with some degree of bladder outlet obstruction, however, he has relatively few obstructive symptoms.  Given patient's age and lack of outlet symptoms, do not recommend any surgical intervention at this time. Suspect urgency and urge incontinence related to overactivity following the stroke. Failed Vesicare recently, we will give 2 weeks Mirbetriq trial samples and script.   - Urinalysis, Complete -RTC in 6 weeks to reassess symptoms/ DRE  2. BPH (benign prostatic hyperplasia) As above  3. Phimosis Initially scheduled for circumcision today, however, patient does not desire procedures today given his lack of issues recently  with phimosis Or paraphimosis. He may restart his aspirin and Plavix and we will reassess at his next visit. He is much more concerned with his urge and urge incontinence.   Return in about 6 weeks (around 09/12/2015) for PVR, IPSS.  Hollice Espy, MD  [facility] [address], [place], [place] [address] [phone]

## 2015-08-01 NOTE — Addendum Note (Signed)
Addended by: Tommy Rainwater on: 08/01/2015 12:44 PM   Modules accepted: Orders

## 2015-08-30 ENCOUNTER — Emergency Department
Admission: EM | Admit: 2015-08-30 | Discharge: 2015-08-30 | Disposition: A | Discharge status: Home / Self Care | Payer: Medicare PPO | Attending: Emergency Medicine | Admitting: Emergency Medicine

## 2015-08-30 ENCOUNTER — Encounter: Payer: Self-pay | Admitting: Emergency Medicine

## 2015-08-30 DIAGNOSIS — I10 Essential (primary) hypertension: Secondary | ICD-10-CM | POA: Insufficient documentation

## 2015-08-30 DIAGNOSIS — Z79899 Other long term (current) drug therapy: Secondary | ICD-10-CM | POA: Insufficient documentation

## 2015-08-30 DIAGNOSIS — J018 Other acute sinusitis: Secondary | ICD-10-CM | POA: Diagnosis not present

## 2015-08-30 DIAGNOSIS — R0981 Nasal congestion: Secondary | ICD-10-CM | POA: Diagnosis present

## 2015-08-30 DIAGNOSIS — Z7982 Long term (current) use of aspirin: Secondary | ICD-10-CM | POA: Insufficient documentation

## 2015-08-30 MED ORDER — CHLORPHENIRAMINE MALEATE 4 MG PO TABS: 4.0000 mg | ORAL_TABLET | Freq: Two times a day (BID) | ORAL | Status: DC | PRN

## 2015-08-30 MED ORDER — AMOXICILLIN-POT CLAVULANATE 875-125 MG PO TABS: 1.0000 | ORAL_TABLET | Freq: Two times a day (BID) | ORAL | Status: AC

## 2015-08-30 NOTE — Discharge Instructions (Signed)

## 2015-08-30 NOTE — ED Notes (Signed)
Pt c/o runny nose and sneezing X 3 days.  Denies fevers.  Denies pain.  No other sx per pt.

## 2015-08-30 NOTE — ED Notes (Signed)
Verified with wife pt only here for sneezing and runny nose.  Pt is very hard of hearing and forgot hearing aids.  Difficult to understand r/t previous CVA.

## 2015-08-30 NOTE — ED Notes (Signed)
States he has had congestion and runny nose for about 3 days   Denies any other sx's or pain

## 2015-08-30 NOTE — ED Provider Notes (Signed)
[facility] Emergency Department Provider Note  ____________________________________________  Time seen: Approximately 3:13 PM  I have reviewed the triage vital signs and the nursing notes.   HISTORY  Chief Complaint Nasal Congestion and sneezing     HPI Nicholas Elliott is a 81 y.o. male who presents for evaluation of congestion runny nose and sinus pressure. Patient states denies any cough fever chills.   Past Medical History  Diagnosis Date  . High cholesterol   . TIA (transient ischemic attack)   . Arthritis   . Glaucoma   . Migraine   . CLL (chronic lymphocytic leukemia) ([place])   . Facial droop 04/02/2015  . High cholesterol   . TIA (transient ischemic attack)   . Arthritis   . Migraine   . Stroke ([place]) 04/02/2015  . CLL (chronic lymphocytic leukemia) ([place])   . Left hip pain 04/03/2015  . Forehead contusion 04/03/2015  . Left elbow contusion 04/03/2015  . Hyperglycemia 04/03/2015  . A-fib ([place]) 04/08/2014  . Carpal tunnel syndrome 01/23/2014  . DDD (degenerative disc disease), cervical 01/23/2014  . Cervical spinal stenosis 01/23/2014  . Cerebral vascular accident ([place]) 04/11/2015  . DDD (degenerative disc disease), lumbar 09/11/2014  . Heart valve disease 04/08/2014  . HLD (hyperlipidemia) 04/08/2014  . Neuritis or radiculitis due to rupture of lumbar intervertebral disc 09/11/2014  . Degenerative arthritis of lumbar spine 01/23/2014  . AF (paroxysmal atrial fibrillation) ([place]) 04/11/2015  . Nerve root pain 01/23/2014  . Episode of syncope 04/11/2015    Patient Active Problem List   Diagnosis Date Noted  . Benign essential HTN 06/30/2015  . Cerebral vascular accident ([place]) 04/11/2015  . AF (paroxysmal atrial fibrillation) ([place]) 04/11/2015  . Episode of syncope 04/11/2015  . Cerebrovascular accident (CVA) ([place]) 04/11/2015  . Paroxysmal atrial fibrillation ([place]) 04/11/2015  . Left hip pain 04/03/2015  . Forehead contusion 04/03/2015  . Left elbow  contusion 04/03/2015  . Hyperglycemia 04/03/2015  . Facial droop 04/02/2015  . Stroke ([place]) 04/02/2015  . High cholesterol   . TIA (transient ischemic attack)   . Arthritis   . Migraine   . CLL (chronic lymphocytic leukemia) ([place])   . Chronic lymphocytic leukemia ([place]) 09/16/2014  . DDD (degenerative disc disease), lumbar 09/11/2014  . Neuritis or radiculitis due to rupture of lumbar intervertebral disc 09/11/2014  . A-fib ([place]) 04/08/2014  . Heart valve disease 04/08/2014  . HLD (hyperlipidemia) 04/08/2014  . Atrial fibrillation ([place]) 04/08/2014  . Carpal tunnel syndrome 01/23/2014  . DDD (degenerative disc disease), cervical 01/23/2014  . Cervical spinal stenosis 01/23/2014  . Degenerative arthritis of lumbar spine 01/23/2014  . Nerve root pain 01/23/2014  . Degeneration of intervertebral disc of cervical region 01/23/2014    Past Surgical History  Procedure Laterality Date  . Upper palate reparin    . Spine surgery      Current Outpatient Rx  Name  Route  Sig  Dispense  Refill  . albuterol (PROAIR HFA) 108 (90 BASE) MCG/ACT inhaler   Inhalation   Inhale into the lungs every 6 (six) hours as needed for wheezing or shortness of breath.          Marland Kitchen amoxicillin-clavulanate (AUGMENTIN) 875-125 MG tablet   Oral   Take 1 tablet by mouth 2 (two) times daily.   14 tablet   0   . aspirin EC 81 MG tablet   Oral   Take 81 mg by mouth daily.         Marland Kitchen  chlorpheniramine (CHLOR-TRIMETON) 4 MG tablet   Oral   Take 1 tablet (4 mg total) by mouth 2 (two) times daily as needed for allergies or rhinitis.   30 tablet   0   . clopidogrel (PLAVIX) 75 MG tablet   Oral   Take 75 mg by mouth at bedtime.          . digoxin (LANOXIN) 0.125 MG tablet   Oral   Take 0.125 mg by mouth daily.          . furosemide (LASIX) 20 MG tablet               . gabapentin (NEURONTIN) 400 MG capsule               . mirabegron ER (MYRBETRIQ) 25 MG TB24 tablet   Oral   Take 1  tablet (25 mg total) by mouth daily.   30 tablet   2   . Multiple Vitamins-Minerals (CENTRUM SILVER ADULT 50+ PO)   Oral   Take by mouth daily.         . rosuvastatin (CRESTOR) 20 MG tablet   Oral   Take 20 mg by mouth at bedtime.         . simethicone (PHAZYME) 125 MG chewable tablet   Oral   Chew 125 mg by mouth 2 (two) times daily.         . traMADol (ULTRAM) 50 MG tablet               . vitamin B-12 100 MCG tablet   Oral   Take 1 tablet (100 mcg total) by mouth daily.           Allergies Review of patient's allergies indicates no known allergies.  Family History  Problem Relation Age of Onset  . Stroke Father   . Cancer Sister   . Heart attack Brother   . Osteoarthritis Mother   . Prostate cancer Neg Hx     Social History Social History  Substance Use Topics  . Smoking status: Never Smoker   . Smokeless tobacco: None  . Alcohol Use: No    Review of Systems Constitutional: No fever/chills Eyes: No visual changes. ENT: No sore throat. Positive for sinus congestion and pressure positive for runny nose. Cardiovascular: Denies chest pain. Respiratory: Denies shortness of breath. Eyes any coughing. Gastrointestinal: No abdominal pain.  No nausea, no vomiting.  No diarrhea.  No constipation. Genitourinary: Negative for dysuria. Musculoskeletal: Negative for back pain. Skin: Negative for rash. Neurological: Negative for headaches, focal weakness or numbness.  10-point ROS otherwise negative.  ____________________________________________   PHYSICAL EXAM:  VITAL SIGNS: ED Triage Vitals  Enc Vitals Group     BP 08/30/15 1346 139/59 mmHg     Pulse Rate 08/30/15 1346 97     Resp 08/30/15 1346 18     Temp 08/30/15 1346 98 F (36.7 C)     Temp Source 08/30/15 1346 Oral     SpO2 08/30/15 1346 94 %     Weight 08/30/15 1346 170 lb (77.111 kg)     Height 08/30/15 1346 6' (1.829 m)     Head Cir --      Peak Flow --      Pain Score --      Pain  Loc --      Pain Edu? --      Excl. in [place]? --     Constitutional: Alert and oriented. Well appearing and in no  acute distress. Eyes: Conjunctivae are normal. PERRL. EOMI. Head: Atraumatic. Nose: Positive congestion/rhinnorhea. Mouth/Throat: Mucous membranes are moist.  Oropharynx non-erythematous. Neck: No stridor.   Cardiovascular: Normal rate, regular rhythm. Grossly normal heart sounds.  Good peripheral circulation. Respiratory: Normal respiratory effort.  No retractions. Lungs CTAB. Gastrointestinal: Soft and nontender. No distention. No abdominal bruits. No CVA tenderness. Musculoskeletal: No lower extremity tenderness nor edema.  No joint effusions. Neurologic:  Normal speech and language. No gross focal neurologic deficits are appreciated. No gait instability. Skin:  Skin is warm, dry and intact. No rash noted. Psychiatric: Mood and affect are normal. Speech and behavior are normal.  ____________________________________________   LABS (all labs ordered are listed, but only abnormal results are displayed)  Labs Reviewed - No data to display ____________________________________________  PROCEDURES  Procedure(s) performed: None  Critical Care performed: No  ____________________________________________   INITIAL IMPRESSION / ASSESSMENT AND PLAN / ED COURSE  Pertinent labs & imaging results that were available during my care of the patient were reviewed by me and considered in my medical decision making (see chart for details).  Acute URI/sinusitis. Rx given for Augmentin 875 twice a day and chlorpheniramine as needed for drainage. Patient follow-up with PCP or return to the ER with any worsening symptomology. Patient voices no other emergency medical complaints at this visit. ____________________________________________   FINAL CLINICAL IMPRESSION(S) / ED DIAGNOSES  Final diagnoses:  Other acute sinusitis      Arlyss Repress, PA-C 08/30/15 1531  Lavonia Drafts, MD 08/31/15 1326

## 2015-09-03 ENCOUNTER — Inpatient Hospital Stay (HOSPITAL_BASED_OUTPATIENT_CLINIC_OR_DEPARTMENT_OTHER): Payer: Medicare PPO | Admitting: Oncology

## 2015-09-03 ENCOUNTER — Inpatient Hospital Stay: Payer: Medicare PPO | Attending: Oncology

## 2015-09-03 VITALS — BP 130/76 | HR 76 | Temp 96.7°F | Resp 16 | Wt 165.3 lb

## 2015-09-03 DIAGNOSIS — Z7982 Long term (current) use of aspirin: Secondary | ICD-10-CM | POA: Diagnosis not present

## 2015-09-03 DIAGNOSIS — E7800 Pure hypercholesterolemia, unspecified: Secondary | ICD-10-CM | POA: Insufficient documentation

## 2015-09-03 DIAGNOSIS — Z79899 Other long term (current) drug therapy: Secondary | ICD-10-CM | POA: Diagnosis not present

## 2015-09-03 DIAGNOSIS — I480 Paroxysmal atrial fibrillation: Secondary | ICD-10-CM | POA: Insufficient documentation

## 2015-09-03 DIAGNOSIS — R161 Splenomegaly, not elsewhere classified: Secondary | ICD-10-CM | POA: Diagnosis not present

## 2015-09-03 DIAGNOSIS — C9110 Chronic lymphocytic leukemia of B-cell type not having achieved remission: Secondary | ICD-10-CM | POA: Insufficient documentation

## 2015-09-03 DIAGNOSIS — Z8673 Personal history of transient ischemic attack (TIA), and cerebral infarction without residual deficits: Secondary | ICD-10-CM | POA: Insufficient documentation

## 2015-09-03 LAB — CBC WITH DIFFERENTIAL/PLATELET
BASOS ABS: 0.1 10*3/uL (ref 0–0.1)
Basophils Relative: 0 %
Eosinophils Absolute: 0.2 10*3/uL (ref 0–0.7)
Eosinophils Relative: 0 %
HEMATOCRIT: 41.6 % (ref 40.0–52.0)
Hemoglobin: 13.2 g/dL (ref 13.0–18.0)
LYMPHS ABS: 41.6 10*3/uL — AB (ref 1.0–3.6)
LYMPHS PCT: 87 %
MCH: 30.9 pg (ref 26.0–34.0)
MCHC: 31.7 g/dL — ABNORMAL LOW (ref 32.0–36.0)
MCV: 97.4 fL (ref 80.0–100.0)
MONO ABS: 0.9 10*3/uL (ref 0.2–1.0)
Monocytes Relative: 2 %
NEUTROS ABS: 5.4 10*3/uL (ref 1.4–6.5)
Neutrophils Relative %: 11 %
Platelets: 166 10*3/uL (ref 150–440)
RBC: 4.28 MIL/uL — ABNORMAL LOW (ref 4.40–5.90)
RDW: 13.9 % (ref 11.5–14.5)
WBC: 48.2 10*3/uL — ABNORMAL HIGH (ref 3.8–10.6)

## 2015-09-04 ENCOUNTER — Ambulatory Visit: Payer: Medicare PPO | Admitting: Oncology

## 2015-09-04 ENCOUNTER — Other Ambulatory Visit: Payer: Medicare PPO

## 2015-09-16 ENCOUNTER — Encounter: Payer: Self-pay | Admitting: Urology

## 2015-09-16 ENCOUNTER — Ambulatory Visit (INDEPENDENT_AMBULATORY_CARE_PROVIDER_SITE_OTHER): Payer: Medicare PPO | Admitting: Urology

## 2015-09-16 ENCOUNTER — Ambulatory Visit: Payer: Medicare PPO | Admitting: Urology

## 2015-09-16 VITALS — BP 119/70 | HR 81 | Resp 16 | Ht 73.0 in | Wt 167.7 lb

## 2015-09-16 DIAGNOSIS — N400 Enlarged prostate without lower urinary tract symptoms: Secondary | ICD-10-CM | POA: Diagnosis not present

## 2015-09-16 DIAGNOSIS — R3129 Other microscopic hematuria: Secondary | ICD-10-CM

## 2015-09-16 DIAGNOSIS — R32 Unspecified urinary incontinence: Secondary | ICD-10-CM

## 2015-09-16 DIAGNOSIS — N471 Phimosis: Secondary | ICD-10-CM

## 2015-09-16 LAB — MICROSCOPIC EXAMINATION

## 2015-09-16 LAB — URINALYSIS, COMPLETE
Bilirubin, UA: NEGATIVE
Glucose, UA: NEGATIVE
Ketones, UA: NEGATIVE
Leukocytes, UA: NEGATIVE
NITRITE UA: NEGATIVE
Protein, UA: NEGATIVE
Specific Gravity, UA: >1.03 — ABNORMAL HIGH (ref 1.005–1.030)
UUROB: 0.2 mg/dL (ref 0.2–1.0)
pH, UA: 5 (ref 5.0–7.5)

## 2015-09-16 LAB — BLADDER SCAN AMB NON-IMAGING: Scan Result: 53

## 2015-09-16 MED ORDER — MIRABEGRON ER 25 MG PO TB24: 25.0000 mg | ORAL_TABLET | Freq: Every day | ORAL | Status: DC

## 2015-09-16 NOTE — Progress Notes (Signed)
5:32 PM  09/16/2015   Nicholas Elliott 03-05-1922 [id number]  Referring provider: [name], MD [place], [place] [address]  Chief Complaint  Patient presents with  . Urinary Incontinence  . Benign Prostatic Hypertrophy    HPI: 81 year old male with new onset urinary urgency and urge incontinence following a stroke in July 2016.  He was prescribed Vesicare 10 mg which caused side effects including left hand cramping and headaches therefore he stopped this medication. He has been on Mybetriq 25 mg and had some significant improvement in his urinary symptoms including decrease in incontinence episodes.    He took all the samples of Mybetriq and filled one prescription but has not refilled it. He is concerned about the cost.  Prior to his stroke, he denies any significant urinary symptoms. He currently denies any obstructive symptoms including no decreased urinary stream or difficulty emptying his bladder.  Cysto last visit negative for pathology, mildly obstructive appearing prostate.  He is also had an episode of paraphimosis in the past.  He reports that he has had no further episodes of this and is able to fully retract his foreskin without difficulty.        IPSS      09/16/15 1400       International Prostate Symptom Score   How often have you had the sensation of not emptying your bladder? Less than half the time     How often have you had to urinate less than every two hours? Less than half the time     How often have you found you stopped and started again several times when you urinated? About half the time     How often have you found it difficult to postpone urination? Less than 1 in 5 times     How often have you had a weak urinary stream? Less than 1 in 5 times     How often have you had to strain to start urination? Less than 1 in 5 times     How many times did you typically get up at night  to urinate? 2 Times     Total IPSS Score 12     Quality of Life due to urinary symptoms   If you were to spend the rest of your life with your urinary condition just the way it is now how would you feel about that? Mostly Satisfied          PMH: Past Medical History  Diagnosis Date  . High cholesterol   . TIA (transient ischemic attack)   . Arthritis   . Glaucoma   . Migraine   . CLL (chronic lymphocytic leukemia) ([place])   . Facial droop 04/02/2015  . High cholesterol   . TIA (transient ischemic attack)   . Arthritis   . Migraine   . Stroke ([place]) 04/02/2015  . CLL (chronic lymphocytic leukemia) ([place])   . Left hip pain 04/03/2015  . Forehead contusion 04/03/2015  . Left elbow contusion 04/03/2015  . Hyperglycemia 04/03/2015  . A-fib ([place]) 04/08/2014  . Carpal tunnel syndrome 01/23/2014  . DDD (degenerative disc disease), cervical 01/23/2014  . Cervical spinal stenosis 01/23/2014  . Cerebral vascular accident ([place]) 04/11/2015  . DDD (degenerative disc disease), lumbar 09/11/2014  . Heart valve disease 04/08/2014  . HLD (hyperlipidemia) 04/08/2014  . Neuritis or radiculitis due to rupture of lumbar intervertebral disc 09/11/2014  . Degenerative arthritis  of lumbar spine 01/23/2014  . AF (paroxysmal atrial fibrillation) ([place]) 04/11/2015  . Nerve root pain 01/23/2014  . Episode of syncope 04/11/2015    Surgical History: Past Surgical History  Procedure Laterality Date  . Upper palate reparin    . Spine surgery      Home Medications:    Medication List       This list is accurate as of: 09/16/15  5:32 PM.  Always use your most recent med list.               aspirin EC 81 MG tablet  Take 81 mg by mouth daily.     CENTRUM SILVER ADULT 50+ PO  Take by mouth daily.     chlorpheniramine 4 MG tablet  Commonly known as:  CHLOR-TRIMETON  Take 1 tablet (4 mg total) by mouth 2 (two) times daily as needed for allergies or rhinitis.     clopidogrel 75 MG tablet  Commonly known as:   PLAVIX  Take 75 mg by mouth at bedtime.     CRESTOR 20 MG tablet  Generic drug:  rosuvastatin  Take 20 mg by mouth at bedtime.     cyanocobalamin 100 MCG tablet  Take 1 tablet (100 mcg total) by mouth daily.     digoxin 0.125 MG tablet  Commonly known as:  LANOXIN  Take 0.125 mg by mouth daily.     furosemide 20 MG tablet  Commonly known as:  LASIX     gabapentin 400 MG capsule  Commonly known as:  NEURONTIN     mirabegron ER 25 MG Tb24 tablet  Commonly known as:  MYRBETRIQ  Take 1 tablet (25 mg total) by mouth daily.     PHAZYME 125 MG chewable tablet  Generic drug:  simethicone  Chew 125 mg by mouth 2 (two) times daily.     PROAIR HFA 108 (90 [place]) MCG/ACT inhaler  Generic drug:  albuterol  Inhale into the lungs every 6 (six) hours as needed for wheezing or shortness of breath.     traMADol 50 MG tablet  Commonly known as:  ULTRAM        Allergies: No Known Allergies  Family History: Family History  Problem Relation Age of Onset  . Stroke Father   . Cancer Sister   . Heart attack Brother   . Osteoarthritis Mother   . Prostate cancer Neg Hx     Social History:  reports that he has never smoked. He does not have any smokeless tobacco history on file. He reports that he does not drink alcohol or use illicit drugs.  Physical Exam: BP 119/70 mmHg  Pulse 81  Resp 16  Ht 6\' 1"  (1.854 m)  Wt 167 lb 11.2 oz (76.068 kg)  BMI 22.13 kg/m2  Constitutional:  Alert and oriented, No acute distress. HEENT: [place] AT, moist mucus membranes.  Trachea midline, no masses. Cardiovascular: No clubbing, cyanosis, or edema. Respiratory: Normal respiratory effort, no increased work of breathing. GI: Abdomen is soft, nontender, nondistended, no abdominal masses GU: No CVA tenderness. Normal scrotum. Uncircumcised phallus with easily retractable foreskin. Skin: No rashes, bruises or suspicious lesions.  Neurologic: Grossly intact, no focal deficits, moving all 4  extremities. Psychiatric: Normal mood and affect.  Laboratory Data: Lab Results  Component Value Date   WBC 48.2* 09/03/2015   HGB 13.2 09/03/2015   HCT 41.6 09/03/2015   MCV 97.4 09/03/2015   PLT 166 09/03/2015    Lab Results  Component Value  Date   CREATININE 0.76 04/07/2015     Lab Results  Component Value Date   HGBA1C 6.1* 04/03/2015    Urinalysis Results for orders placed or performed in visit on 09/16/15  Microscopic Examination  Result Value Ref Range   WBC, UA 0-5 0 -  5 /hpf   RBC, UA 3-10 (A) 0 -  2 /hpf   Epithelial Cells (non renal) 0-10 0 - 10 /hpf   Mucus, UA Present (A) Not Estab.   Bacteria, UA Moderate (A) None seen/Few  Urinalysis, Complete  Result Value Ref Range   Specific Gravity, UA >1.030 (H) 1.005 - 1.030   pH, UA 5.0 5.0 - 7.5   Color, UA Yellow Yellow   Appearance Ur Clear Clear   Leukocytes, UA Negative Negative   Protein, UA Negative Negative/Trace   Glucose, UA Negative Negative   Ketones, UA Negative Negative   RBC, UA 2+ (A) Negative   Bilirubin, UA Negative Negative   Urobilinogen, Ur 0.2 0.2 - 1.0 mg/dL   Nitrite, UA Negative Negative   Microscopic Examination See below:   BLADDER SCAN AMB NON-IMAGING  Result Value Ref Range   Scan Result 53 mL       Assessment & Plan:    1. Urge incontinence  Suspect urgency and urge incontinence related to overactivity following the stroke. Failed Vesicare recently.  Doing well on Myrbetriq when taking. -Recommend continuation of Mybetriq  - Urinalysis, Complete  2. BPH (benign prostatic hyperplasia) As above.  Somewhat enlarged, obstructive appearing prostate on cystoscopy, minimal obstructive symptoms. Postvoid residual today minimal.  3. Phimosis Patient again brings up wanting a circumcision as he's been told he needs [place] in the past. Again, he does not have issues pulling back his foreskin, has only had one episode of paraphimosis in 12 years. He discussed at length today that I  do not feel that this is medically necessary given his very few issues. He is agreeable with this plan.  4. Microhematuria Previous cysto negative for tumors on 07/2015.  Given age and other comorbities, defer further work up including CT Urogram at this time.  RBC likely prostatic in origin or from foreskin in setting of ASA/ plavix.  May consider in future if persistent.    Return in about 1 year (around 09/15/2016) for IPSS, PVR (midlevel).  Hollice Espy, MD  [facility] [address] [place], [place] [address] [phone]

## 2015-09-20 NOTE — Progress Notes (Signed)
[facility]  Telephone:([phone] Fax:(336) [phone]  ID: Nicholas Elliott OB: 1922-02-19  MR#: [id number]  [id number]  Patient Care Team: [name], MD as PCP - General (Family Medicine)  CHIEF COMPLAINT:  Chief Complaint  Patient presents with  . CLL    INTERVAL HISTORY: Patient returns to clinic today for laboratory work and routine evaluation.  He continues to feel well and remains asymptomatic.  He denies any fevers, chills, or night sweats.  He denies any weight loss. He denies any chest pain or shortness of breath.  He has a good appetite and denies any nausea, vomiting, constipation, or diarrhea.  He has had no recent fevers or illnesses.  He has no urinary complaints.  Patient offers no specific complaints today.  REVIEW OF SYSTEMS:   Review of Systems  Constitutional: Negative for fever, chills, weight loss and malaise/fatigue.  Respiratory: Negative.   Cardiovascular: Negative.   Gastrointestinal: Negative.   Musculoskeletal: Negative.   Neurological: Negative.  Negative for weakness.  Endo/Heme/Allergies: Does not bruise/bleed easily.    As per HPI. Otherwise, a complete review of systems is negatve.  PAST MEDICAL HISTORY: Past Medical History  Diagnosis Date  . High cholesterol   . TIA (transient ischemic attack)   . Arthritis   . Glaucoma   . Migraine   . CLL (chronic lymphocytic leukemia) ([place])   . Facial droop 04/02/2015  . High cholesterol   . TIA (transient ischemic attack)   . Arthritis   . Migraine   . Stroke ([place]) 04/02/2015  . CLL (chronic lymphocytic leukemia) ([place])   . Left hip pain 04/03/2015  . Forehead contusion 04/03/2015  . Left elbow contusion 04/03/2015  . Hyperglycemia 04/03/2015  . A-fib ([place]) 04/08/2014  . Carpal tunnel syndrome 01/23/2014  . DDD (degenerative disc disease), cervical 01/23/2014  . Cervical spinal stenosis 01/23/2014  . Cerebral vascular accident ([place]) 04/11/2015  . DDD (degenerative disc  disease), lumbar 09/11/2014  . Heart valve disease 04/08/2014  . HLD (hyperlipidemia) 04/08/2014  . Neuritis or radiculitis due to rupture of lumbar intervertebral disc 09/11/2014  . Degenerative arthritis of lumbar spine 01/23/2014  . AF (paroxysmal atrial fibrillation) ([place]) 04/11/2015  . Nerve root pain 01/23/2014  . Episode of syncope 04/11/2015    PAST SURGICAL HISTORY: Past Surgical History  Procedure Laterality Date  . Upper palate reparin    . Spine surgery      FAMILY HISTORY Family History  Problem Relation Age of Onset  . Stroke Father   . Cancer Sister   . Heart attack Brother   . Osteoarthritis Mother   . Prostate cancer Neg Hx        ADVANCED DIRECTIVES:    HEALTH MAINTENANCE: Social History  Substance Use Topics  . Smoking status: Never Smoker   . Smokeless tobacco: Not on file  . Alcohol Use: No     Colonoscopy:  PAP:  Bone density:  Lipid panel:  No Known Allergies  Current Outpatient Prescriptions  Medication Sig Dispense Refill  . albuterol (PROAIR HFA) 108 (90 BASE) MCG/ACT inhaler Inhale into the lungs every 6 (six) hours as needed for wheezing or shortness of breath.     Marland Kitchen aspirin EC 81 MG tablet Take 81 mg by mouth daily.    . chlorpheniramine (CHLOR-TRIMETON) 4 MG tablet Take 1 tablet (4 mg total) by mouth 2 (two) times daily as needed for allergies or rhinitis. 30 tablet 0  . clopidogrel (PLAVIX) 75 MG tablet Take 75  mg by mouth at bedtime.     . digoxin (LANOXIN) 0.125 MG tablet Take 0.125 mg by mouth daily.     . furosemide (LASIX) 20 MG tablet     . gabapentin (NEURONTIN) 400 MG capsule     . Multiple Vitamins-Minerals (CENTRUM SILVER ADULT 50+ PO) Take by mouth daily.    . rosuvastatin (CRESTOR) 20 MG tablet Take 20 mg by mouth at bedtime.    . simethicone (PHAZYME) 125 MG chewable tablet Chew 125 mg by mouth 2 (two) times daily.    . traMADol (ULTRAM) 50 MG tablet     . vitamin B-12 100 MCG tablet Take 1 tablet (100 mcg total) by mouth  daily.    . mirabegron ER (MYRBETRIQ) 25 MG TB24 tablet Take 1 tablet (25 mg total) by mouth daily. 30 tablet 11   No current facility-administered medications for this visit.    OBJECTIVE: Filed Vitals:   09/03/15 1157  BP: 130/76  Pulse: 76  Temp: 96.7 F (35.9 C)  Resp: 16     Body mass index is 22.42 kg/(m^2).    ECOG FS:0 - Asymptomatic  General: Well-developed, well-nourished, no acute distress. Eyes: anicteric sclera. Lungs: Clear to auscultation bilaterally. Heart: Regular rate and rhythm. No rubs, murmurs, or gallops. Abdomen: Soft, nontender, nondistended. No organomegaly noted, normoactive bowel sounds. Musculoskeletal: No edema, cyanosis, or clubbing. Neuro: Alert, answering all questions appropriately. Cranial nerves grossly intact. Skin: No rashes or petechiae noted. Psych: Normal affect.   LAB RESULTS:  Lab Results  Component Value Date   NA 143 04/07/2015   K 4.6 04/07/2015   CL 109 04/07/2015   CO2 28 04/07/2015   GLUCOSE 117* 04/07/2015   BUN 23* 04/07/2015   CREATININE 0.76 04/07/2015   CALCIUM 8.8* 04/07/2015   PROT 6.2* 04/02/2015   ALBUMIN 3.9 04/02/2015   AST 44* 04/02/2015   ALT 22 04/02/2015   ALKPHOS 48 04/02/2015   BILITOT 0.6 04/02/2015   GFRNONAA >60 04/07/2015   GFRAA >60 04/07/2015    Lab Results  Component Value Date   WBC 48.2* 09/03/2015   NEUTROABS 5.4 09/03/2015   HGB 13.2 09/03/2015   HCT 41.6 09/03/2015   MCV 97.4 09/03/2015   PLT 166 09/03/2015     STUDIES: No results found.  ASSESSMENT: Rai stage 0 CLL, Zap 70 negative.  PLAN:    1.  CLL: Confirmed by flow cytometry.  Patient's white count continues to remain elevated, but stable and relatively unchanged.  No intervention is required.  Patient does not require treatment at this time.  Previously, CT of the chest, abdomen and pelvis only revealed mild splenomegaly.  This does not need to be repeated unless there is suspicion of progression. Return to clinic in 6  months for laboratory work and further evaluation.   Patient expressed understanding and was in agreement with this plan. He also understands that He can call clinic at any time with any questions, concerns, or complaints.    Lloyd Huger, MD   09/20/2015 11:09 AM

## 2015-11-24 ENCOUNTER — Ambulatory Visit (INDEPENDENT_AMBULATORY_CARE_PROVIDER_SITE_OTHER): Payer: Medicare PPO

## 2015-11-24 DIAGNOSIS — N390 Urinary tract infection, site not specified: Secondary | ICD-10-CM | POA: Diagnosis not present

## 2015-11-24 LAB — URINALYSIS, COMPLETE
Bilirubin, UA: NEGATIVE
Glucose, UA: NEGATIVE
Ketones, UA: NEGATIVE
NITRITE UA: NEGATIVE
PH UA: 5.5 (ref 5.0–7.5)
Protein, UA: NEGATIVE
Specific Gravity, UA: 1.01 (ref 1.005–1.030)
UUROB: 0.2 mg/dL (ref 0.2–1.0)

## 2015-11-24 LAB — MICROSCOPIC EXAMINATION
Bacteria, UA: NONE SEEN
EPITHELIAL CELLS (NON RENAL): NONE SEEN /HPF (ref 0–10)

## 2015-11-24 LAB — BLADDER SCAN AMB NON-IMAGING: Scan Result: 14

## 2015-11-24 NOTE — Progress Notes (Signed)
Bladder Scan: 14 Patient can void: 70 ml Performed By: Toniann Fail, LPN   Pt walked in c/o myrbetriq not working anymore. Pt stated that he just left [facility] and he already has to use the bathroom again. Pt stated there is a slight burning upon urination that started a week ago. U/a and cx was performed. PVR was 14. Pt made an appt to come back later in the week when ucx results return.

## 2015-11-26 ENCOUNTER — Telehealth: Payer: Self-pay

## 2015-11-26 LAB — CULTURE, URINE COMPREHENSIVE

## 2015-11-26 NOTE — Telephone Encounter (Signed)
-----   Message from Festus Aloe, MD sent at 11/26/2015  1:28 PM EST ----- Urine culture negative.    ----- Message -----    From: Lestine Box, LPN    Sent: [id number]  11:50 AM      To: Festus Aloe, MD    ----- Message -----    From: [facility] Lab Results In Interface    Sent: 11/24/2015   4:38 PM      To: Rowe [facility]

## 2015-11-26 NOTE — Telephone Encounter (Signed)
Spoke with pt and made aware of -ucx. Pt voiced understanding. Pt has appt on 11/28/15.

## 2015-11-28 ENCOUNTER — Ambulatory Visit (INDEPENDENT_AMBULATORY_CARE_PROVIDER_SITE_OTHER): Payer: Medicare Other | Admitting: Obstetrics and Gynecology

## 2015-11-28 ENCOUNTER — Encounter: Payer: Self-pay | Admitting: Obstetrics and Gynecology

## 2015-11-28 VITALS — BP 122/69 | HR 74 | Resp 16 | Ht 73.0 in | Wt 168.0 lb

## 2015-11-28 DIAGNOSIS — R300 Dysuria: Secondary | ICD-10-CM | POA: Diagnosis not present

## 2015-11-28 LAB — URINALYSIS, COMPLETE
Bilirubin, UA: NEGATIVE
GLUCOSE, UA: NEGATIVE
Ketones, UA: NEGATIVE
LEUKOCYTES UA: NEGATIVE
Nitrite, UA: NEGATIVE
PROTEIN UA: NEGATIVE
Specific Gravity, UA: 1.025 (ref 1.005–1.030)
UUROB: 0.2 mg/dL (ref 0.2–1.0)
pH, UA: 5 (ref 5.0–7.5)

## 2015-11-28 LAB — MICROSCOPIC EXAMINATION: Bacteria, UA: NONE SEEN

## 2015-11-28 NOTE — Progress Notes (Signed)
11:54 AM  11/28/2015   Nicholas Elliott 1922/06/13 [id number]  Referring provider: [name], MD [address] [place], [place] [address]  Chief Complaint  Patient presents with  . Dysuria    HPI: 82 year old male with new onset urinary urgency and urge incontinence following a stroke in July 2016.  He was prescribed Vesicare 10 mg which caused side effects including left hand cramping and headaches therefore he stopped this medication. He has been on Mybetriq 25 mg and had some significant improvement in his urinary symptoms including decrease in incontinence episodes.    He took all the samples of Mybetriq and filled one prescription but has not refilled it. He is concerned about the cost.  Prior to his stroke, he denies any significant urinary symptoms. He currently denies any obstructive symptoms including no decreased urinary stream or difficulty emptying his bladder.  Cysto last visit negative for pathology, mildly obstructive appearing prostate.  He is also had an episode of paraphimosis in the past.  He reports that he has had no further episodes of this and is able to fully retract his foreskin without difficulty.  Interval History 12/01/15 Patient reports that he has noticed some improvement on my Saralyn Pilar that he is not completely satisfied with his symptom management. He is experiencing no side effects.  PMH: Past Medical History  Diagnosis Date  . High cholesterol   . TIA (transient ischemic attack)   . Arthritis   . Glaucoma   . Migraine   . CLL (chronic lymphocytic leukemia) ([place])   . Facial droop 04/02/2015  . High cholesterol   . TIA (transient ischemic attack)   . Arthritis   . Migraine   . Stroke ([place]) 04/02/2015  . CLL (chronic lymphocytic leukemia) ([place])   . Left hip pain 04/03/2015  . Forehead contusion 04/03/2015  . Left elbow contusion 04/03/2015  . Hyperglycemia 04/03/2015  . A-fib ([place]) 04/08/2014  . Carpal tunnel syndrome 01/23/2014  . DDD  (degenerative disc disease), cervical 01/23/2014  . Cervical spinal stenosis 01/23/2014  . Cerebral vascular accident ([place]) 04/11/2015  . DDD (degenerative disc disease), lumbar 09/11/2014  . Heart valve disease 04/08/2014  . HLD (hyperlipidemia) 04/08/2014  . Neuritis or radiculitis due to rupture of lumbar intervertebral disc 09/11/2014  . Degenerative arthritis of lumbar spine 01/23/2014  . AF (paroxysmal atrial fibrillation) ([place]) 04/11/2015  . Nerve root pain 01/23/2014  . Episode of syncope 04/11/2015    Surgical History: Past Surgical History  Procedure Laterality Date  . Upper palate reparin    . Spine surgery      Home Medications:    Medication List       This list is accurate as of: 11/28/15 11:54 AM.  Always use your most recent med list.               aspirin EC 81 MG tablet  Take 81 mg by mouth daily.     CENTRUM SILVER ADULT 50+ PO  Take by mouth daily.     chlorpheniramine 4 MG tablet  Commonly known as:  CHLOR-TRIMETON  Take 1 tablet (4 mg total) by mouth 2 (two) times daily as needed for allergies or rhinitis.     clopidogrel 75 MG tablet  Commonly known as:  PLAVIX  Take 75 mg by mouth at bedtime.     CRESTOR 20 MG tablet  Generic drug:  rosuvastatin  Take 20 mg by mouth at bedtime.     cyanocobalamin 100 MCG  tablet  Take 1 tablet (100 mcg total) by mouth daily.     digoxin 0.125 MG tablet  Commonly known as:  LANOXIN  Take 0.125 mg by mouth daily.     furosemide 20 MG tablet  Commonly known as:  LASIX     gabapentin 400 MG capsule  Commonly known as:  NEURONTIN     mirabegron ER 25 MG Tb24 tablet  Commonly known as:  MYRBETRIQ  Take 1 tablet (25 mg total) by mouth daily.     PHAZYME 125 MG chewable tablet  Generic drug:  simethicone  Chew 125 mg by mouth 2 (two) times daily.     PROAIR HFA 108 (90 Base) MCG/ACT inhaler  Generic drug:  albuterol  Inhale into the lungs every 6 (six) hours as needed for wheezing or shortness of breath.      traMADol 50 MG tablet  Commonly known as:  ULTRAM        Allergies: No Known Allergies  Family History: Family History  Problem Relation Age of Onset  . Stroke Father   . Cancer Sister   . Heart attack Brother   . Osteoarthritis Mother   . Prostate cancer Neg Hx     Social History:  reports that he has never smoked. He does not have any smokeless tobacco history on file. He reports that he does not drink alcohol or use illicit drugs.  Physical Exam: BP 122/69 mmHg  Pulse 74  Resp 16  Ht 6\' 1"  (1.854 m)  Wt 168 lb (76.204 kg)  BMI 22.17 kg/m2  Constitutional:  Alert and oriented, No acute distress. HEENT: [place] AT, moist mucus membranes.  Trachea midline, no masses. Cardiovascular: No clubbing, cyanosis, or edema. Respiratory: Normal respiratory effort, no increased work of breathing. GI: Abdomen is soft, nontender, nondistended, no abdominal masses GU: No CVA tenderness. Normal scrotum. Uncircumcised phallus with easily retractable foreskin. Skin: No rashes, bruises or suspicious lesions.  Neurologic: Grossly intact, no focal deficits, moving all 4 extremities. Psychiatric: Normal mood and affect.  Laboratory Data: Lab Results  Component Value Date   WBC 48.2* 09/03/2015   HGB 13.2 09/03/2015   HCT 41.6 09/03/2015   MCV 97.4 09/03/2015   PLT 166 09/03/2015    Lab Results  Component Value Date   CREATININE 0.76 04/07/2015     Lab Results  Component Value Date   HGBA1C 6.1* 04/03/2015    Urinalysis Results for orders placed or performed in visit on 11/24/15  CULTURE, URINE COMPREHENSIVE  Result Value Ref Range   Urine Culture, Comprehensive Final report    Result 1 Comment   Microscopic Examination  Result Value Ref Range   WBC, UA 0-5 0 -  5 /hpf   RBC, UA 0-2 0 -  2 /hpf   Epithelial Cells (non renal) None seen 0 - 10 /hpf   Bacteria, UA None seen None seen/Few  Urinalysis, Complete  Result Value Ref Range   Specific Gravity, UA 1.010 1.005 - 1.030    pH, UA 5.5 5.0 - 7.5   Color, UA Yellow Yellow   Appearance Ur Clear Clear   Leukocytes, UA Trace (A) Negative   Protein, UA Negative Negative/Trace   Glucose, UA Negative Negative   Ketones, UA Negative Negative   RBC, UA Trace (A) Negative   Bilirubin, UA Negative Negative   Urobilinogen, Ur 0.2 0.2 - 1.0 mg/dL   Nitrite, UA Negative Negative   Microscopic Examination See below:   BLADDER SCAN AMB NON-IMAGING  Result Value Ref Range   Scan Result 14       Assessment & Plan:    1. Urge incontinence  Suspect urgency and urge incontinence related to overactivity following the stroke. Failed Vesicare recently.  Doing well on Myrbetriq when taking. -Recommend continuation of Mybetriq will increase dosage to 50mg  daily, samples provided - Urinalysis, Complete  2. BPH (benign prostatic hyperplasia) As above.  Somewhat enlarged, obstructive appearing prostate on cystoscopy, minimal obstructive symptoms. Postvoid residual today minimal.  3. Microhematuria Previous cysto negative for tumors on 07/2015.  Given age and other comorbities, defer further work up including CT Urogram at this time.  RBC likely prostatic in origin or from foreskin in setting of ASA/ plavix.  May consider in future if persistent.    No Follow-up on file.  Herbert Moors, [facility] [address] [place], [place] [address] [phone]

## 2015-12-15 ENCOUNTER — Ambulatory Visit: Payer: Medicare PPO | Admitting: Neurology

## 2015-12-19 ENCOUNTER — Ambulatory Visit (INDEPENDENT_AMBULATORY_CARE_PROVIDER_SITE_OTHER): Payer: Medicare Other | Admitting: Urology

## 2015-12-19 ENCOUNTER — Encounter: Payer: Self-pay | Admitting: Urology

## 2015-12-19 VITALS — BP 108/65 | HR 81 | Ht 72.0 in | Wt 169.9 lb

## 2015-12-19 DIAGNOSIS — R3129 Other microscopic hematuria: Secondary | ICD-10-CM

## 2015-12-19 DIAGNOSIS — N3941 Urge incontinence: Secondary | ICD-10-CM

## 2015-12-19 DIAGNOSIS — N401 Enlarged prostate with lower urinary tract symptoms: Secondary | ICD-10-CM | POA: Diagnosis not present

## 2015-12-19 DIAGNOSIS — N138 Other obstructive and reflux uropathy: Secondary | ICD-10-CM

## 2015-12-19 LAB — MICROSCOPIC EXAMINATION: Epithelial Cells (non renal): NONE SEEN /hpf (ref 0–10)

## 2015-12-19 LAB — URINALYSIS, COMPLETE
Bilirubin, UA: NEGATIVE
GLUCOSE, UA: NEGATIVE
Ketones, UA: NEGATIVE
NITRITE UA: NEGATIVE
PH UA: 5 (ref 5.0–7.5)
PROTEIN UA: NEGATIVE
Specific Gravity, UA: 1.015 (ref 1.005–1.030)
Urobilinogen, Ur: 0.2 mg/dL (ref 0.2–1.0)

## 2015-12-19 LAB — BLADDER SCAN AMB NON-IMAGING: SCAN RESULT: 30

## 2015-12-19 NOTE — Progress Notes (Signed)
12/19/2015 10:56 PM   Nicholas Elliott 1922/07/18 [id number]  Referring provider: [name], MD [address] [place], [place] [address]  Chief Complaint  Patient presents with  . Follow-up    HPI: Patient is a 82 year old Caucasian male who presents today for follow-up after being placed on Myrbetriq 50 mg daily for his urge incontinence.    Patient states he found no decrease in his urge incontinence with the Myrbetriq 50 mg daily. His I PSS score today was 15/4.  His PVR was 30 mL.  His UA today noted 3-10 RBCs per prior field.  He denies dysuria, suprapubic pain or flank pain.  He has not had any recent fevers, chills, nausea or vomiting.      IPSS      12/19/15 1200       International Prostate Symptom Score   How often have you had the sensation of not emptying your bladder? More than half the time     How often have you had to urinate less than every two hours? About half the time     How often have you found you stopped and started again several times when you urinated? About half the time     How often have you found it difficult to postpone urination? Not Elliott All     How often have you had a weak urinary stream? Less than 1 in 5 times     How often have you had to strain to start urination? Not Elliott All     How many times did you typically get up Elliott night to urinate? 4 Times     Total IPSS Score 15     Quality of Life due to urinary symptoms   If you were to spend the rest of your life with your urinary condition just the way it is now how would you feel about that? Mostly Disatisfied        Score:  1-7 Mild 8-19 Moderate 20-35 Severe  PMH: Past Medical History  Diagnosis Date  . High cholesterol   . TIA (transient ischemic attack)   . Arthritis   . Glaucoma   . Migraine   . CLL (chronic lymphocytic leukemia) ([place])   . Facial droop 04/02/2015  . High cholesterol   . TIA (transient ischemic attack)   . Arthritis   . Migraine   . Stroke ([place])  04/02/2015  . CLL (chronic lymphocytic leukemia) ([place])   . Left hip pain 04/03/2015  . Forehead contusion 04/03/2015  . Left elbow contusion 04/03/2015  . Hyperglycemia 04/03/2015  . A-fib ([place]) 04/08/2014  . Carpal tunnel syndrome 01/23/2014  . DDD (degenerative disc disease), cervical 01/23/2014  . Cervical spinal stenosis 01/23/2014  . Cerebral vascular accident ([place]) 04/11/2015  . DDD (degenerative disc disease), lumbar 09/11/2014  . Heart valve disease 04/08/2014  . HLD (hyperlipidemia) 04/08/2014  . Neuritis or radiculitis due to rupture of lumbar intervertebral disc 09/11/2014  . Degenerative arthritis of lumbar spine 01/23/2014  . AF (paroxysmal atrial fibrillation) ([place]) 04/11/2015  . Nerve root pain 01/23/2014  . Episode of syncope 04/11/2015    Surgical History: Past Surgical History  Procedure Laterality Date  . Upper palate reparin    . Spine surgery      Home Medications:    Medication List       This list is accurate as of: 12/19/15 11:59 PM.  Always use your most recent med list.  aspirin EC 81 MG tablet  Take 81 mg by mouth daily.     CENTRUM SILVER ADULT 50+ PO  Take by mouth daily.     chlorpheniramine 4 MG tablet  Commonly known as:  CHLOR-TRIMETON  Take 1 tablet (4 mg total) by mouth 2 (two) times daily as needed for allergies or rhinitis.     clopidogrel 75 MG tablet  Commonly known as:  PLAVIX  Take 75 mg by mouth Elliott bedtime.     CRESTOR 20 MG tablet  Generic drug:  rosuvastatin  Take 20 mg by mouth Elliott bedtime.     cyanocobalamin 100 MCG tablet  Take 1 tablet (100 mcg total) by mouth daily.     digoxin 0.125 MG tablet  Commonly known as:  LANOXIN  Take 0.125 mg by mouth daily.     furosemide 20 MG tablet  Commonly known as:  LASIX  Reported on 12/19/2015     gabapentin 400 MG capsule  Commonly known as:  NEURONTIN     mirabegron ER 25 MG Tb24 tablet  Commonly known as:  MYRBETRIQ  Take 1 tablet (25 mg total) by mouth daily.      PHAZYME 125 MG chewable tablet  Generic drug:  simethicone  Chew 125 mg by mouth 2 (two) times daily.     PROAIR HFA 108 (90 Base) MCG/ACT inhaler  Generic drug:  albuterol  Inhale into the lungs every 6 (six) hours as needed for wheezing or shortness of breath.     traMADol 50 MG tablet  Commonly known as:  ULTRAM        Allergies: No Known Allergies  Family History: Family History  Problem Relation Age of Onset  . Stroke Father   . Cancer Sister   . Heart attack Brother   . Osteoarthritis Mother   . Prostate cancer Neg Hx     Social History:  reports that he has never smoked. He does not have any smokeless tobacco history on file. He reports that he does not drink alcohol or use illicit drugs.  ROS: UROLOGY Frequent Urination?: No Hard to postpone urination?: No Burning/pain with urination?: No Get up Elliott night to urinate?: Yes Leakage of urine?: No Urine stream starts and stops?: No Trouble starting stream?: No Do you have to strain to urinate?: No Blood in urine?: No Urinary tract infection?: No Sexually transmitted disease?: No Injury to kidneys or bladder?: No Painful intercourse?: No Weak stream?: No Erection problems?: No Penile pain?: No  Gastrointestinal Nausea?: No Vomiting?: No Indigestion/heartburn?: No Diarrhea?: No Constipation?: No  Constitutional Fever: No Night sweats?: No Weight loss?: No Fatigue?: No  Skin Skin rash/lesions?: No Itching?: No  Eyes Blurred vision?: No Double vision?: Yes  Ears/Nose/Throat Sore throat?: No Sinus problems?: No  Hematologic/Lymphatic Swollen glands?: No Easy bruising?: Yes  Cardiovascular Leg swelling?: Yes Chest pain?: No  Respiratory Cough?: No Shortness of breath?: Yes  Endocrine Excessive thirst?: No  Musculoskeletal Back pain?: Yes Joint pain?: No  Neurological Headaches?: No Dizziness?: No  Psychologic Depression?: No Anxiety?: No  Physical Exam: BP 108/65 mmHg   Pulse 81  Ht 6' (1.829 m)  Wt 169 lb 14.4 oz (77.066 kg)  BMI 23.04 kg/m2  Constitutional: Well nourished. Alert and oriented, No acute distress. HEENT: Nicholas Elliott, moist mucus membranes. Trachea midline, no masses. Cardiovascular: No clubbing, cyanosis, or edema. Respiratory: Normal respiratory effort, no increased work of breathing. Skin: No rashes, bruises or suspicious lesions. Lymph: No cervical or inguinal adenopathy.  Neurologic: Grossly intact, no focal deficits, moving all 4 extremities. Psychiatric: Normal mood and affect.  Laboratory Data: Lab Results  Component Value Date   WBC WILL FOLLOW 12/19/2015   HGB 13.2 09/03/2015   HCT WILL FOLLOW 12/19/2015   MCV WILL FOLLOW 12/19/2015   PLT WILL FOLLOW 12/19/2015    Lab Results  Component Value Date   CREATININE 0.85 12/19/2015    Lab Results  Component Value Date   HGBA1C 6.1* 04/03/2015    Lab Results  Component Value Date   TSH 2.11 11/16/2012       Component Value Date/Time   CHOL 134 04/03/2015 0012   HDL 48 04/03/2015 0012   CHOLHDL 2.8 04/03/2015 0012   VLDL 6 04/03/2015 0012   LDLCALC 80 04/03/2015 0012    Lab Results  Component Value Date   AST 44* 04/02/2015   Lab Results  Component Value Date   ALT 22 04/02/2015    Urinalysis Results for orders placed or performed in visit on 12/19/15  Microscopic Examination  Result Value Ref Range   WBC, UA 0-5 0 -  5 /hpf   RBC, UA 3-10 (A) 0 -  2 /hpf   Epithelial Cells (non renal) None seen 0 - 10 /hpf   Bacteria, UA Few (A) None seen/Few  Urinalysis, Complete  Result Value Ref Range   Specific Gravity, UA 1.015 1.005 - 1.030   pH, UA 5.0 5.0 - 7.5   Color, UA Yellow Yellow   Appearance Ur Clear Clear   Leukocytes, UA Trace (A) Negative   Protein, UA Negative Negative/Trace   Glucose, UA Negative Negative   Ketones, UA Negative Negative   RBC, UA 2+ (A) Negative   Bilirubin, UA Negative Negative   Urobilinogen, Ur 0.2 0.2 - 1.0 mg/dL    Nitrite, UA Negative Negative   Microscopic Examination See below:      Pertinent Imaging: Results for Nicholas, Elliott (MRN [id number]) as of 12/21/2015 22:47  Ref. Range 12/19/2015 12:03  Scan Result Unknown 30    Assessment & Plan:    1. Microscopic hematuria:   Explained to patient the causes of blood in the urine are as follows: stones, BPH, UTI's, damage to the urinary tract and/or cancer.  It is explained to the patient that they will be scheduled for a CT Urogram with contrast material and that in rare instances, an allergic reaction can be serious and even life threatening with the injection of contrast material.   The patient denies any allergies to contrast, iodine and/or seafood and is not taking metformin.  - BLADDER SCAN AMB NON-IMAGING - Urinalysis, Complete  2. Urge incontinence:   Suspect urgency and urge incontinence related to overactivity following the stroke. Failed Vesicare and Myrbetriq.    - BLADDER SCAN AMB NON-IMAGING  3. BPH with LUTS:   IPSS 15/4.  Somewhat enlarged, obstructive appearing prostate on cystoscopy on 07/2015, minimal obstructive symptoms. Postvoid residual today minimal.   Return for return for CT Urogram report.  These notes generated with voice recognition software. I apologize for typographical errors.  Zara Council, [facility] [address], [place],  [address] [phone]

## 2015-12-20 LAB — SPECIMEN STATUS

## 2015-12-21 DIAGNOSIS — R3129 Other microscopic hematuria: Secondary | ICD-10-CM | POA: Insufficient documentation

## 2015-12-21 DIAGNOSIS — N3941 Urge incontinence: Secondary | ICD-10-CM | POA: Insufficient documentation

## 2015-12-21 DIAGNOSIS — N138 Other obstructive and reflux uropathy: Secondary | ICD-10-CM | POA: Insufficient documentation

## 2015-12-21 DIAGNOSIS — N401 Enlarged prostate with lower urinary tract symptoms: Secondary | ICD-10-CM

## 2015-12-23 LAB — BUN+CREAT
BUN / CREAT RATIO: 25 — AB (ref 10–22)
BUN: 21 mg/dL (ref 10–36)
Creatinine, Ser: 0.85 mg/dL (ref 0.76–1.27)
GFR, EST AFRICAN AMERICAN: 88 mL/min/{1.73_m2} (ref >59)
GFR, EST NON AFRICAN AMERICAN: 76 mL/min/{1.73_m2} (ref >59)

## 2015-12-29 DIAGNOSIS — I70219 Atherosclerosis of native arteries of extremities with intermittent claudication, unspecified extremity: Secondary | ICD-10-CM | POA: Insufficient documentation

## 2016-01-02 ENCOUNTER — Ambulatory Visit
Admission: RE | Admit: 2016-01-02 | Discharge: 2016-01-02 | Disposition: A | Discharge status: Home / Self Care | Payer: Medicare Other | Source: Ambulatory Visit | Attending: Urology | Admitting: Urology

## 2016-01-02 DIAGNOSIS — R918 Other nonspecific abnormal finding of lung field: Secondary | ICD-10-CM | POA: Diagnosis not present

## 2016-01-02 DIAGNOSIS — R3129 Other microscopic hematuria: Secondary | ICD-10-CM | POA: Insufficient documentation

## 2016-01-02 DIAGNOSIS — I2510 Atherosclerotic heart disease of native coronary artery without angina pectoris: Secondary | ICD-10-CM | POA: Diagnosis not present

## 2016-01-02 MED ORDER — IOPAMIDOL (ISOVUE-370) INJECTION 76%
100.0000 mL | Freq: Once | INTRAVENOUS | Status: AC | PRN
  Administered 2016-01-02: 100 mL via INTRAVENOUS

## 2016-01-02 MED ORDER — IOPAMIDOL (ISOVUE-300) INJECTION 61%: 100.0000 mL | Freq: Once | INTRAVENOUS | Status: DC | PRN

## 2016-01-07 ENCOUNTER — Ambulatory Visit (INDEPENDENT_AMBULATORY_CARE_PROVIDER_SITE_OTHER): Payer: Medicare Other | Admitting: Urology

## 2016-01-07 ENCOUNTER — Encounter: Payer: Self-pay | Admitting: Urology

## 2016-01-07 VITALS — BP 146/68 | HR 88 | Ht 72.0 in | Wt 171.9 lb

## 2016-01-07 DIAGNOSIS — N3941 Urge incontinence: Secondary | ICD-10-CM

## 2016-01-07 DIAGNOSIS — R3129 Other microscopic hematuria: Secondary | ICD-10-CM | POA: Diagnosis not present

## 2016-01-07 DIAGNOSIS — N401 Enlarged prostate with lower urinary tract symptoms: Secondary | ICD-10-CM

## 2016-01-07 DIAGNOSIS — N138 Other obstructive and reflux uropathy: Secondary | ICD-10-CM

## 2016-01-07 NOTE — Progress Notes (Signed)
10:34 AM   Nicholas Elliott 1922/01/31 [id number]  Referring provider: [name], MD [address] [place], [place] [address]  Chief Complaint  Patient presents with  . Results    CT    HPI: Patient is a 82 year old Caucasian male who presents today for follow-up after undergoing a CT Urogram for microscopic hematuria.    Microscopic hematuria CT Urogram completed on 01/02/2016 noted no findings to explain his microscopic hematuria.  He has not seen gross hematuria.  I have reviewed the films with the patient.    Urgency Patient states he is concerned about his urinary urgency.  He has been on an anticholinergic and Myrbetriq.  He states that neither medication has helped.  He states at this time he is voiding three times during the day and once at night.  His PVR's have been minimal.        IPSS      12/19/15 1200       International Prostate Symptom Score   How often have you had the sensation of not emptying your bladder? More than half the time     How often have you had to urinate less than every two hours? About half the time     How often have you found you stopped and started again several times when you urinated? About half the time     How often have you found it difficult to postpone urination? Not at All     How often have you had a weak urinary stream? Less than 1 in 5 times     How often have you had to strain to start urination? Not at All     How many times did you typically get up at night to urinate? 4 Times     Total IPSS Score 15     Quality of Life due to urinary symptoms   If you were to spend the rest of your life with your urinary condition just the way it is now how would you feel about that? Mostly Disatisfied        Score:  1-7 Mild 8-19 Moderate 20-35 Severe  PMH: Past Medical History  Diagnosis Date  . High cholesterol   . TIA (transient ischemic attack)   . Arthritis   . Glaucoma   . Migraine   . CLL (chronic lymphocytic  leukemia) ([place])   . Facial droop 04/02/2015  . High cholesterol   . TIA (transient ischemic attack)   . Arthritis   . Migraine   . Stroke ([place]) 04/02/2015  . CLL (chronic lymphocytic leukemia) ([place])   . Left hip pain 04/03/2015  . Forehead contusion 04/03/2015  . Left elbow contusion 04/03/2015  . Hyperglycemia 04/03/2015  . A-fib ([place]) 04/08/2014  . Carpal tunnel syndrome 01/23/2014  . DDD (degenerative disc disease), cervical 01/23/2014  . Cervical spinal stenosis 01/23/2014  . Cerebral vascular accident ([place]) 04/11/2015  . DDD (degenerative disc disease), lumbar 09/11/2014  . Heart valve disease 04/08/2014  . HLD (hyperlipidemia) 04/08/2014  . Neuritis or radiculitis due to rupture of lumbar intervertebral disc 09/11/2014  . Degenerative arthritis of lumbar spine 01/23/2014  . AF (paroxysmal atrial fibrillation) ([place]) 04/11/2015  . Nerve root pain 01/23/2014  . Episode of syncope 04/11/2015    Surgical History: Past Surgical History  Procedure Laterality Date  . Upper palate reparin    . Spine surgery      Home Medications:    Medication List  This list is accurate as of: 01/07/16 10:34 AM.  Always use your most recent med list.               aspirin EC 81 MG tablet  Take 81 mg by mouth daily. Reported on 01/07/2016     CENTRUM SILVER ADULT 50+ PO  Take by mouth daily.     chlorpheniramine 4 MG tablet  Commonly known as:  CHLOR-TRIMETON  Take 1 tablet (4 mg total) by mouth 2 (two) times daily as needed for allergies or rhinitis.     clobetasol 0.05 % external solution  Commonly known as:  TEMOVATE  Reported on 01/07/2016     clopidogrel 75 MG tablet  Commonly known as:  PLAVIX  Take 75 mg by mouth at bedtime.     CRESTOR 20 MG tablet  Generic drug:  rosuvastatin  Take 20 mg by mouth at bedtime.     cyanocobalamin 100 MCG tablet  Take 1 tablet (100 mcg total) by mouth daily.     digoxin 0.125 MG tablet  Commonly known as:  LANOXIN  Take 0.125 mg by mouth daily.       furosemide 20 MG tablet  Commonly known as:  LASIX  Reported on 01/07/2016     gabapentin 400 MG capsule  Commonly known as:  NEURONTIN  Reported on 01/07/2016     mirabegron ER 25 MG Tb24 tablet  Commonly known as:  MYRBETRIQ  Take 1 tablet (25 mg total) by mouth daily.     PHAZYME 125 MG chewable tablet  Generic drug:  simethicone  Chew 125 mg by mouth 2 (two) times daily. Reported on 01/07/2016     PROAIR HFA 108 (90 Base) MCG/ACT inhaler  Generic drug:  albuterol  Inhale into the lungs every 6 (six) hours as needed for wheezing or shortness of breath.     traMADol 50 MG tablet  Commonly known as:  ULTRAM        Allergies: No Known Allergies  Family History: Family History  Problem Relation Age of Onset  . Stroke Father   . Cancer Sister   . Heart attack Brother   . Osteoarthritis Mother   . Prostate cancer Neg Hx     Social History:  reports that he has never smoked. He does not have any smokeless tobacco history on file. He reports that he does not drink alcohol or use illicit drugs.  ROS: UROLOGY Frequent Urination?: No Hard to postpone urination?: No Burning/pain with urination?: No Get up at night to urinate?: Yes Leakage of urine?: No Urine stream starts and stops?: No Trouble starting stream?: No Do you have to strain to urinate?: No Blood in urine?: No Urinary tract infection?: No Sexually transmitted disease?: No Injury to kidneys or bladder?: No Painful intercourse?: No Weak stream?: No Erection problems?: No Penile pain?: No  Gastrointestinal Nausea?: No Vomiting?: No Indigestion/heartburn?: No Diarrhea?: No Constipation?: No  Constitutional Fever: No Night sweats?: No Weight loss?: No Fatigue?: No  Skin Skin rash/lesions?: No Itching?: No  Eyes Blurred vision?: No Double vision?: Yes  Ears/Nose/Throat Sore throat?: No Sinus problems?: No  Hematologic/Lymphatic Swollen glands?: No Easy bruising?:  No  Cardiovascular Leg swelling?: Yes Chest pain?: No  Respiratory Cough?: No Shortness of breath?: Yes  Endocrine Excessive thirst?: No  Musculoskeletal Back pain?: Yes Joint pain?: No  Neurological Headaches?: No Dizziness?: No  Psychologic Depression?: No Anxiety?: No  Physical Exam: BP 146/68 mmHg  Pulse 88  Ht 6' (1.829  m)  Wt 171 lb 14.4 oz (77.973 kg)  BMI 23.31 kg/m2  Constitutional: Well nourished. Alert and oriented, No acute distress. HEENT: [place] AT, moist mucus membranes. Trachea midline, no masses. Cardiovascular: No clubbing, cyanosis, or edema. Respiratory: Normal respiratory effort, no increased work of breathing. Skin: No rashes, bruises or suspicious lesions. Lymph: No cervical or inguinal adenopathy. Neurologic: Grossly intact, no focal deficits, moving all 4 extremities. Psychiatric: Normal mood and affect.  Laboratory Data: Lab Results  Component Value Date   WBC WILL FOLLOW 12/19/2015   HGB 13.2 09/03/2015   HCT WILL FOLLOW 12/19/2015   MCV WILL FOLLOW 12/19/2015   PLT WILL FOLLOW 12/19/2015    Lab Results  Component Value Date   CREATININE 0.85 12/19/2015    Lab Results  Component Value Date   HGBA1C 6.1* 04/03/2015    Lab Results  Component Value Date   TSH 2.11 11/16/2012       Component Value Date/Time   CHOL 134 04/03/2015 0012   HDL 48 04/03/2015 0012   CHOLHDL 2.8 04/03/2015 0012   VLDL 6 04/03/2015 0012   LDLCALC 80 04/03/2015 0012    Lab Results  Component Value Date   AST 44* 04/02/2015   Lab Results  Component Value Date   ALT 22 04/02/2015    Pertinent Imaging: CLINICAL DATA: 82 year old with microscopic hematuria. New onset of urinary urgency and incontinence following stroke 9 months ago. History of chronic lymphocytic leukemia.  EXAM: CT ABDOMEN AND PELVIS WITHOUT AND WITH CONTRAST  TECHNIQUE: Multidetector CT imaging of the abdomen and pelvis was performed following the standard  protocol before and following the bolus administration of intravenous contrast. A single post-contrast imaging was performed in the renal excretory phase. There are no portal phase images.  CONTRAST: 100 ml Isovue 370.  COMPARISON: Abdominal CT 01/25/2011  FINDINGS: Lower chest: Mild emphysematous changes are present in both lung bases. There is a 10 mm noncalcified right lower lobe pulmonary nodule on image 24. There is a tiny left lower lobe nodule on image 20. Although partly obscured by breathing artifact on the prior examination, these were probably present and do not appear significantly changed. Coronary artery atherosclerosis, mitral annular and probable aortic valvular calcifications are noted.  Hepatobiliary: Hepatic evaluation is limited by the lack of portal phase images. No focal hepatic abnormalities are identified. No evidence of gallstones, gallbladder wall thickening or biliary dilatation.  Pancreas: Unremarkable. No pancreatic ductal dilatation or surrounding inflammatory changes.  Spleen: Normal in size without focal abnormality.  Adrenals/Urinary Tract: Both adrenal glands appear normal. Pre-contrast images demonstrate no renal, ureteral or bladder calculi. There is no evidence of enhancing renal mass. Delayed images result in segmental visualization of the ureters. No urothelial abnormalities are identified. The bladder appears unremarkable.  Stomach/Bowel: No evidence of bowel wall thickening, distention or surrounding inflammatory change.  Vascular/Lymphatic: There are no enlarged abdominal or pelvic lymph nodes. Moderate diffuse aortic and branch vessel atherosclerosis. Retro aortic left renal vein.  Reproductive: Stable mild enlargement of the prostate gland with central dystrophic calcifications.  Other: No evidence of abdominal wall mass or hernia.  Musculoskeletal: No acute or significant osseous findings. There is multilevel  thoracolumbar spondylosis associated with a scoliosis. There are postsurgical changes in the lower lumbar spine.  IMPRESSION: 1. No acute findings or explanation for hematuria. No evidence of urinary tract calculus, renal mass or urothelial lesion. 2. Nodules at both lung bases appear stable from 2012, consistent with benign findings. 3. Visceral organ evaluation  limited by the lack of portal phase imaging. 4. Diffuse atherosclerosis.   Electronically Signed  By: Richardean Sale M.D.  On: 01/02/2016 11:43      Assessment & Plan:    1. Microscopic hematuria:   CT Urogram did not demonstrated any etiology for the hematuria.  We will continue to monitor.  He will RTC in one year for an UA.   2. Urge incontinence:   Suspect urgency and urge incontinence related to overactivity following the stroke. Failed Vesicare and Myrbetriq.  Patient is only voiding three times daily.  I suggested he do timed voiding.  He will RTC in one year.    3. BPH with LUTS:   IPSS 15/4.  Somewhat enlarged, obstructive appearing prostate on cystoscopy on 07/2015, minimal obstructive symptoms. Postvoid residuals  have been minimal.  He will RTC in one year for IPSS score and exam.    Return in about 1 year (around 01/06/2017) for IPSS, UA and exam.  These notes generated with voice recognition software. I apologize for typographical errors.  Zara Council, [facility] [address] [place], [place] [address] [phone]

## 2016-03-03 ENCOUNTER — Inpatient Hospital Stay: Payer: Medicare Other | Attending: Oncology

## 2016-03-03 ENCOUNTER — Inpatient Hospital Stay (HOSPITAL_BASED_OUTPATIENT_CLINIC_OR_DEPARTMENT_OTHER): Payer: Medicare Other | Admitting: Oncology

## 2016-03-03 VITALS — BP 120/70 | HR 73 | Temp 97.5°F | Resp 18 | Wt 168.4 lb

## 2016-03-03 DIAGNOSIS — R161 Splenomegaly, not elsewhere classified: Secondary | ICD-10-CM | POA: Insufficient documentation

## 2016-03-03 DIAGNOSIS — D696 Thrombocytopenia, unspecified: Secondary | ICD-10-CM | POA: Diagnosis not present

## 2016-03-03 DIAGNOSIS — E7800 Pure hypercholesterolemia, unspecified: Secondary | ICD-10-CM | POA: Diagnosis not present

## 2016-03-03 DIAGNOSIS — G5600 Carpal tunnel syndrome, unspecified upper limb: Secondary | ICD-10-CM | POA: Diagnosis not present

## 2016-03-03 DIAGNOSIS — M479 Spondylosis, unspecified: Secondary | ICD-10-CM | POA: Diagnosis not present

## 2016-03-03 DIAGNOSIS — Z79899 Other long term (current) drug therapy: Secondary | ICD-10-CM | POA: Insufficient documentation

## 2016-03-03 DIAGNOSIS — I4891 Unspecified atrial fibrillation: Secondary | ICD-10-CM | POA: Insufficient documentation

## 2016-03-03 DIAGNOSIS — C9110 Chronic lymphocytic leukemia of B-cell type not having achieved remission: Secondary | ICD-10-CM | POA: Insufficient documentation

## 2016-03-03 LAB — CBC WITH DIFFERENTIAL/PLATELET
BASOS ABS: 0.2 10*3/uL — AB (ref 0–0.1)
Eosinophils Absolute: 0.4 10*3/uL (ref 0–0.7)
Eosinophils Relative: 1 %
HEMATOCRIT: 41.7 % (ref 40.0–52.0)
Hemoglobin: 13.5 g/dL (ref 13.0–18.0)
Lymphs Abs: 51.7 10*3/uL — ABNORMAL HIGH (ref 1.0–3.6)
MCH: 31.5 pg (ref 26.0–34.0)
MCHC: 32.4 g/dL (ref 32.0–36.0)
MCV: 97.2 fL (ref 80.0–100.0)
MONO ABS: 1.5 10*3/uL — AB (ref 0.2–1.0)
Monocytes Relative: 3 %
NEUTROS ABS: 4.7 10*3/uL (ref 1.4–6.5)
Neutrophils Relative %: 8 %
PLATELETS: 136 10*3/uL — AB (ref 150–440)
RBC: 4.29 MIL/uL — AB (ref 4.40–5.90)
RDW: 14.2 % (ref 11.5–14.5)
WBC: 58.4 10*3/uL — AB (ref 3.8–10.6)

## 2016-03-10 NOTE — Progress Notes (Signed)
Nicholas Elliott  Telephone:([phone]) [phone] Fax:(336) [phone]  ID: ARMOUR SILVER OB: 12/04/1921  MR#: [id number]  [id number]  Patient Care Team: [name], MD as PCP - General (Family Medicine)  CHIEF COMPLAINT:  Chief Complaint  Patient presents with  . CLL    INTERVAL HISTORY: Patient returns to clinic today for laboratory work and routine evaluation.  He continues to feel well and remains asymptomatic.  He denies any fevers, chills, or night sweats.  He denies any weight loss. He denies any chest pain or shortness of breath.  He has a good appetite and denies any nausea, vomiting, constipation, or diarrhea.  He has had no recent fevers or illnesses.  He has no urinary complaints.  Patient offers no specific complaints today.  REVIEW OF SYSTEMS:   Review of Systems  Constitutional: Negative for fever, chills, weight loss and malaise/fatigue.  Respiratory: Negative.  Negative for cough and shortness of breath.   Cardiovascular: Negative.  Negative for chest pain.  Gastrointestinal: Negative.  Negative for abdominal pain.  Musculoskeletal: Negative.   Neurological: Negative.  Negative for weakness.  Endo/Heme/Allergies: Does not bruise/bleed easily.  Psychiatric/Behavioral: Negative.     As per HPI. Otherwise, a complete review of systems is negatve.  PAST MEDICAL HISTORY: Past Medical History  Diagnosis Date  . High cholesterol   . TIA (transient ischemic attack)   . Arthritis   . Glaucoma   . Migraine   . CLL (chronic lymphocytic leukemia) ([place])   . Facial droop 04/02/2015  . High cholesterol   . TIA (transient ischemic attack)   . Arthritis   . Migraine   . Stroke ([place]) 04/02/2015  . CLL (chronic lymphocytic leukemia) ([place])   . Left hip pain 04/03/2015  . Forehead contusion 04/03/2015  . Left elbow contusion 04/03/2015  . Hyperglycemia 04/03/2015  . A-fib ([place]) 04/08/2014  . Carpal tunnel syndrome 01/23/2014  . DDD (degenerative disc disease),  cervical 01/23/2014  . Cervical spinal stenosis 01/23/2014  . Cerebral vascular accident ([place]) 04/11/2015  . DDD (degenerative disc disease), lumbar 09/11/2014  . Heart valve disease 04/08/2014  . HLD (hyperlipidemia) 04/08/2014  . Neuritis or radiculitis due to rupture of lumbar intervertebral disc 09/11/2014  . Degenerative arthritis of lumbar spine 01/23/2014  . AF (paroxysmal atrial fibrillation) ([place]) 04/11/2015  . Nerve root pain 01/23/2014  . Episode of syncope 04/11/2015    PAST SURGICAL HISTORY: Past Surgical History  Procedure Laterality Date  . Upper palate reparin    . Spine surgery      FAMILY HISTORY Family History  Problem Relation Age of Onset  . Stroke Father   . Cancer Sister   . Heart attack Brother   . Osteoarthritis Mother   . Prostate cancer Neg Hx        ADVANCED DIRECTIVES:    HEALTH MAINTENANCE: Social History  Substance Use Topics  . Smoking status: Never Smoker   . Smokeless tobacco: Not on file  . Alcohol Use: No     Colonoscopy:  PAP:  Bone density:  Lipid panel:  No Known Allergies  Current Outpatient Prescriptions  Medication Sig Dispense Refill  . aspirin EC 81 MG tablet Take 81 mg by mouth daily. Reported on 01/07/2016    . clobetasol (TEMOVATE) 0.05 % external solution Reported on 01/07/2016    . clopidogrel (PLAVIX) 75 MG tablet Take 75 mg by mouth at bedtime.     . digoxin (LANOXIN) 0.125 MG tablet Take 0.125 mg by mouth daily.     Marland Kitchen  furosemide (LASIX) 20 MG tablet Reported on 01/07/2016    . gabapentin (NEURONTIN) 400 MG capsule 600 mg. Reported on 01/07/2016    . mirabegron ER (MYRBETRIQ) 25 MG TB24 tablet Take 1 tablet (25 mg total) by mouth daily. 30 tablet 11  . Misc Natural Products (OSTEO BI-FLEX JOINT SHIELD PO) Take by mouth.    . Multiple Vitamins-Minerals (CENTRUM SILVER ADULT 50+ PO) Take by mouth daily.    . rosuvastatin (CRESTOR) 20 MG tablet Take 20 mg by mouth at bedtime.    . simethicone (PHAZYME) 125 MG chewable tablet  Chew 125 mg by mouth 2 (two) times daily. Reported on 01/07/2016    . traMADol (ULTRAM) 50 MG tablet     . vitamin B-12 100 MCG tablet Take 1 tablet (100 mcg total) by mouth daily.    Marland Kitchen albuterol (PROAIR HFA) 108 (90 BASE) MCG/ACT inhaler Inhale into the lungs every 6 (six) hours as needed for wheezing or shortness of breath. Reported on 03/03/2016    . chlorpheniramine (CHLOR-TRIMETON) 4 MG tablet Take 1 tablet (4 mg total) by mouth 2 (two) times daily as needed for allergies or rhinitis. (Patient not taking: Reported on 03/03/2016) 30 tablet 0   No current facility-administered medications for this visit.    OBJECTIVE: Filed Vitals:   03/03/16 1040  BP: 120/70  Pulse: 73  Temp: 97.5 F (36.4 C)  Resp: 18     Body mass index is 22.84 kg/(m^2).    ECOG FS:0 - Asymptomatic  General: Well-developed, well-nourished, no acute distress. Eyes: anicteric sclera. Lungs: Clear to auscultation bilaterally. Heart: Regular rate and rhythm. No rubs, murmurs, or gallops. Abdomen: Soft, nontender, nondistended. No organomegaly noted, normoactive bowel sounds. Musculoskeletal: No edema, cyanosis, or clubbing. Neuro: Alert, answering all questions appropriately. Cranial nerves grossly intact. Skin: No rashes or petechiae noted. Psych: Normal affect.   LAB RESULTS:  Lab Results  Component Value Date   NA 143 04/07/2015   K 4.6 04/07/2015   CL 109 04/07/2015   CO2 28 04/07/2015   GLUCOSE 117* 04/07/2015   BUN 21 12/19/2015   CREATININE 0.85 12/19/2015   CALCIUM 8.8* 04/07/2015   PROT 6.2* 04/02/2015   ALBUMIN 3.9 04/02/2015   AST 44* 04/02/2015   ALT 22 04/02/2015   ALKPHOS 48 04/02/2015   BILITOT 0.6 04/02/2015   GFRNONAA 76 12/19/2015   GFRAA 88 12/19/2015    Lab Results  Component Value Date   WBC 58.4* 03/03/2016   NEUTROABS 4.7 03/03/2016   HGB 13.5 03/03/2016   HCT 41.7 03/03/2016   MCV 97.2 03/03/2016   PLT 136* 03/03/2016     STUDIES: No results found.  ASSESSMENT:  Rai stage 0 CLL, Zap 70 negative.  PLAN:    1.  CLL: Confirmed by flow cytometry.  Patient's white count Has slightly trended up to 58.4.  No intervention is required.  Patient does not require treatment at this time.  Previously, CT of the chest, abdomen and pelvis only revealed mild splenomegaly.  This does not need to be repeated unless there is suspicion of progression. Return to clinic in 3 months for repeat laboratory work and then in 6 months for laboratory work and further evaluation.  2. Thrombocytopenia: Decreased, but relatively stable. Likely secondary to underlying CLL. Patient's mild splenomegaly seen on CT scan may also be contributing.  Monitor.  Patient expressed understanding and was in agreement with this plan. He also understands that He can call clinic at any time with any questions,  concerns, or complaints.    Nicholas Huger, MD   03/10/2016 1:24 PM

## 2016-06-03 ENCOUNTER — Other Ambulatory Visit: Payer: Self-pay

## 2016-06-03 ENCOUNTER — Inpatient Hospital Stay: Payer: Medicare Other | Attending: Oncology

## 2016-06-03 DIAGNOSIS — Z79899 Other long term (current) drug therapy: Secondary | ICD-10-CM | POA: Insufficient documentation

## 2016-06-03 DIAGNOSIS — D696 Thrombocytopenia, unspecified: Secondary | ICD-10-CM | POA: Diagnosis not present

## 2016-06-03 DIAGNOSIS — M479 Spondylosis, unspecified: Secondary | ICD-10-CM | POA: Diagnosis not present

## 2016-06-03 DIAGNOSIS — R161 Splenomegaly, not elsewhere classified: Secondary | ICD-10-CM | POA: Diagnosis not present

## 2016-06-03 DIAGNOSIS — E7800 Pure hypercholesterolemia, unspecified: Secondary | ICD-10-CM | POA: Insufficient documentation

## 2016-06-03 DIAGNOSIS — C9110 Chronic lymphocytic leukemia of B-cell type not having achieved remission: Secondary | ICD-10-CM

## 2016-06-03 DIAGNOSIS — I4891 Unspecified atrial fibrillation: Secondary | ICD-10-CM | POA: Diagnosis not present

## 2016-06-03 DIAGNOSIS — G5600 Carpal tunnel syndrome, unspecified upper limb: Secondary | ICD-10-CM | POA: Diagnosis not present

## 2016-06-03 LAB — CBC WITH DIFFERENTIAL/PLATELET
BASOS ABS: 0.1 10*3/uL (ref 0–0.1)
EOS ABS: 0.3 10*3/uL (ref 0–0.7)
Eosinophils Relative: 1 %
HEMATOCRIT: 41 % (ref 40.0–52.0)
HEMOGLOBIN: 13.3 g/dL (ref 13.0–18.0)
Lymphocytes Relative: 89 %
Lymphs Abs: 48.8 10*3/uL — ABNORMAL HIGH (ref 1.0–3.6)
MCH: 31.7 pg (ref 26.0–34.0)
MCHC: 32.5 g/dL (ref 32.0–36.0)
MCV: 97.5 fL (ref 80.0–100.0)
Monocytes Absolute: 1.1 10*3/uL — ABNORMAL HIGH (ref 0.2–1.0)
NEUTROS ABS: 4.3 10*3/uL (ref 1.4–6.5)
Platelets: 142 10*3/uL — ABNORMAL LOW (ref 150–440)
RBC: 4.21 MIL/uL — AB (ref 4.40–5.90)
RDW: 14.8 % — ABNORMAL HIGH (ref 11.5–14.5)
WBC: 54.6 10*3/uL — AB (ref 3.8–10.6)

## 2016-09-01 NOTE — Progress Notes (Signed)
[place]  [phone] [phone]  ID: Nicholas Elliott OB: [birth year]  MR#: [id number]  [id number]  Patient Care Team: [name], MD as PCP - General (Family Medicine)  CHIEF COMPLAINT: CLL  INTERVAL HISTORY: Patient returns to clinic today for laboratory work and routine evaluation.  He continues to feel well and remains asymptomatic. He recently saw his PCP for a sinus infection which is now better. He denies any fevers, chills, or night sweats. He denies any weight loss. He denies any chest pain or shortness of breath.  He has a good appetite and denies any nausea, vomiting, constipation, or diarrhea.  He has had no recent fevers or illnesses.  He has no urinary complaints.  Patient offers no specific complaints today.  REVIEW OF SYSTEMS:   Review of Systems  Constitutional: Negative for chills, fever, malaise/fatigue and weight loss.  Respiratory: Negative.  Negative for cough and shortness of breath.   Cardiovascular: Negative.  Negative for chest pain and leg swelling.  Gastrointestinal: Negative.  Negative for abdominal pain.  Musculoskeletal: Negative.   Neurological: Positive for speech change. Negative for sensory change and weakness.  Endo/Heme/Allergies: Does not bruise/bleed easily.  Psychiatric/Behavioral: Negative.  The patient is not nervous/anxious.     As per HPI. Otherwise, a complete review of systems is negative.  PAST MEDICAL HISTORY: Past Medical History:  Diagnosis Date  . A-fib ([place]) 04/08/2014  . AF (paroxysmal atrial fibrillation) ([place]) 04/11/2015  . Arthritis   . Arthritis   . Carpal tunnel syndrome 01/23/2014  . Cerebral vascular accident ([place]) 04/11/2015  . Cervical spinal stenosis 01/23/2014  . CLL (chronic lymphocytic leukemia) ([place])   . CLL (chronic lymphocytic leukemia) ([place])   . DDD (degenerative disc disease), cervical 01/23/2014  . DDD (degenerative disc disease), lumbar 09/11/2014  . Degenerative  arthritis of lumbar spine 01/23/2014  . Episode of syncope 04/11/2015  . Facial droop 04/02/2015  . Forehead contusion 04/03/2015  . Glaucoma   . Heart valve disease 04/08/2014  . High cholesterol   . High cholesterol   . HLD (hyperlipidemia) 04/08/2014  . Hyperglycemia 04/03/2015  . Left elbow contusion 04/03/2015  . Left hip pain 04/03/2015  . Migraine   . Migraine   . Nerve root pain 01/23/2014  . Neuritis or radiculitis due to rupture of lumbar intervertebral disc 09/11/2014  . Stroke ([place]) 04/02/2015  . TIA (transient ischemic attack)   . TIA (transient ischemic attack)     PAST SURGICAL HISTORY: Past Surgical History:  Procedure Laterality Date  . SPINE SURGERY    . upper palate reparin      FAMILY HISTORY Family History  Problem Relation Age of Onset  . Stroke Father   . Cancer Sister   . Heart attack Brother   . Osteoarthritis Mother   . Prostate cancer Neg Hx        ADVANCED DIRECTIVES:    HEALTH MAINTENANCE: Social History  Substance Use Topics  . Smoking status: Never Smoker  . Smokeless tobacco: Not on file  . Alcohol use No     Colonoscopy:  PAP:  Bone density:  Lipid panel:  No Known Allergies  Current Outpatient Prescriptions  Medication Sig Dispense Refill  . amoxicillin-clavulanate (AUGMENTIN) 875-125 MG tablet Take by mouth.    Marland Kitchen aspirin EC 81 MG tablet Take 81 mg by mouth daily. Reported on 01/07/2016    . clobetasol (TEMOVATE) 0.05 % external solution Reported on 01/07/2016    . clopidogrel (PLAVIX) 75  MG tablet Take 75 mg by mouth at bedtime.     . digoxin (LANOXIN) 0.125 MG tablet Take 0.125 mg by mouth daily.     Marland Kitchen gabapentin (NEURONTIN) 400 MG capsule 600 mg. Reported on 01/07/2016    . Misc Natural Products (OSTEO BI-FLEX JOINT SHIELD PO) Take by mouth.    . Multiple Vitamins-Minerals (CENTRUM SILVER ADULT 50+ PO) Take by mouth daily.    . rosuvastatin (CRESTOR) 20 MG tablet Take 20 mg by mouth at bedtime.    . simethicone (PHAZYME) 125 MG  chewable tablet Chew 125 mg by mouth 2 (two) times daily. Reported on 01/07/2016    . traMADol (ULTRAM) 50 MG tablet     . vitamin B-12 100 MCG tablet Take 1 tablet (100 mcg total) by mouth daily.    Marland Kitchen albuterol (PROAIR HFA) 108 (90 BASE) MCG/ACT inhaler Inhale into the lungs every 6 (six) hours as needed for wheezing or shortness of breath. Reported on 03/03/2016    . chlorpheniramine (CHLOR-TRIMETON) 4 MG tablet Take 1 tablet (4 mg total) by mouth 2 (two) times daily as needed for allergies or rhinitis. (Patient not taking: Reported on 09/02/2016) 30 tablet 0  . furosemide (LASIX) 20 MG tablet Reported on 01/07/2016    . mirabegron ER (MYRBETRIQ) 25 MG TB24 tablet Take 1 tablet (25 mg total) by mouth daily. (Patient not taking: Reported on 09/02/2016) 30 tablet 11   No current facility-administered medications for this visit.     OBJECTIVE: Vitals:   09/02/16 1142  BP: 136/77  Pulse: 84  Temp: (!) 96.6 F (35.9 C)     Body mass index is 21.96 kg/m.    ECOG FS:0 - Asymptomatic  General: Well-developed, well-nourished, no acute distress. Eyes: anicteric sclera. Lungs: Clear to auscultation bilaterally. Heart: Regular rate and rhythm. No rubs, murmurs, or gallops. Abdomen: Soft, nontender, nondistended. No organomegaly noted, normoactive bowel sounds. Musculoskeletal: No edema, cyanosis, or clubbing. Neuro: Alert, answering all questions appropriately. Cranial nerves grossly intact. Skin: No rashes or petechiae noted. Psych: Normal affect.   LAB RESULTS:  Lab Results  Component Value Date   NA 143 04/07/2015   K 4.6 04/07/2015   CL 109 04/07/2015   CO2 28 04/07/2015   GLUCOSE 117 (H) 04/07/2015   BUN 21 12/19/2015   CREATININE 0.85 12/19/2015   CALCIUM 8.8 (L) 04/07/2015   PROT 6.2 (L) 04/02/2015   ALBUMIN 3.9 04/02/2015   AST 44 (H) 04/02/2015   ALT 22 04/02/2015   ALKPHOS 48 04/02/2015   BILITOT 0.6 04/02/2015   GFRNONAA 76 12/19/2015   GFRAA 88 12/19/2015    Lab  Results  Component Value Date   WBC 68.0 (HH) 09/02/2016   NEUTROABS 5.9 09/02/2016   HGB 13.4 09/02/2016   HCT 41.8 09/02/2016   MCV 98.4 09/02/2016   PLT 151 09/02/2016     STUDIES: No results found.  ASSESSMENT: Rai stage 0 CLL, Zap 70 negative.  PLAN:    1.  CLL: Confirmed by flow cytometry.  Patient's white count has slightly trended up to 68.0  No intervention is required.  Patient does not require treatment at this time.  CT scan of the abdomen and pelvis on January 02, 2016 only revealed mild splenomegaly. Previously chest CT was negative as well. Imaging does not need to be repeated unless there is suspicion of progression. Return to clinic in 3 months for repeat laboratory work and then in 6 months for laboratory work and further evaluation.  2.  Thrombocytopenia: Patient's platelet count is within normal limits today.  Patient expressed understanding and was in agreement with this plan. He also understands that He can call clinic at any time with any questions, concerns, or complaints.   Faythe Casa, NP  Patient was seen and evaluated independently and I agree with the assessment and plan as dictated above. Although patient's white blood cell count has trended up to 68, he has not reached a doubling time. White blood cell count was 48.2 in December 2016.  Lloyd Huger, MD 09/05/16 8:34 AM

## 2016-09-02 ENCOUNTER — Other Ambulatory Visit: Payer: Self-pay | Admitting: *Deleted

## 2016-09-02 ENCOUNTER — Inpatient Hospital Stay (HOSPITAL_BASED_OUTPATIENT_CLINIC_OR_DEPARTMENT_OTHER): Payer: Medicare Other | Admitting: Oncology

## 2016-09-02 ENCOUNTER — Inpatient Hospital Stay: Payer: Medicare Other | Attending: Oncology

## 2016-09-02 VITALS — BP 136/77 | HR 84 | Temp 96.6°F | Wt 161.9 lb

## 2016-09-02 DIAGNOSIS — E7800 Pure hypercholesterolemia, unspecified: Secondary | ICD-10-CM | POA: Diagnosis not present

## 2016-09-02 DIAGNOSIS — I4891 Unspecified atrial fibrillation: Secondary | ICD-10-CM | POA: Diagnosis not present

## 2016-09-02 DIAGNOSIS — C9110 Chronic lymphocytic leukemia of B-cell type not having achieved remission: Secondary | ICD-10-CM | POA: Diagnosis not present

## 2016-09-02 DIAGNOSIS — E785 Hyperlipidemia, unspecified: Secondary | ICD-10-CM | POA: Insufficient documentation

## 2016-09-02 DIAGNOSIS — Z8673 Personal history of transient ischemic attack (TIA), and cerebral infarction without residual deficits: Secondary | ICD-10-CM | POA: Diagnosis not present

## 2016-09-02 DIAGNOSIS — R161 Splenomegaly, not elsewhere classified: Secondary | ICD-10-CM | POA: Insufficient documentation

## 2016-09-02 DIAGNOSIS — M1990 Unspecified osteoarthritis, unspecified site: Secondary | ICD-10-CM | POA: Diagnosis not present

## 2016-09-02 DIAGNOSIS — D696 Thrombocytopenia, unspecified: Secondary | ICD-10-CM | POA: Insufficient documentation

## 2016-09-02 DIAGNOSIS — Z79899 Other long term (current) drug therapy: Secondary | ICD-10-CM | POA: Diagnosis not present

## 2016-09-02 LAB — CBC WITH DIFFERENTIAL/PLATELET
BASOS PCT: 0 %
Basophils Absolute: 0.1 10*3/uL (ref 0–0.1)
EOS ABS: 0.4 10*3/uL (ref 0–0.7)
EOS PCT: 1 %
HEMATOCRIT: 41.8 % (ref 40.0–52.0)
Hemoglobin: 13.4 g/dL (ref 13.0–18.0)
Lymphocytes Relative: 87 %
Lymphs Abs: 59.8 10*3/uL — ABNORMAL HIGH (ref 1.0–3.6)
MCH: 31.6 pg (ref 26.0–34.0)
MCHC: 32.2 g/dL (ref 32.0–36.0)
MCV: 98.4 fL (ref 80.0–100.0)
MONO ABS: 1.8 10*3/uL — AB (ref 0.2–1.0)
MONOS PCT: 3 %
NEUTROS PCT: 9 %
Neutro Abs: 5.9 10*3/uL (ref 1.4–6.5)
Platelets: 151 10*3/uL (ref 150–440)
RBC: 4.24 MIL/uL — ABNORMAL LOW (ref 4.40–5.90)
RDW: 14.4 % (ref 11.5–14.5)
WBC: 68 10*3/uL — AB (ref 3.8–10.6)

## 2016-09-15 ENCOUNTER — Ambulatory Visit: Payer: Medicare PPO | Admitting: Urology

## 2016-11-30 ENCOUNTER — Other Ambulatory Visit: Payer: Self-pay

## 2016-11-30 DIAGNOSIS — C9110 Chronic lymphocytic leukemia of B-cell type not having achieved remission: Secondary | ICD-10-CM

## 2016-12-01 ENCOUNTER — Inpatient Hospital Stay: Payer: Medicare Other | Attending: Oncology

## 2016-12-01 DIAGNOSIS — D696 Thrombocytopenia, unspecified: Secondary | ICD-10-CM | POA: Diagnosis not present

## 2016-12-01 DIAGNOSIS — E785 Hyperlipidemia, unspecified: Secondary | ICD-10-CM | POA: Insufficient documentation

## 2016-12-01 DIAGNOSIS — I4891 Unspecified atrial fibrillation: Secondary | ICD-10-CM | POA: Diagnosis not present

## 2016-12-01 DIAGNOSIS — E7800 Pure hypercholesterolemia, unspecified: Secondary | ICD-10-CM | POA: Insufficient documentation

## 2016-12-01 DIAGNOSIS — Z8673 Personal history of transient ischemic attack (TIA), and cerebral infarction without residual deficits: Secondary | ICD-10-CM | POA: Diagnosis not present

## 2016-12-01 DIAGNOSIS — M1990 Unspecified osteoarthritis, unspecified site: Secondary | ICD-10-CM | POA: Diagnosis not present

## 2016-12-01 DIAGNOSIS — R161 Splenomegaly, not elsewhere classified: Secondary | ICD-10-CM | POA: Diagnosis not present

## 2016-12-01 DIAGNOSIS — C9110 Chronic lymphocytic leukemia of B-cell type not having achieved remission: Secondary | ICD-10-CM

## 2016-12-01 DIAGNOSIS — Z79899 Other long term (current) drug therapy: Secondary | ICD-10-CM | POA: Insufficient documentation

## 2016-12-01 LAB — CBC WITH DIFFERENTIAL/PLATELET
Basophils Absolute: 0.1 10*3/uL (ref 0–0.1)
Basophils Relative: 0 %
EOS PCT: 1 %
Eosinophils Absolute: 0.4 10*3/uL (ref 0–0.7)
HEMATOCRIT: 40.7 % (ref 40.0–52.0)
Hemoglobin: 13.1 g/dL (ref 13.0–18.0)
LYMPHS ABS: 67.2 10*3/uL — AB (ref 1.0–3.6)
LYMPHS PCT: 88 %
MCH: 32 pg (ref 26.0–34.0)
MCHC: 32.2 g/dL (ref 32.0–36.0)
MCV: 99.4 fL (ref 80.0–100.0)
Monocytes Absolute: 1.6 10*3/uL — ABNORMAL HIGH (ref 0.2–1.0)
Monocytes Relative: 2 %
Neutro Abs: 6.7 10*3/uL — ABNORMAL HIGH (ref 1.4–6.5)
Neutrophils Relative %: 9 %
PLATELETS: 159 10*3/uL (ref 150–440)
RBC: 4.1 MIL/uL — AB (ref 4.40–5.90)
RDW: 14.3 % (ref 11.5–14.5)
WBC: 76 10*3/uL — AB (ref 3.8–10.6)

## 2017-01-01 IMAGING — RF DG SWALLOWING FUNCTION
13 of 17 series · 13 of 24 positions shown · non-contrast
Comparison: None.

CLINICAL DATA: Dysphasia.

EXAM:
MODIFIED BARIUM SWALLOW
TECHNIQUE: Different consistencies of barium were administered orally to the
patient by the Speech Pathologist. Imaging of the pharynx was
performed in the lateral projection.
FLUOROSCOPY TIME:  Fluoroscopy Time:  2 minutes 6 seconds
Number of Acquired Images:  Cine images

[Series 1: run · 1 of 88 frames shown (1 of 13)]
[frame 14/88]
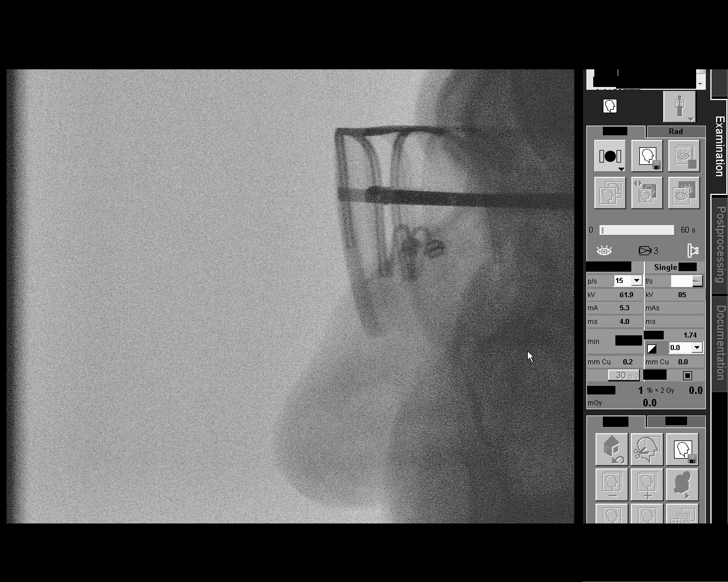

[Series 2: run · 1 of 40 frames shown (2 of 13)]
[frame 35/40]
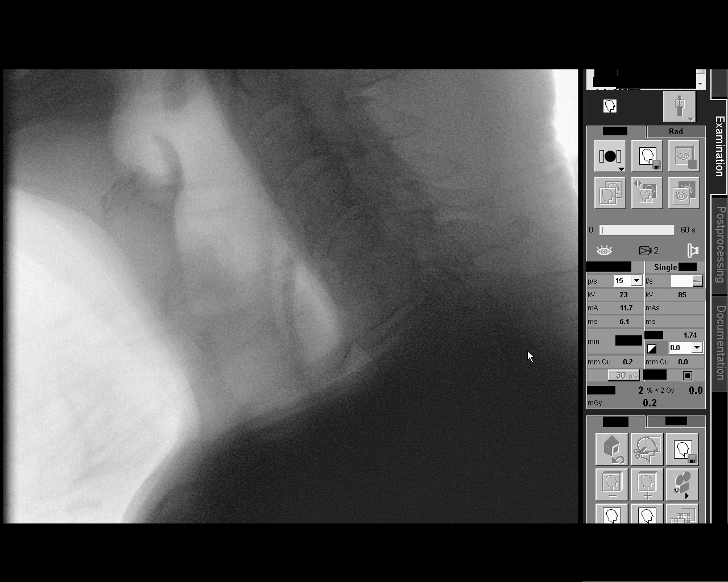

[Series 3: run · 1 of 99 frames shown (3 of 13)]
[frame 97/99]
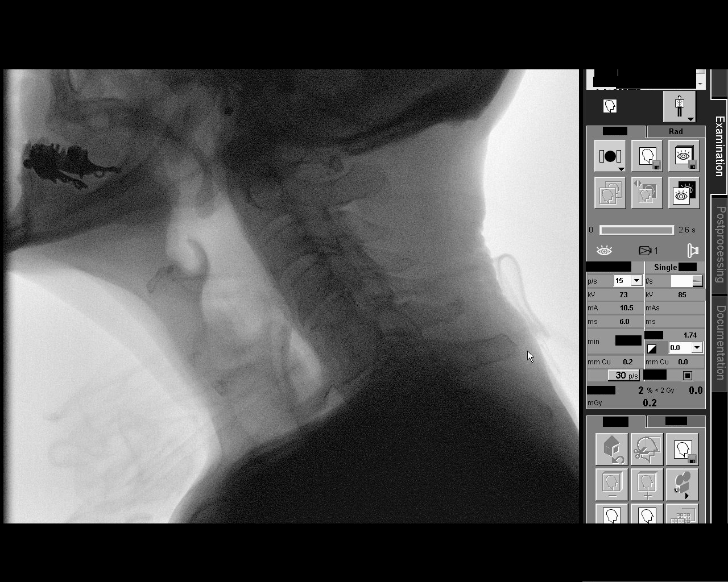

[Series 5: run · 1 of 107 frames shown (4 of 13)]
[frame 54/107]
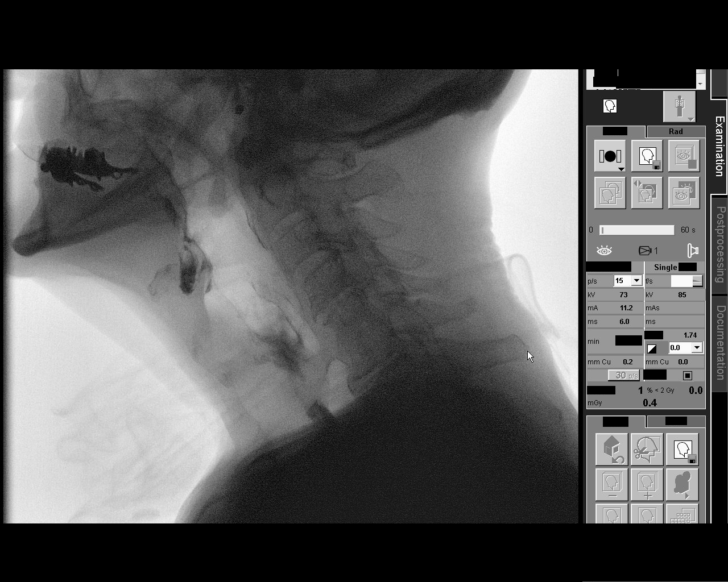

[Series 6: run · 1 of 211 frames shown (5 of 13)]
[frame 180/211]
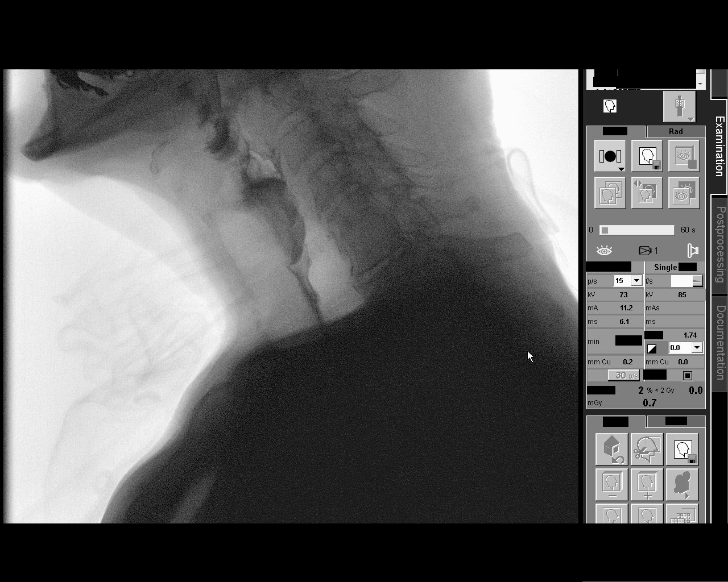

[Series 8: run · 1 of 70 frames shown (6 of 13)]
[frame 36/70]
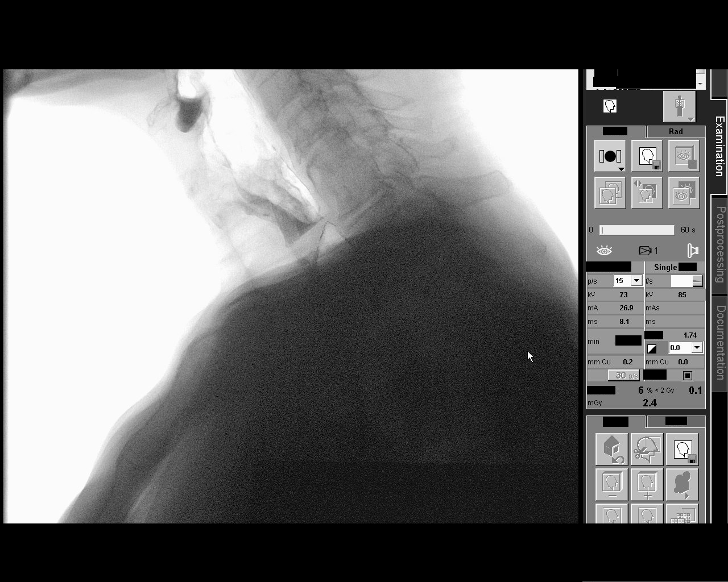

[Series 9: run · 1 of 108 frames shown (7 of 13)]
[frame 92/108]
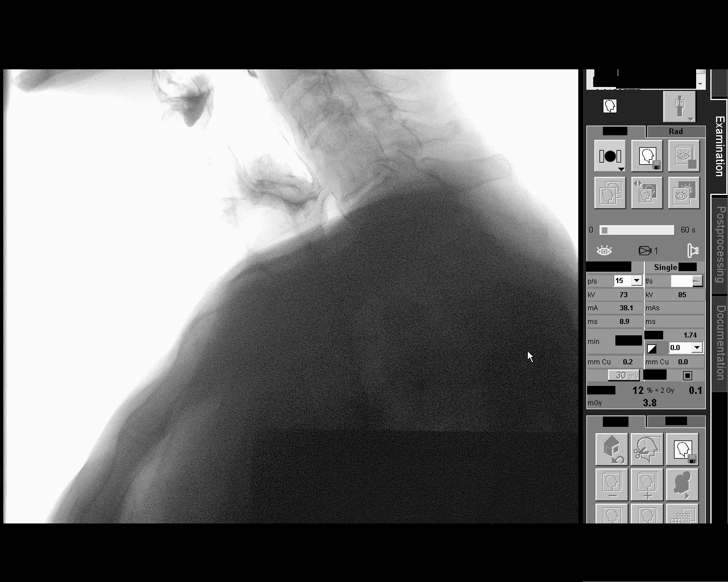

[Series 10: run · 1 of 15 frames shown (8 of 13)]
[frame 8/15]
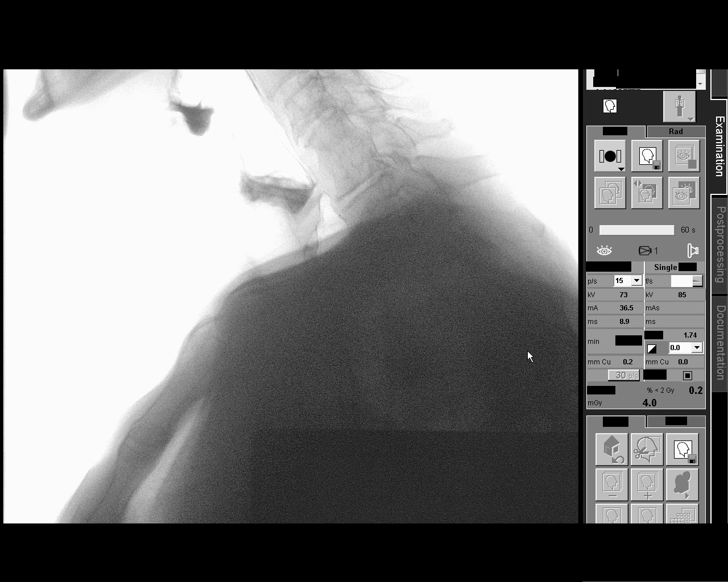

[Series 11: run · 1 of 345 frames shown (9 of 13)]
[frame 301/345]
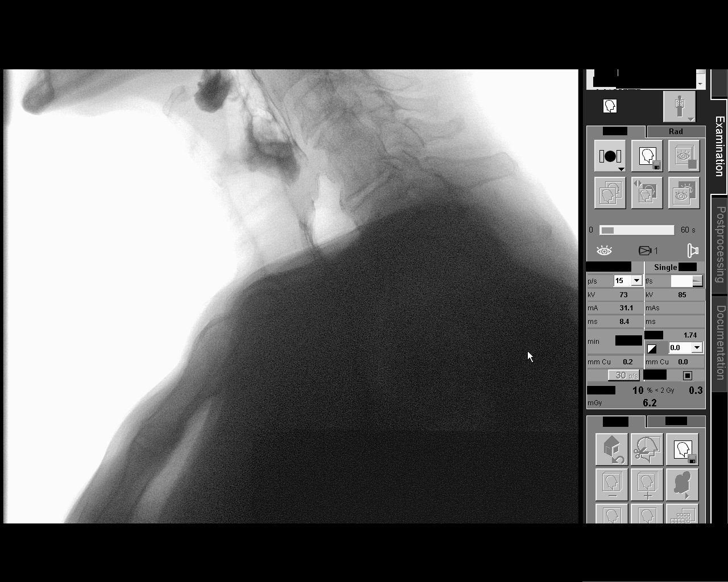

[Series 13: run · 1 of 225 frames shown (10 of 13)]
[frame 34/225]
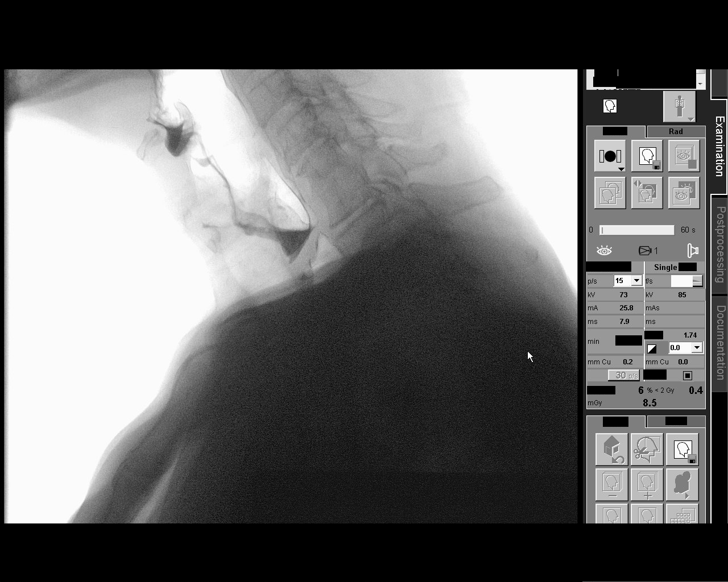

[Series 14: run · 1 of 60 frames shown (11 of 13)]
[frame 52/60]
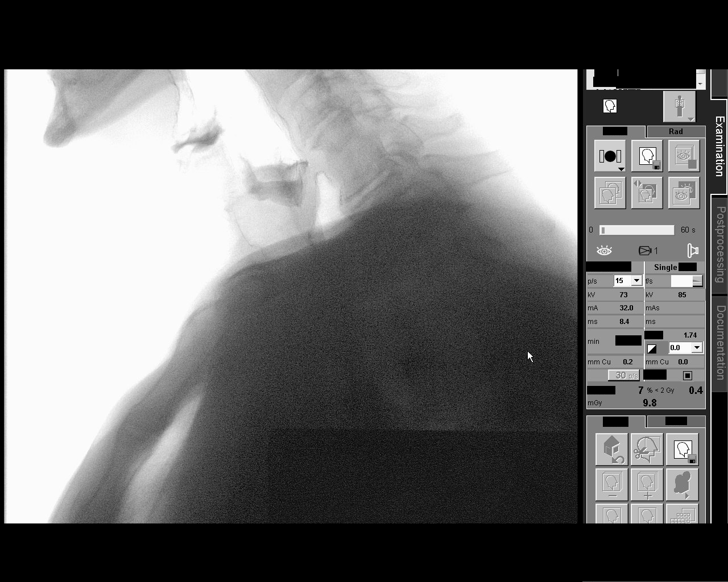

[Series 16: run · 1 of 96 frames shown (12 of 13)]
[frame 15/96]
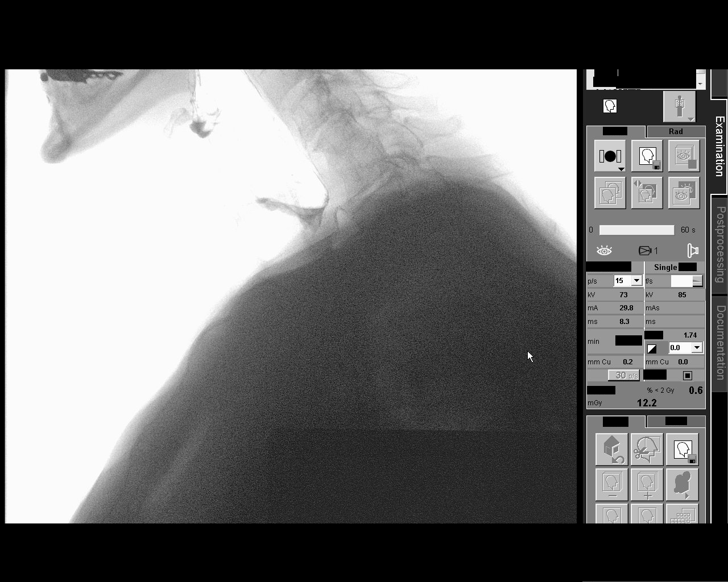

[Series 17: run · 1 of 20 frames shown (13 of 13)]
[frame 18/20]
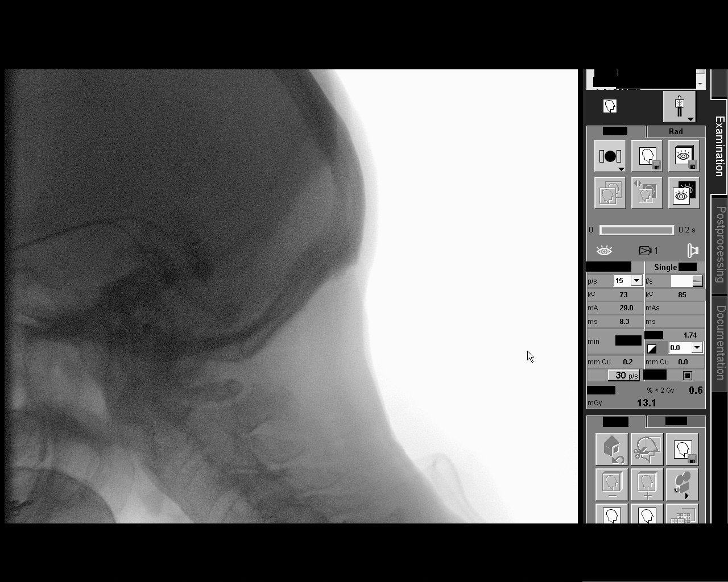

[13 of 24 positions shown; findings below may reference images not displayed]

FINDINGS: Thin liquid- within normal limits

Nectar thick liquid- mild retention.

Latanya?Tat Wai mild retention.

Latanya?Doblado with cracker- mild retention.

Very questionable mild aspiration between swallowing episodes,
possibly from the of mild retention present. Correlate with findings
about the pathology.
IMPRESSION: Very mild retention as above. Very questionable mild aspiration
cannot be excluded .

Please refer to the Speech Pathologists report for complete details
and recommendations.

## 2017-01-05 NOTE — Progress Notes (Signed)
10:36 AM   Nicholas Elliott [birth year] [id number]  Referring provider: [name], MD [phone] [place], [place] [address]  Chief Complaint  Patient presents with  . Urinary Incontinence    1 year follow up   . Hematuria    HPI: Patient is a 83 year old Caucasian male who presents today for one year follow up for history hematuria, urgency and BPH with LU TS.  Microscopic hematuria CT Urogram completed on 01/02/2016 noted no findings to explain his microscopic hematuria.  Somewhat enlarged, obstructive appearing prostate on cystoscopy on 07/2015,  He has not seen gross hematuria.  Today's UA is unremarkable.    Urgency Patient states he is concerned about his urinary urgency.  He has been on an anticholinergic and Myrbetriq.  He states that neither medication has helped.  He states at this time he is voiding three times during the day and once at night.  His PVR's have been minimal.  He states that the urgency is starting to worsen and would like to try different treatment modalities.  BPH WITH LUTS His IPSS score today is 19, which is moderate lower urinary tract symptomatology. He is unhappy with his quality life due to his urinary symptoms. His PVR is 0 mL.  His major complaint today urge incontinence.  He has had these symptoms since his strokes.  He denies any dysuria, hematuria or suprapubic pain.  He also denies any recent fevers, chills, nausea or vomiting.     IPSS    Row Name 01/06/17 1000         International Prostate Symptom Score   How often have you had the sensation of not emptying your bladder? About half the time     How often have you had to urinate less than every two hours? More than half the time     How often have you found you stopped and started again several times when you urinated? About half the time     How often have you found it difficult to postpone urination? Less than 1 in 5 times     How often have you had a weak urinary stream?  About half the time     How often have you had to strain to start urination? Less than 1 in 5 times     How many times did you typically get up at night to urinate? 4 Times     Total IPSS Score 19       Quality of Life due to urinary symptoms   If you were to spend the rest of your life with your urinary condition just the way it is now how would you feel about that? Unhappy        Score:  1-7 Mild 8-19 Moderate 20-35 Severe   PMH: Past Medical History:  Diagnosis Date  . A-fib ([place]) 04/08/2014  . AF (paroxysmal atrial fibrillation) ([place]) 04/11/2015  . Arthritis   . Arthritis   . Carpal tunnel syndrome 01/23/2014  . Cerebral vascular accident ([place]) 04/11/2015  . Cervical spinal stenosis 01/23/2014  . CLL (chronic lymphocytic leukemia) ([place])   . CLL (chronic lymphocytic leukemia) ([place])   . DDD (degenerative disc disease), cervical 01/23/2014  . DDD (degenerative disc disease), lumbar 09/11/2014  . Degenerative arthritis of lumbar spine 01/23/2014  . Episode of syncope 04/11/2015  . Facial droop 04/02/2015  . Forehead contusion 04/03/2015  . Glaucoma   . Heart valve disease 04/08/2014  . High cholesterol   .  High cholesterol   . HLD (hyperlipidemia) 04/08/2014  . Hyperglycemia 04/03/2015  . Left elbow contusion 04/03/2015  . Left hip pain 04/03/2015  . Migraine   . Migraine   . Nerve root pain 01/23/2014  . Neuritis or radiculitis due to rupture of lumbar intervertebral disc 09/11/2014  . Stroke ([place]) 04/02/2015  . TIA (transient ischemic attack)   . TIA (transient ischemic attack)     Surgical History: Past Surgical History:  Procedure Laterality Date  . HERNIA REPAIR    . SPINE SURGERY    . upper palate reparin      Home Medications:  Allergies as of 01/06/2017   No Known Allergies     Medication List       Accurate as of 01/06/17 10:36 AM. Always use your most recent med list.          aspirin EC 81 MG tablet Take 81 mg by mouth daily. Reported on 01/07/2016     CENTRUM SILVER ADULT 50+ PO Take by mouth daily.   chlorpheniramine 4 MG tablet Commonly known as:  CHLOR-TRIMETON Take 1 tablet (4 mg total) by mouth 2 (two) times daily as needed for allergies or rhinitis.   clobetasol 0.05 % external solution Commonly known as:  TEMOVATE Reported on 01/07/2016   clopidogrel 75 MG tablet Commonly known as:  PLAVIX Take 75 mg by mouth at bedtime.   CRESTOR 20 MG tablet Generic drug:  rosuvastatin Take 20 mg by mouth at bedtime.   cyanocobalamin 100 MCG tablet Take 1 tablet (100 mcg total) by mouth daily.   digoxin 0.125 MG tablet Commonly known as:  LANOXIN Take 0.125 mg by mouth daily.   fluticasone 50 MCG/ACT nasal spray Commonly known as:  FLONASE Place into the nose.   furosemide 20 MG tablet Commonly known as:  LASIX Reported on 01/07/2016   gabapentin 400 MG capsule Commonly known as:  NEURONTIN 600 mg. Reported on 01/07/2016   hydrocortisone 2.5 % ointment Apply topically.   ketoconazole 2 % shampoo Commonly known as:  NIZORAL Use 2-3x weekly on face and scalp. Let sit for 2-3 min before rinsing.   meloxicam 7.5 MG tablet Commonly known as:  MOBIC Take by mouth.   mirabegron ER 25 MG Tb24 tablet Commonly known as:  MYRBETRIQ Take 1 tablet (25 mg total) by mouth daily.   MULTI-VITAMINS Tabs Take by mouth.   OSTEO BI-FLEX JOINT SHIELD PO Take by mouth.   PHAZYME 125 MG chewable tablet Generic drug:  simethicone Chew 125 mg by mouth 2 (two) times daily. Reported on 01/07/2016   PROAIR HFA 108 (90 Base) MCG/ACT inhaler Generic drug:  albuterol Inhale into the lungs every 6 (six) hours as needed for wheezing or shortness of breath. Reported on 03/03/2016   traMADol 50 MG tablet Commonly known as:  ULTRAM   Zoster Vac Recomb Adjuvanted injection Commonly known as:  SHINGRIX Inject into the muscle. Start taking on:  02/15/2017       Allergies: No Known Allergies  Family History: Family History  Problem  Relation Age of Onset  . Stroke Father   . Heart attack Brother   . Osteoarthritis Mother   . Cancer Sister   . Prostate cancer Neg Hx   . Kidney cancer Neg Hx   . Bladder Cancer Neg Hx     Social History:  reports that he has never smoked. He has never used smokeless tobacco. He reports that he does not drink alcohol or use drugs.  ROS: UROLOGY Frequent Urination?: No Hard to postpone urination?: No Burning/pain with urination?: No Get up at night to urinate?: No Leakage of urine?: No Urine stream starts and stops?: No Trouble starting stream?: No Do you have to strain to urinate?: No Blood in urine?: No Urinary tract infection?: No Sexually transmitted disease?: No Injury to kidneys or bladder?: No Painful intercourse?: No Weak stream?: No Erection problems?: No Penile pain?: No  Gastrointestinal Nausea?: No Vomiting?: No Indigestion/heartburn?: No Diarrhea?: No Constipation?: No  Constitutional Fever: No Night sweats?: No Weight loss?: Yes Fatigue?: No  Skin Skin rash/lesions?: No Itching?: No  Eyes Blurred vision?: No Double vision?: Yes  Ears/Nose/Throat Sore throat?: No Sinus problems?: No  Hematologic/Lymphatic Swollen glands?: No Easy bruising?: Yes  Cardiovascular Leg swelling?: No Chest pain?: Yes  Respiratory Cough?: No Shortness of breath?: Yes  Endocrine Excessive thirst?: Yes  Musculoskeletal Back pain?: Yes Joint pain?: No  Neurological Headaches?: No Dizziness?: No  Psychologic Depression?: No Anxiety?: No  Physical Exam: BP 129/77   Pulse 85   Ht 6' (1.829 m)   Wt 164 lb 14.4 oz (74.8 kg)   BMI 22.36 kg/m   Constitutional: Well nourished. Alert and oriented, No acute distress. HEENT: [place] AT, moist mucus membranes. Trachea midline, no masses. Cardiovascular: No clubbing, cyanosis, or edema. Respiratory: Normal respiratory effort, no increased work of breathing. GI: Abdomen is soft, non tender, non  distended, no abdominal masses. Liver and spleen not palpable.  No hernias appreciated.  Stool sample for occult testing is not indicated.   GU: No CVA tenderness.  No bladder fullness or masses.  Patient with uncircumcised phallus.  Foreskin easily retracted  Urethral meatus is patent.  No penile discharge. No penile lesions or rashes. Scrotum without lesions, cysts, rashes and/or edema.  Testicles are located scrotally bilaterally. No masses are appreciated in the testicles. Left and right epididymis are normal. Rectal: Patient with  normal sphincter tone. Anus and perineum without scarring or rashes. No rectal masses are appreciated. Prostate is approximately 45 grams, no nodules are appreciated. Seminal vesicles are normal. Skin: No rashes, bruises or suspicious lesions. Lymph: No cervical or inguinal adenopathy. Neurologic: Grossly intact, no focal deficits, moving all 4 extremities. Psychiatric: Normal mood and affect.  Laboratory Data: Lab Results  Component Value Date   WBC 76.0 (HH) 12/01/2016   HGB 13.1 12/01/2016   HCT 40.7 12/01/2016   MCV 99.4 12/01/2016   PLT 159 12/01/2016    Lab Results  Component Value Date   CREATININE 0.85 12/19/2015    Lab Results  Component Value Date   HGBA1C 6.1 (H) 04/03/2015    Lab Results  Component Value Date   TSH 2.11 11/16/2012       Component Value Date/Time   CHOL 134 04/03/2015 0012   HDL 48 04/03/2015 0012   CHOLHDL 2.8 04/03/2015 0012   VLDL 6 04/03/2015 0012   LDLCALC 80 04/03/2015 0012    Lab Results  Component Value Date   AST 44 (H) 04/02/2015   Lab Results  Component Value Date   ALT 22 04/02/2015    Pertinent Imaging: CLINICAL DATA: 83 year old with microscopic hematuria. New onset of urinary urgency and incontinence following stroke 9 months ago. History of chronic lymphocytic leukemia.  EXAM: CT ABDOMEN AND PELVIS WITHOUT AND WITH CONTRAST  TECHNIQUE: Multidetector CT imaging of the abdomen and  pelvis was performed following the standard protocol before and following the bolus administration of intravenous contrast. A single post-contrast imaging was performed  in the renal excretory phase. There are no portal phase images.  CONTRAST: 100 ml Isovue 370.  COMPARISON: Abdominal CT 01/25/2011  FINDINGS: Lower chest: Mild emphysematous changes are present in both lung bases. There is a 10 mm noncalcified right lower lobe pulmonary nodule on image 24. There is a tiny left lower lobe nodule on image 20. Although partly obscured by breathing artifact on the prior examination, these were probably present and do not appear significantly changed. Coronary artery atherosclerosis, mitral annular and probable aortic valvular calcifications are noted.  Hepatobiliary: Hepatic evaluation is limited by the lack of portal phase images. No focal hepatic abnormalities are identified. No evidence of gallstones, gallbladder wall thickening or biliary dilatation.  Pancreas: Unremarkable. No pancreatic ductal dilatation or surrounding inflammatory changes.  Spleen: Normal in size without focal abnormality.  Adrenals/Urinary Tract: Both adrenal glands appear normal. Pre-contrast images demonstrate no renal, ureteral or bladder calculi. There is no evidence of enhancing renal mass. Delayed images result in segmental visualization of the ureters. No urothelial abnormalities are identified. The bladder appears unremarkable.  Stomach/Bowel: No evidence of bowel wall thickening, distention or surrounding inflammatory change.  Vascular/Lymphatic: There are no enlarged abdominal or pelvic lymph nodes. Moderate diffuse aortic and branch vessel atherosclerosis. Retro aortic left renal vein.  Reproductive: Stable mild enlargement of the prostate gland with central dystrophic calcifications.  Other: No evidence of abdominal wall mass or hernia.  Musculoskeletal: No acute or  significant osseous findings. There is multilevel thoracolumbar spondylosis associated with a scoliosis. There are postsurgical changes in the lower lumbar spine.  IMPRESSION: 1. No acute findings or explanation for hematuria. No evidence of urinary tract calculus, renal mass or urothelial lesion. 2. Nodules at both lung bases appear stable from 2012, consistent with benign findings. 3. Visceral organ evaluation limited by the lack of portal phase imaging. 4. Diffuse atherosclerosis.   Electronically Signed  By: Richardean Sale M.D.  On: 01/02/2016 11:43      Assessment & Plan:    1. Microscopic hematuria:   CT Urogram did not demonstrated any etiology for the hematuria.  Cystoscopy noted an enlarged prostate.  He denies any gross hematuria.  Today's UA is unremarkable.  Will continue to monitor for gross hematuria.    2. Urge incontinence:   Suspect urgency and urge incontinence related to overactivity following the stroke. Failed Vesicare and Myrbetriq.  Symptoms worsening. We discussed a referral to physical therapy and/or PTNS.  He is interested in scheduling PTNS.    3. BPH with LUTS:   IPSS 19/5.  It is worsening.  Somewhat enlarged, obstructive appearing prostate on cystoscopy on 07/2015, minimal obstructive symptoms. Postvoid residuals  have been minimal.  He will RTC in one year for IPSS score and exam.    Return for schedule PTNS.  These notes generated with voice recognition software. I apologize for typographical errors.  Zara Council, [facility] [address] [place], [place] [address] [phone]

## 2017-01-06 ENCOUNTER — Encounter: Payer: Self-pay | Admitting: Urology

## 2017-01-06 ENCOUNTER — Ambulatory Visit: Payer: Medicare Other | Admitting: Urology

## 2017-01-06 VITALS — BP 129/77 | HR 85 | Ht 72.0 in | Wt 164.9 lb

## 2017-01-06 DIAGNOSIS — R3129 Other microscopic hematuria: Secondary | ICD-10-CM | POA: Diagnosis not present

## 2017-01-06 DIAGNOSIS — N401 Enlarged prostate with lower urinary tract symptoms: Secondary | ICD-10-CM

## 2017-01-06 DIAGNOSIS — N3941 Urge incontinence: Secondary | ICD-10-CM | POA: Diagnosis not present

## 2017-01-06 DIAGNOSIS — N138 Other obstructive and reflux uropathy: Secondary | ICD-10-CM | POA: Diagnosis not present

## 2017-01-06 LAB — URINALYSIS, COMPLETE
Bilirubin, UA: NEGATIVE
GLUCOSE, UA: NEGATIVE
KETONES UA: NEGATIVE
Leukocytes, UA: NEGATIVE
Nitrite, UA: NEGATIVE
PROTEIN UA: NEGATIVE
Specific Gravity, UA: 1.015 (ref 1.005–1.030)
Urobilinogen, Ur: 0.2 mg/dL (ref 0.2–1.0)
pH, UA: 5 (ref 5.0–7.5)

## 2017-01-06 LAB — BLADDER SCAN AMB NON-IMAGING: SCAN RESULT: 0

## 2017-01-10 ENCOUNTER — Telehealth: Payer: Self-pay | Admitting: *Deleted

## 2017-01-10 NOTE — Telephone Encounter (Signed)
The next steps would be Botox or Interstim.  He would need to see Dr. Matilde Sprang to see if he is a candidate for these procedures.

## 2017-01-10 NOTE — Telephone Encounter (Signed)
Spoke with patient about PTNS procedure. I wanted to call before inquiring approval to see if patient knows the cost of PTNS. Patient insurance would require him to have to pay 40.00 each visit (12) because of specialist co pay. Patient states he will be unable to do this and wants to know what the next step will be.

## 2017-01-10 NOTE — Telephone Encounter (Signed)
Spoke with patient to let him know what [place] states next steps will be. Transferred to [place] to make appointment with Dr. Matilde Sprang. Patient ok with plan.

## 2017-01-11 ENCOUNTER — Encounter: Payer: Self-pay | Admitting: Urology

## 2017-01-11 ENCOUNTER — Ambulatory Visit (INDEPENDENT_AMBULATORY_CARE_PROVIDER_SITE_OTHER): Payer: Medicare Other | Admitting: Urology

## 2017-01-11 VITALS — BP 131/77 | HR 85 | Ht 72.0 in | Wt 161.2 lb

## 2017-01-11 DIAGNOSIS — R351 Nocturia: Secondary | ICD-10-CM | POA: Diagnosis not present

## 2017-01-11 NOTE — Progress Notes (Signed)
01/11/2017 2:29 PM   Nicholas Elliott 09-Aug-1922 [id number]  Referring provider: [name], MD [phone] [address] [place], [place] [address]  Chief Complaint  Patient presents with  . Urinary Incontinence    HPI: This is a 83 year old male who had a normal CT scan for blood in the urine. He has been on anticholinergics and the beta 3 agonist for urgency. He empties well. His primary complaint is urge incontinence and he has had strokes. He has had a distant previous cystoscopy. Percutaneous tibial nerve stimulation was discussed. Apparently the patient does not want to pay the co-pay for the percutaneous tibial nerve stimulation treatment.  Today The patient voids every 1 hour deep pending on fluid intake. He wears 1 pad a day and has urgency incontinence if he holds it too long. He gets up 6 times a night and denies ankle edema. He currently is on 25 mg of the beta 3 agonist. He remembers in the past taking tamsulosin. He cannot recall taking Lisbeth Ply and by review of notes has failed Vesicare.     PMH: Past Medical History:  Diagnosis Date  . A-fib ([place]) 04/08/2014  . AF (paroxysmal atrial fibrillation) ([place]) 04/11/2015  . Arthritis   . Arthritis   . Carpal tunnel syndrome 01/23/2014  . Cerebral vascular accident ([place]) 04/11/2015  . Cervical spinal stenosis 01/23/2014  . CLL (chronic lymphocytic leukemia) ([place])   . CLL (chronic lymphocytic leukemia) ([place])   . DDD (degenerative disc disease), cervical 01/23/2014  . DDD (degenerative disc disease), lumbar 09/11/2014  . Degenerative arthritis of lumbar spine 01/23/2014  . Episode of syncope 04/11/2015  . Facial droop 04/02/2015  . Forehead contusion 04/03/2015  . Glaucoma   . Heart valve disease 04/08/2014  . High cholesterol   . High cholesterol   . HLD (hyperlipidemia) 04/08/2014  . Hyperglycemia 04/03/2015  . Left elbow contusion 04/03/2015  . Left hip pain 04/03/2015  . Migraine   . Migraine   . Nerve root pain 01/23/2014  . Neuritis  or radiculitis due to rupture of lumbar intervertebral disc 09/11/2014  . Stroke ([place]) 04/02/2015  . TIA (transient ischemic attack)   . TIA (transient ischemic attack)     Surgical History: Past Surgical History:  Procedure Laterality Date  . HERNIA REPAIR    . SPINE SURGERY    . upper palate reparin      Home Medications:  Allergies as of 01/11/2017   No Known Allergies     Medication List       Accurate as of 01/11/17  2:29 PM. Always use your most recent med list.          aspirin EC 81 MG tablet Take 81 mg by mouth daily. Reported on 01/07/2016   CENTRUM SILVER ADULT 50+ PO Take by mouth daily.   chlorpheniramine 4 MG tablet Commonly known as:  CHLOR-TRIMETON Take 1 tablet (4 mg total) by mouth 2 (two) times daily as needed for allergies or rhinitis.   clobetasol 0.05 % external solution Commonly known as:  TEMOVATE Reported on 01/07/2016   clopidogrel 75 MG tablet Commonly known as:  PLAVIX Take 75 mg by mouth at bedtime.   CRESTOR 20 MG tablet Generic drug:  rosuvastatin Take 20 mg by mouth at bedtime.   cyanocobalamin 100 MCG tablet Take 1 tablet (100 mcg total) by mouth daily.   digoxin 0.125 MG tablet Commonly known as:  LANOXIN Take 0.125 mg by mouth daily.   fluticasone 50 MCG/ACT nasal  spray Commonly known as:  FLONASE Place into the nose.   furosemide 20 MG tablet Commonly known as:  LASIX Reported on 01/07/2016   gabapentin 400 MG capsule Commonly known as:  NEURONTIN 600 mg. Reported on 01/07/2016   hydrocortisone 2.5 % ointment Apply topically.   ketoconazole 2 % shampoo Commonly known as:  NIZORAL Use 2-3x weekly on face and scalp. Let sit for 2-3 min before rinsing.   meloxicam 7.5 MG tablet Commonly known as:  MOBIC Take by mouth.   mirabegron ER 25 MG Tb24 tablet Commonly known as:  MYRBETRIQ Take 1 tablet (25 mg total) by mouth daily.   OSTEO BI-FLEX JOINT SHIELD PO Take by mouth.   PHAZYME 125 MG chewable  tablet Generic drug:  simethicone Chew 125 mg by mouth 2 (two) times daily. Reported on 01/07/2016   PROAIR HFA 108 (90 Base) MCG/ACT inhaler Generic drug:  albuterol Inhale into the lungs every 6 (six) hours as needed for wheezing or shortness of breath. Reported on 03/03/2016   traMADol 50 MG tablet Commonly known as:  ULTRAM   Zoster Vac Recomb Adjuvanted injection Commonly known as:  SHINGRIX Inject into the muscle. Start taking on:  02/15/2017       Allergies: No Known Allergies  Family History: Family History  Problem Relation Age of Onset  . Stroke Father   . Heart attack Brother   . Osteoarthritis Mother   . Cancer Sister   . Prostate cancer Neg Hx   . Kidney cancer Neg Hx   . Bladder Cancer Neg Hx     Social History:  reports that he has never smoked. He has never used smokeless tobacco. He reports that he does not drink alcohol or use drugs.  ROS: UROLOGY Frequent Urination?: Yes Hard to postpone urination?: No Burning/pain with urination?: No Get up at night to urinate?: Yes Leakage of urine?: No Urine stream starts and stops?: No Trouble starting stream?: No Do you have to strain to urinate?: No Blood in urine?: No Urinary tract infection?: No Sexually transmitted disease?: No Injury to kidneys or bladder?: No Painful intercourse?: No Weak stream?: No Erection problems?: No Penile pain?: No  Gastrointestinal Nausea?: No Vomiting?: No Indigestion/heartburn?: No Diarrhea?: No Constipation?: No  Constitutional Fever: No Night sweats?: No Weight loss?: Yes Fatigue?: No  Skin Skin rash/lesions?: No Itching?: Yes  Eyes Blurred vision?: No Double vision?: Yes  Ears/Nose/Throat Sore throat?: No Sinus problems?: No  Hematologic/Lymphatic Swollen glands?: No Easy bruising?: Yes  Cardiovascular Leg swelling?: No Chest pain?: Yes  Respiratory Cough?: No Shortness of breath?: Yes  Endocrine Excessive thirst?:  Yes  Musculoskeletal Back pain?: No Joint pain?: No  Neurological Headaches?: Yes Dizziness?: No  Psychologic Depression?: No Anxiety?: No  Physical Exam: BP 131/77 (BP Location: Left Arm, Patient Position: Sitting, Cuff Size: Normal)   Pulse 85   Ht 6' (1.829 m)   Wt 161 lb 3.2 oz (73.1 kg)   BMI 21.86 kg/m   Constitutional:  Alert and oriented, No acute distress.  Laboratory Data: Lab Results  Component Value Date   WBC 76.0 (HH) 12/01/2016   HGB 13.1 12/01/2016   HCT 40.7 12/01/2016   MCV 99.4 12/01/2016   PLT 159 12/01/2016    Lab Results  Component Value Date   CREATININE 0.85 12/19/2015    No results found for: PSA  No results found for: TESTOSTERONE  Lab Results  Component Value Date   HGBA1C 6.1 (H) 04/03/2015    Urinalysis  Component Value Date/Time   COLORURINE YELLOW 04/02/2015 2020   APPEARANCEUR Clear 01/06/2017 0959   LABSPEC 1.023 04/02/2015 2020   LABSPEC 1.021 11/22/2013 1941   PHURINE 5.5 04/02/2015 2020   GLUCOSEU Negative 01/06/2017 0959   GLUCOSEU Negative 11/22/2013 1941   HGBUR SMALL (A) 04/02/2015 2020   BILIRUBINUR Negative 01/06/2017 0959   BILIRUBINUR Negative 11/22/2013 1941   KETONESUR 15 (A) 04/02/2015 2020   PROTEINUR Negative 01/06/2017 0959   PROTEINUR NEGATIVE 04/02/2015 2020   UROBILINOGEN 0.2 04/02/2015 2020   NITRITE Negative 01/06/2017 0959   NITRITE NEGATIVE 04/02/2015 2020   LEUKOCYTESUR Negative 01/06/2017 0959   LEUKOCYTESUR Negative 11/22/2013 1941    Pertinent Imaging: none  Assessment & Plan: The ideal treatment for this patient's mild urge incontinence fluid depended frequency and significant nocturia is percutaneous tibial nerve stimulation. In my opinion he is not a good candidate to consider Botox or InterStim. I mentioned this today. I will have him come back in 1 month on Toviaz. I do not think he is a good candidate to consider DDAVP based upon age and comorbidities.  Based upon my  schedule I'll have him follow-up with [facility] and I think percutaneous tibial nerve stimulation will need to be his choice D pending on how he does with Toviaz 4 mg (no 8 mg samples)  There are no diagnoses linked to this encounter.  No Follow-up on file.  Reece Packer, MD  [facility] [address] [place], [place] [address] [phone]

## 2017-02-16 NOTE — Progress Notes (Deleted)
8:05 AM   Lacie Draft 02-02-1922 [id number]  Referring provider: [name], MD [address] [place], [place] [address]  No chief complaint on file.   HPI: 83 yo WM who presents today for a one month follow up after a trial of Toviaz.  Patient is a 83 year old Caucasian male who presents today for one year follow up for history hematuria, urgency and BPH with LU TS.  Microscopic hematuria CT Urogram completed on 01/02/2016 noted no findings to explain his microscopic hematuria.  Somewhat enlarged, obstructive appearing prostate on cystoscopy on 07/2015,  He has not seen gross hematuria.  Today's UA is unremarkable.    Urgency Patient states he is concerned about his urinary urgency.  He has been on an anticholinergic and Myrbetriq.  He states that neither medication has helped.  He states at this time he is voiding three times during the day and once at night.  His PVR's have been minimal.  He states that the urgency is starting to worsen and would like to try different treatment modalities.  BPH WITH LUTS His IPSS score today is ***, which is *** lower urinary tract symptomatology. He is *** with his quality life due to his urinary symptoms. His PVR is *** mL.  His previous I PSS score was 19/5.  His previous PVR was 0 mL.  His major complaint today urge incontinence.  He has had these symptoms since his strokes.  He denies any dysuria, hematuria or suprapubic pain.  He also denies any recent fevers, chills, nausea or vomiting.     IPSS    Row Name 01/06/17 1000         International Prostate Symptom Score   How often have you had the sensation of not emptying your bladder? About half the time     How often have you had to urinate less than every two hours? More than half the time     How often have you found you stopped and started again several times when you urinated? About half the time     How often have you found it difficult to postpone urination? Less than 1  in 5 times     How often have you had a weak urinary stream? About half the time     How often have you had to strain to start urination? Less than 1 in 5 times     How many times did you typically get up at night to urinate? 4 Times     Total IPSS Score 19       Quality of Life due to urinary symptoms   If you were to spend the rest of your life with your urinary condition just the way it is now how would you feel about that? Unhappy        Score:  1-7 Mild 8-19 Moderate 20-35 Severe   PMH: Past Medical History:  Diagnosis Date  . A-fib ([place]) 04/08/2014  . AF (paroxysmal atrial fibrillation) ([place]) 04/11/2015  . Arthritis   . Arthritis   . Carpal tunnel syndrome 01/23/2014  . Cerebral vascular accident ([place]) 04/11/2015  . Cervical spinal stenosis 01/23/2014  . CLL (chronic lymphocytic leukemia) ([place])   . CLL (chronic lymphocytic leukemia) ([place])   . DDD (degenerative disc disease), cervical 01/23/2014  . DDD (degenerative disc disease), lumbar 09/11/2014  . Degenerative arthritis of lumbar spine 01/23/2014  . Episode of syncope 04/11/2015  . Facial droop 04/02/2015  . Forehead  contusion 04/03/2015  . Glaucoma   . Heart valve disease 04/08/2014  . High cholesterol   . High cholesterol   . HLD (hyperlipidemia) 04/08/2014  . Hyperglycemia 04/03/2015  . Left elbow contusion 04/03/2015  . Left hip pain 04/03/2015  . Migraine   . Migraine   . Nerve root pain 01/23/2014  . Neuritis or radiculitis due to rupture of lumbar intervertebral disc 09/11/2014  . Stroke ([place]) 04/02/2015  . TIA (transient ischemic attack)   . TIA (transient ischemic attack)     Surgical History: Past Surgical History:  Procedure Laterality Date  . HERNIA REPAIR    . SPINE SURGERY    . upper palate reparin      Home Medications:  Allergies as of 02/17/2017   No Known Allergies     Medication List       Accurate as of 02/16/17  8:05 AM. Always use your most recent med list.          aspirin EC 81 MG  tablet Take 81 mg by mouth daily. Reported on 01/07/2016   CENTRUM SILVER ADULT 50+ PO Take by mouth daily.   chlorpheniramine 4 MG tablet Commonly known as:  CHLOR-TRIMETON Take 1 tablet (4 mg total) by mouth 2 (two) times daily as needed for allergies or rhinitis.   clobetasol 0.05 % external solution Commonly known as:  TEMOVATE Reported on 01/07/2016   clopidogrel 75 MG tablet Commonly known as:  PLAVIX Take 75 mg by mouth at bedtime.   CRESTOR 20 MG tablet Generic drug:  rosuvastatin Take 20 mg by mouth at bedtime.   cyanocobalamin 100 MCG tablet Take 1 tablet (100 mcg total) by mouth daily.   digoxin 0.125 MG tablet Commonly known as:  LANOXIN Take 0.125 mg by mouth daily.   fluticasone 50 MCG/ACT nasal spray Commonly known as:  FLONASE Place into the nose.   furosemide 20 MG tablet Commonly known as:  LASIX Reported on 01/07/2016   gabapentin 400 MG capsule Commonly known as:  NEURONTIN 600 mg. Reported on 01/07/2016   hydrocortisone 2.5 % ointment Apply topically.   ketoconazole 2 % shampoo Commonly known as:  NIZORAL Use 2-3x weekly on face and scalp. Let sit for 2-3 min before rinsing.   meloxicam 7.5 MG tablet Commonly known as:  MOBIC Take by mouth.   mirabegron ER 25 MG Tb24 tablet Commonly known as:  MYRBETRIQ Take 1 tablet (25 mg total) by mouth daily.   OSTEO BI-FLEX JOINT SHIELD PO Take by mouth.   PHAZYME 125 MG chewable tablet Generic drug:  simethicone Chew 125 mg by mouth 2 (two) times daily. Reported on 01/07/2016   PROAIR HFA 108 (90 Base) MCG/ACT inhaler Generic drug:  albuterol Inhale into the lungs every 6 (six) hours as needed for wheezing or shortness of breath. Reported on 03/03/2016   traMADol 50 MG tablet Commonly known as:  ULTRAM   Zoster Vac Recomb Adjuvanted injection Commonly known as:  SHINGRIX Inject into the muscle.       Allergies: No Known Allergies  Family History: Family History  Problem Relation Age  of Onset  . Stroke Father   . Heart attack Brother   . Osteoarthritis Mother   . Cancer Sister   . Prostate cancer Neg Hx   . Kidney cancer Neg Hx   . Bladder Cancer Neg Hx     Social History:  reports that he has never smoked. He has never used smokeless tobacco. He reports that he  does not drink alcohol or use drugs.  ROS:                                        Physical Exam: There were no vitals taken for this visit.  Constitutional: Well nourished. Alert and oriented, No acute distress. HEENT: [place] AT, moist mucus membranes. Trachea midline, no masses. Cardiovascular: No clubbing, cyanosis, or edema. Respiratory: Normal respiratory effort, no increased work of breathing. GI: Abdomen is soft, non tender, non distended, no abdominal masses. Liver and spleen not palpable.  No hernias appreciated.  Stool sample for occult testing is not indicated.   GU: No CVA tenderness.  No bladder fullness or masses.  Patient with uncircumcised phallus.  Foreskin easily retracted  Urethral meatus is patent.  No penile discharge. No penile lesions or rashes. Scrotum without lesions, cysts, rashes and/or edema.  Testicles are located scrotally bilaterally. No masses are appreciated in the testicles. Left and right epididymis are normal. Rectal: Patient with  normal sphincter tone. Anus and perineum without scarring or rashes. No rectal masses are appreciated. Prostate is approximately 45 grams, no nodules are appreciated. Seminal vesicles are normal. Skin: No rashes, bruises or suspicious lesions. Lymph: No cervical or inguinal adenopathy. Neurologic: Grossly intact, no focal deficits, moving all 4 extremities. Psychiatric: Normal mood and affect.  Laboratory Data: Lab Results  Component Value Date   WBC 76.0 (HH) 12/01/2016   HGB 13.1 12/01/2016   HCT 40.7 12/01/2016   MCV 99.4 12/01/2016   PLT 159 12/01/2016    Lab Results  Component Value Date   CREATININE 0.85  12/19/2015    Lab Results  Component Value Date   HGBA1C 6.1 (H) 04/03/2015    Lab Results  Component Value Date   TSH 2.11 11/16/2012       Component Value Date/Time   CHOL 134 04/03/2015 0012   HDL 48 04/03/2015 0012   CHOLHDL 2.8 04/03/2015 0012   VLDL 6 04/03/2015 0012   LDLCALC 80 04/03/2015 0012    Lab Results  Component Value Date   AST 44 (H) 04/02/2015   Lab Results  Component Value Date   ALT 22 04/02/2015    Pertinent Imaging: CLINICAL DATA: 83 year old with microscopic hematuria. New onset of urinary urgency and incontinence following stroke 9 months ago. History of chronic lymphocytic leukemia.  EXAM: CT ABDOMEN AND PELVIS WITHOUT AND WITH CONTRAST  TECHNIQUE: Multidetector CT imaging of the abdomen and pelvis was performed following the standard protocol before and following the bolus administration of intravenous contrast. A single post-contrast imaging was performed in the renal excretory phase. There are no portal phase images.  CONTRAST: 100 ml Isovue 370.  COMPARISON: Abdominal CT 01/25/2011  FINDINGS: Lower chest: Mild emphysematous changes are present in both lung bases. There is a 10 mm noncalcified right lower lobe pulmonary nodule on image 24. There is a tiny left lower lobe nodule on image 20. Although partly obscured by breathing artifact on the prior examination, these were probably present and do not appear significantly changed. Coronary artery atherosclerosis, mitral annular and probable aortic valvular calcifications are noted.  Hepatobiliary: Hepatic evaluation is limited by the lack of portal phase images. No focal hepatic abnormalities are identified. No evidence of gallstones, gallbladder wall thickening or biliary dilatation.  Pancreas: Unremarkable. No pancreatic ductal dilatation or surrounding inflammatory changes.  Spleen: Normal in size without focal abnormality.  Adrenals/Urinary  Tract: Both  adrenal glands appear normal. Pre-contrast images demonstrate no renal, ureteral or bladder calculi. There is no evidence of enhancing renal mass. Delayed images result in segmental visualization of the ureters. No urothelial abnormalities are identified. The bladder appears unremarkable.  Stomach/Bowel: No evidence of bowel wall thickening, distention or surrounding inflammatory change.  Vascular/Lymphatic: There are no enlarged abdominal or pelvic lymph nodes. Moderate diffuse aortic and branch vessel atherosclerosis. Retro aortic left renal vein.  Reproductive: Stable mild enlargement of the prostate gland with central dystrophic calcifications.  Other: No evidence of abdominal wall mass or hernia.  Musculoskeletal: No acute or significant osseous findings. There is multilevel thoracolumbar spondylosis associated with a scoliosis. There are postsurgical changes in the lower lumbar spine.  IMPRESSION: 1. No acute findings or explanation for hematuria. No evidence of urinary tract calculus, renal mass or urothelial lesion. 2. Nodules at both lung bases appear stable from 2012, consistent with benign findings. 3. Visceral organ evaluation limited by the lack of portal phase imaging. 4. Diffuse atherosclerosis.   Electronically Signed  By: Richardean Sale M.D.  On: 01/02/2016 11:43      Assessment & Plan:    1. Microscopic hematuria:   CT Urogram did not demonstrated any etiology for the hematuria.  Cystoscopy noted an enlarged prostate.  He denies any gross hematuria.  Today's UA is unremarkable.  Will continue to monitor for gross hematuria.    2. Urge incontinence:   Suspect urgency and urge incontinence related to overactivity following the stroke. Failed Vesicare and Myrbetriq.  Symptoms worsening. We discussed a referral to physical therapy and/or PTNS.  He is interested in scheduling PTNS.    3. BPH with LUTS:   IPSS 19/5.  It is worsening.  Somewhat  enlarged, obstructive appearing prostate on cystoscopy on 07/2015, minimal obstructive symptoms. Postvoid residuals  have been minimal.  He will RTC in one year for IPSS score and exam.    No Follow-up on file.  These notes generated with voice recognition software. I apologize for typographical errors.  Zara Council, [facility] [address], [place], [place] [address] [phone]

## 2017-02-17 ENCOUNTER — Ambulatory Visit: Payer: Medicare Other | Admitting: Urology

## 2017-02-17 ENCOUNTER — Encounter: Payer: Self-pay | Admitting: Urology

## 2017-03-03 ENCOUNTER — Ambulatory Visit: Payer: Medicare Other | Admitting: Oncology

## 2017-03-03 ENCOUNTER — Other Ambulatory Visit: Payer: Medicare Other

## 2017-03-13 NOTE — Progress Notes (Signed)
3:41 PM   Nicholas Elliott July 03, 1922 [id number]  Referring provider: [name], MD [phone] [place], [place] [address]  Chief Complaint  Patient presents with  . Nocturia    1 month follow up     HPI: 83 yo WM who presents today for a one month follow up after a trial of Toviaz.  Microscopic hematuria CT Urogram completed on 01/02/2016 noted no findings to explain his microscopic hematuria.  Somewhat enlarged, obstructive appearing prostate on cystoscopy on 07/2015,  He has not seen gross hematuria.     Urgency Patient states he is concerned about his urinary urgency.  He has been on an anticholinergic and Myrbetriq.  He states that neither medication has helped.  He states at this time he is voiding three times during the day and once at night.  His PVR's have been minimal.  He states that the urgency is starting to worsen and would like to try different treatment modalities.  He was given a trial of Toviaz 4 mg daily.  He states the urgency occurs in the early morning for about three hours.  He is also getting up 3 to 4 times nightly.    BPH WITH LUTS His IPSS score today is 20, which is severe lower urinary tract symptomatology. He is unhappy with his quality life due to his urinary symptoms. His PVR is 31 mL.  His previous I PSS score was 19/5.  His previous PVR was 0 mL.  His major complaint today urge incontinence.  He has had these symptoms since his strokes.  He denies any dysuria, hematuria or suprapubic pain.  He also denies any recent fevers, chills, nausea or vomiting.     IPSS    Row Name 03/14/17 1500         International Prostate Symptom Score   How often have you had the sensation of not emptying your bladder? More than half the time     How often have you had to urinate less than every two hours? Less than half the time     How often have you found you stopped and started again several times when you urinated? About half the time     How often  have you found it difficult to postpone urination? About half the time     How often have you had a weak urinary stream? More than half the time     How often have you had to strain to start urination? Not at All     How many times did you typically get up at night to urinate? 4 Times     Total IPSS Score 20       Quality of Life due to urinary symptoms   If you were to spend the rest of your life with your urinary condition just the way it is now how would you feel about that? Unhappy        Score:  1-7 Mild 8-19 Moderate 20-35 Severe   PMH: Past Medical History:  Diagnosis Date  . A-fib ([place]) 04/08/2014  . AF (paroxysmal atrial fibrillation) ([place]) 04/11/2015  . Arthritis   . Arthritis   . Carpal tunnel syndrome 01/23/2014  . Cerebral vascular accident ([place]) 04/11/2015  . Cervical spinal stenosis 01/23/2014  . CLL (chronic lymphocytic leukemia) ([place])   . CLL (chronic lymphocytic leukemia) ([place])   . DDD (degenerative disc disease), cervical 01/23/2014  . DDD (degenerative disc disease), lumbar 09/11/2014  .  Degenerative arthritis of lumbar spine 01/23/2014  . Episode of syncope 04/11/2015  . Facial droop 04/02/2015  . Forehead contusion 04/03/2015  . Glaucoma   . Heart valve disease 04/08/2014  . High cholesterol   . High cholesterol   . HLD (hyperlipidemia) 04/08/2014  . Hyperglycemia 04/03/2015  . Left elbow contusion 04/03/2015  . Left hip pain 04/03/2015  . Migraine   . Migraine   . Nerve root pain 01/23/2014  . Neuritis or radiculitis due to rupture of lumbar intervertebral disc 09/11/2014  . Stroke ([place]) 04/02/2015  . TIA (transient ischemic attack)   . TIA (transient ischemic attack)     Surgical History: Past Surgical History:  Procedure Laterality Date  . HERNIA REPAIR    . SPINE SURGERY    . upper palate reparin      Home Medications:  Allergies as of 03/14/2017   No Known Allergies     Medication List       Accurate as of 03/14/17  3:41 PM. Always use your most  recent med list.          aspirin EC 81 MG tablet Take 81 mg by mouth daily. Reported on 01/07/2016   CENTRUM SILVER ADULT 50+ PO Take by mouth daily.   chlorpheniramine 4 MG tablet Commonly known as:  CHLOR-TRIMETON Take 1 tablet (4 mg total) by mouth 2 (two) times daily as needed for allergies or rhinitis.   clobetasol 0.05 % external solution Commonly known as:  TEMOVATE Reported on 01/07/2016   clopidogrel 75 MG tablet Commonly known as:  PLAVIX Take 75 mg by mouth at bedtime.   CRESTOR 20 MG tablet Generic drug:  rosuvastatin Take 20 mg by mouth at bedtime.   cyanocobalamin 100 MCG tablet Take 1 tablet (100 mcg total) by mouth daily.   digoxin 0.125 MG tablet Commonly known as:  LANOXIN Take 0.125 mg by mouth daily.   fluticasone 50 MCG/ACT nasal spray Commonly known as:  FLONASE Place into the nose.   furosemide 20 MG tablet Commonly known as:  LASIX Reported on 01/07/2016   gabapentin 400 MG capsule Commonly known as:  NEURONTIN 600 mg. Reported on 01/07/2016   hydrocortisone 2.5 % ointment Apply topically.   ketoconazole 2 % shampoo Commonly known as:  NIZORAL Use 2-3x weekly on face and scalp. Let sit for 2-3 min before rinsing.   meloxicam 7.5 MG tablet Commonly known as:  MOBIC Take by mouth.   mirabegron ER 25 MG Tb24 tablet Commonly known as:  MYRBETRIQ Take 1 tablet (25 mg total) by mouth daily.   OSTEO BI-FLEX JOINT SHIELD PO Take by mouth.   PHAZYME 125 MG chewable tablet Generic drug:  simethicone Chew 125 mg by mouth 2 (two) times daily. Reported on 01/07/2016   PROAIR HFA 108 (90 Base) MCG/ACT inhaler Generic drug:  albuterol Inhale into the lungs every 6 (six) hours as needed for wheezing or shortness of breath. Reported on 03/03/2016   traMADol 50 MG tablet Commonly known as:  ULTRAM       Allergies: No Known Allergies  Family History: Family History  Problem Relation Age of Onset  . Stroke Father   . Heart attack  Brother   . Osteoarthritis Mother   . Cancer Sister   . Prostate cancer Neg Hx   . Kidney cancer Neg Hx   . Bladder Cancer Neg Hx     Social History:  reports that he has never smoked. He has never used smokeless tobacco.  He reports that he does not drink alcohol or use drugs.  ROS: UROLOGY Frequent Urination?: No Hard to postpone urination?: Yes Burning/pain with urination?: No Get up at night to urinate?: Yes Leakage of urine?: No Urine stream starts and stops?: Yes Trouble starting stream?: No Do you have to strain to urinate?: No Blood in urine?: No Urinary tract infection?: No Sexually transmitted disease?: No Injury to kidneys or bladder?: No Painful intercourse?: No Weak stream?: No Erection problems?: No Penile pain?: No  Gastrointestinal Nausea?: No Vomiting?: No Indigestion/heartburn?: No Diarrhea?: No Constipation?: No  Constitutional Fever: No Night sweats?: No Weight loss?: No Fatigue?: No  Skin Skin rash/lesions?: No Itching?: No  Eyes Blurred vision?: No Double vision?: No  Ears/Nose/Throat Sore throat?: No Sinus problems?: No  Hematologic/Lymphatic Swollen glands?: No Easy bruising?: No  Cardiovascular Leg swelling?: Yes Chest pain?: No  Respiratory Cough?: No Shortness of breath?: Yes  Endocrine Excessive thirst?: Yes  Musculoskeletal Back pain?: No Joint pain?: No  Neurological Headaches?: No Dizziness?: No  Psychologic Depression?: No Anxiety?: No  Physical Exam: BP 127/72   Pulse 78   Ht 6' (1.829 m)   Wt 159 lb 6.4 oz (72.3 kg)   BMI 21.62 kg/m   Constitutional: Well nourished. Alert and oriented, No acute distress. HEENT: [place] AT, moist mucus membranes. Trachea midline, no masses. Cardiovascular: No clubbing, cyanosis, or edema. Respiratory: Normal respiratory effort, no increased work of breathing. Skin: No rashes, bruises or suspicious lesions. Lymph: No cervical or inguinal adenopathy. Neurologic:  Grossly intact, no focal deficits, moving all 4 extremities. Psychiatric: Normal mood and affect.  Laboratory Data: Lab Results  Component Value Date   WBC 76.0 (HH) 12/01/2016   HGB 13.1 12/01/2016   HCT 40.7 12/01/2016   MCV 99.4 12/01/2016   PLT 159 12/01/2016    Pertinent Imaging: Results for Nicholas Elliott, Nicholas Elliott (MRN [id number]) as of 03/14/2017 15:31  Ref. Range 03/14/2017 15:27  Scan Result Unknown 31     Assessment & Plan:    1. History of hematuria:   CT Urogram did not demonstrated any etiology for the hematuria.  Cystoscopy noted an enlarged prostate.  He denies any gross hematuria.   Will continue to monitor for gross hematuria.    2. Urge incontinence:   Suspect urgency and urge incontinence related to overactivity following the stroke. Failed Vesicare and Myrbetriq.  Symptoms worsening. We discussed a referral to physical therapy and/or PTNS.  He is interested in scheduling PTNS, but he could not afford the co-pays for PTNS.    3. BPH with LUTS:   IPSS 20/5.  It is worsening.  Somewhat enlarged, obstructive appearing prostate on cystoscopy on 07/2015, minimal obstructive symptoms. Postvoid residuals  have been minimal.  He will RTC in one year for IPSS score and exam.   4. Nocturia  - I explained to the patient that nocturia is often multi-factorial and difficult to treat.  Sleeping disorders, heart conditions, peripheral vascular disease, diabetes, an enlarged prostate for men, an urethral stricture causing bladder outlet obstruction and/or certain medications can contribute to nocturia.  - I have suggested that the patient avoid caffeine after noon and alcohol in the evening.  He or she may also benefit from fluid restrictions after 6:00 in the evening and voiding just prior to bedtime.  - I have explained that research studies have showed that over 84% of patients with sleep apnea reported frequent nighttime urination.   With sleep apnea, oxygen decreases, carbon dioxide  increases, the  blood become more acidic, the heart rate drops and blood vessels in the lung constrict.  The body is then alerted that something is very wrong. The sleeper must wake enough to reopen the airway. By this time, the heart is racing and experiences a false signal of fluid overload. The heart excretes a hormone-like protein that tells the body to get rid of sodium and water, resulting in nocturia.  -  I also informed the patient that a recent study noted that decreasing sodium intake to 2.3 grams daily, if they don't have issues with hyponatremia, can also reduce the number of nightly voids  - The patient may benefit from a discussion with his or her primary care physician to see if he or she has risk factors for sleep apnea or other sleep disturbances and obtaining a sleep study.    Return in about 1 year (around 03/14/2018) for IPSS, PVR and exam.  These notes generated with voice recognition software. I apologize for typographical errors.  Zara Council, [facility] [address] [place], [place] [address] [phone]

## 2017-03-14 ENCOUNTER — Encounter: Payer: Self-pay | Admitting: Urology

## 2017-03-14 ENCOUNTER — Ambulatory Visit (INDEPENDENT_AMBULATORY_CARE_PROVIDER_SITE_OTHER): Payer: Medicare Other | Admitting: Urology

## 2017-03-14 VITALS — BP 127/72 | HR 78 | Ht 72.0 in | Wt 159.4 lb

## 2017-03-14 DIAGNOSIS — R351 Nocturia: Secondary | ICD-10-CM

## 2017-03-14 DIAGNOSIS — N3941 Urge incontinence: Secondary | ICD-10-CM

## 2017-03-14 DIAGNOSIS — N138 Other obstructive and reflux uropathy: Secondary | ICD-10-CM

## 2017-03-14 DIAGNOSIS — Z87448 Personal history of other diseases of urinary system: Secondary | ICD-10-CM

## 2017-03-14 DIAGNOSIS — N401 Enlarged prostate with lower urinary tract symptoms: Secondary | ICD-10-CM

## 2017-03-14 LAB — BLADDER SCAN AMB NON-IMAGING: SCAN RESULT: 31

## 2017-03-17 ENCOUNTER — Emergency Department
Admission: EM | Admit: 2017-03-17 | Discharge: 2017-03-17 | Disposition: A | Discharge status: Home / Self Care | Payer: Medicare Other | Attending: Emergency Medicine | Admitting: Emergency Medicine

## 2017-03-17 ENCOUNTER — Emergency Department: Payer: Medicare Other

## 2017-03-17 DIAGNOSIS — Y92012 Bathroom of single-family (private) house as the place of occurrence of the external cause: Secondary | ICD-10-CM | POA: Insufficient documentation

## 2017-03-17 DIAGNOSIS — W010XXA Fall on same level from slipping, tripping and stumbling without subsequent striking against object, initial encounter: Secondary | ICD-10-CM | POA: Diagnosis not present

## 2017-03-17 DIAGNOSIS — Z7982 Long term (current) use of aspirin: Secondary | ICD-10-CM | POA: Diagnosis not present

## 2017-03-17 DIAGNOSIS — Y998 Other external cause status: Secondary | ICD-10-CM | POA: Insufficient documentation

## 2017-03-17 DIAGNOSIS — W19XXXA Unspecified fall, initial encounter: Secondary | ICD-10-CM

## 2017-03-17 DIAGNOSIS — M25569 Pain in unspecified knee: Secondary | ICD-10-CM

## 2017-03-17 DIAGNOSIS — Y9301 Activity, walking, marching and hiking: Secondary | ICD-10-CM | POA: Diagnosis not present

## 2017-03-17 DIAGNOSIS — M25562 Pain in left knee: Secondary | ICD-10-CM | POA: Insufficient documentation

## 2017-03-17 DIAGNOSIS — Z79899 Other long term (current) drug therapy: Secondary | ICD-10-CM | POA: Insufficient documentation

## 2017-03-17 DIAGNOSIS — Z7902 Long term (current) use of antithrombotics/antiplatelets: Secondary | ICD-10-CM | POA: Insufficient documentation

## 2017-03-17 DIAGNOSIS — I10 Essential (primary) hypertension: Secondary | ICD-10-CM | POA: Diagnosis not present

## 2017-03-17 DIAGNOSIS — G459 Transient cerebral ischemic attack, unspecified: Secondary | ICD-10-CM | POA: Diagnosis not present

## 2017-03-17 DIAGNOSIS — I480 Paroxysmal atrial fibrillation: Secondary | ICD-10-CM | POA: Insufficient documentation

## 2017-03-17 LAB — CBC WITH DIFFERENTIAL/PLATELET
BASOS ABS: 0.7 10*3/uL — AB (ref 0–0.1)
Basophils Relative: 1 %
EOS PCT: 1 %
Eosinophils Absolute: 0.7 10*3/uL (ref 0–0.7)
HCT: 41.1 % (ref 40.0–52.0)
HEMOGLOBIN: 13.3 g/dL (ref 13.0–18.0)
LYMPHS ABS: 60.6 10*3/uL — AB (ref 1.0–3.6)
Lymphocytes Relative: 83 %
MCH: 31.9 pg (ref 26.0–34.0)
MCHC: 32.3 g/dL (ref 32.0–36.0)
MCV: 98.8 fL (ref 80.0–100.0)
MONO ABS: 2.2 10*3/uL — AB (ref 0.2–1.0)
Monocytes Relative: 3 %
NEUTROS PCT: 12 %
Neutro Abs: 8.7 10*3/uL — ABNORMAL HIGH (ref 1.4–6.5)
PLATELETS: 146 10*3/uL — AB (ref 150–440)
RBC: 4.16 MIL/uL — AB (ref 4.40–5.90)
RDW: 14 % (ref 11.5–14.5)
WBC: 72.9 10*3/uL — AB (ref 3.8–10.6)

## 2017-03-17 LAB — BASIC METABOLIC PANEL
ANION GAP: 7 (ref 5–15)
BUN: 21 mg/dL — ABNORMAL HIGH (ref 6–20)
CHLORIDE: 108 mmol/L (ref 101–111)
CO2: 28 mmol/L (ref 22–32)
Calcium: 8.6 mg/dL — ABNORMAL LOW (ref 8.9–10.3)
Creatinine, Ser: 0.76 mg/dL (ref 0.61–1.24)
GFR calc Af Amer: >60 mL/min (ref >60)
Glucose, Bld: 96 mg/dL (ref 65–99)
POTASSIUM: 4.3 mmol/L (ref 3.5–5.1)
SODIUM: 143 mmol/L (ref 135–145)

## 2017-03-17 LAB — URINALYSIS, COMPLETE (UACMP) WITH MICROSCOPIC
BACTERIA UA: NONE SEEN
BILIRUBIN URINE: NEGATIVE
Glucose, UA: NEGATIVE mg/dL
Ketones, ur: NEGATIVE mg/dL
Leukocytes, UA: NEGATIVE
Nitrite: NEGATIVE
PROTEIN: NEGATIVE mg/dL
SPECIFIC GRAVITY, URINE: 1.017 (ref 1.005–1.030)
SQUAMOUS EPITHELIAL / LPF: NONE SEEN
pH: 5 (ref 5.0–8.0)

## 2017-03-17 MED ORDER — DICLOFENAC SODIUM 1 % TD GEL: 2.0000 g | Freq: Four times a day (QID) | TRANSDERMAL | 0 refills | Status: DC

## 2017-03-17 NOTE — ED Notes (Signed)
Pt stood up at bedside to give urine sample - pain with putting any weigh on the knee and almost fell when he did put weight on it.

## 2017-03-17 NOTE — ED Notes (Signed)
Pt to ct 

## 2017-03-17 NOTE — ED Provider Notes (Signed)
[facility] Emergency Department Provider Note   ____________________________________________   I have reviewed the triage vital signs and the nursing notes.   HISTORY  Chief Complaint Fall   History limited by: Not Limited   HPI Nicholas Elliott is a 83 y.o. male who presents to the emergency department today because of concerns for left knee pain. The patient states he fell last night trying to get up bathroom. He thinks he tripped on something when he turned around quickly. He states he fell on that knee. He has been having pain since then. It is located on the distal medial part of his knee. The patient denies any other injury.   Past Medical History:  Diagnosis Date  . A-fib ([place]) 04/08/2014  . AF (paroxysmal atrial fibrillation) ([place]) 04/11/2015  . Arthritis   . Arthritis   . Carpal tunnel syndrome 01/23/2014  . Cerebral vascular accident ([place]) 04/11/2015  . Cervical spinal stenosis 01/23/2014  . CLL (chronic lymphocytic leukemia) ([place])   . CLL (chronic lymphocytic leukemia) ([place])   . DDD (degenerative disc disease), cervical 01/23/2014  . DDD (degenerative disc disease), lumbar 09/11/2014  . Degenerative arthritis of lumbar spine 01/23/2014  . Episode of syncope 04/11/2015  . Facial droop 04/02/2015  . Forehead contusion 04/03/2015  . Glaucoma   . Heart valve disease 04/08/2014  . High cholesterol   . High cholesterol   . HLD (hyperlipidemia) 04/08/2014  . Hyperglycemia 04/03/2015  . Left elbow contusion 04/03/2015  . Left hip pain 04/03/2015  . Migraine   . Migraine   . Nerve root pain 01/23/2014  . Neuritis or radiculitis due to rupture of lumbar intervertebral disc 09/11/2014  . Stroke ([place]) 04/02/2015  . TIA (transient ischemic attack)   . TIA (transient ischemic attack)     Patient Active Problem List   Diagnosis Date Noted  . BPH with obstruction/lower urinary tract symptoms 12/21/2015  . Microscopic hematuria 12/21/2015  . Urge incontinence  12/21/2015  . Benign essential HTN 06/30/2015  . Cerebral vascular accident ([place]) 04/11/2015  . AF (paroxysmal atrial fibrillation) ([place]) 04/11/2015  . Episode of syncope 04/11/2015  . Cerebrovascular accident (CVA) ([place]) 04/11/2015  . Paroxysmal atrial fibrillation ([place]) 04/11/2015  . Left hip pain 04/03/2015  . Forehead contusion 04/03/2015  . Left elbow contusion 04/03/2015  . Hyperglycemia 04/03/2015  . Facial droop 04/02/2015  . Stroke ([place]) 04/02/2015  . High cholesterol   . TIA (transient ischemic attack)   . Arthritis   . Migraine   . CLL (chronic lymphocytic leukemia) ([place])   . Chronic lymphocytic leukemia ([place]) 09/16/2014  . DDD (degenerative disc disease), lumbar 09/11/2014  . Neuritis or radiculitis due to rupture of lumbar intervertebral disc 09/11/2014  . A-fib ([place]) 04/08/2014  . Heart valve disease 04/08/2014  . HLD (hyperlipidemia) 04/08/2014  . Atrial fibrillation ([place]) 04/08/2014  . Carpal tunnel syndrome 01/23/2014  . DDD (degenerative disc disease), cervical 01/23/2014  . Cervical spinal stenosis 01/23/2014  . Degenerative arthritis of lumbar spine 01/23/2014  . Nerve root pain 01/23/2014  . Degeneration of intervertebral disc of cervical region 01/23/2014    Past Surgical History:  Procedure Laterality Date  . HERNIA REPAIR    . SPINE SURGERY    . upper palate reparin      Prior to Admission medications   Medication Sig Start Date End Date Taking? Authorizing Provider  albuterol (PROAIR HFA) 108 (90 BASE) MCG/ACT inhaler Inhale into the lungs every 6 (six) hours as needed  for wheezing or shortness of breath. Reported on 03/03/2016 10/07/14 10/07/15  [provider]  aspirin EC 81 MG tablet Take 81 mg by mouth daily. Reported on 01/07/2016    [provider]  chlorpheniramine (CHLOR-TRIMETON) 4 MG tablet Take 1 tablet (4 mg total) by mouth 2 (two) times daily as needed for allergies or rhinitis. Patient not taking: Reported on 09/02/2016  08/30/15   Beers, Pierce Crane, PA-C  clobetasol (TEMOVATE) 0.05 % external solution Reported on 01/07/2016 01/06/16   [provider]  clopidogrel (PLAVIX) 75 MG tablet Take 75 mg by mouth at bedtime.     [provider]  digoxin (LANOXIN) 0.125 MG tablet Take 0.125 mg by mouth daily.     [provider]  fluticasone (FLONASE) 50 MCG/ACT nasal spray Place into the nose. 11/22/16 11/22/17  [provider]  furosemide (LASIX) 20 MG tablet Reported on 01/07/2016 03/17/15   [provider]  gabapentin (NEURONTIN) 400 MG capsule 600 mg. Reported on 01/07/2016 05/27/15   [provider]  hydrocortisone 2.5 % ointment Apply topically. 09/29/16   [provider]  ketoconazole (NIZORAL) 2 % shampoo Use 2-3x weekly on face and scalp. Let sit for 2-3 min before rinsing. 09/29/16   [provider]  meloxicam (MOBIC) 7.5 MG tablet Take by mouth. 12/27/16 12/27/17  [provider]  mirabegron ER (MYRBETRIQ) 25 MG TB24 tablet Take 1 tablet (25 mg total) by mouth daily. 09/16/15   Hollice Espy, MD  Misc Natural Products (OSTEO BI-FLEX JOINT SHIELD PO) Take by mouth.    [provider]  Multiple Vitamins-Minerals (CENTRUM SILVER ADULT 50+ PO) Take by mouth daily.    [provider]  rosuvastatin (CRESTOR) 20 MG tablet Take 20 mg by mouth at bedtime. 02/18/15   [provider]  simethicone (PHAZYME) 125 MG chewable tablet Chew 125 mg by mouth 2 (two) times daily. Reported on 01/07/2016    [provider]  traMADol Veatrice Bourbon) 50 MG tablet  03/22/15   [provider]  vitamin B-12 100 MCG tablet Take 1 tablet (100 mcg total) by mouth daily. 04/08/15   Geradine Girt, DO    Allergies Patient has no known allergies.  Family History  Problem Relation Age of Onset  . Stroke Father   . Heart attack Brother   . Osteoarthritis Mother   . Cancer Sister   . Prostate cancer Neg Hx   . Kidney cancer Neg Hx   .  Bladder Cancer Neg Hx     Social History Social History  Substance Use Topics  . Smoking status: Never Smoker  . Smokeless tobacco: Never Used  . Alcohol use No    Review of Systems Constitutional: No fever/chills Eyes: No visual changes. ENT: No sore throat. Cardiovascular: Denies chest pain. Respiratory: Denies shortness of breath. Gastrointestinal: No abdominal pain.  No nausea, no vomiting.  No diarrhea.   Genitourinary: Negative for dysuria. Musculoskeletal: Positive for left knee pain. Skin: Negative for rash. Neurological: Negative for headaches, focal weakness or numbness.  ____________________________________________   PHYSICAL EXAM:  VITAL SIGNS: ED Triage Vitals  Enc Vitals Group     BP 03/17/17 1120 (!) 142/94     Pulse --      Resp 03/17/17 1120 18     Temp 03/17/17 1119 98.4 F (36.9 C)     Temp Source 03/17/17 1119 Oral     SpO2 03/17/17 1120 98 %     Weight 03/17/17 1121 159 lb (72.1  kg)     Height 03/17/17 1121 6' (1.829 m)     Head Circumference --      Peak Flow --      Pain Score 03/17/17 1120 9   Constitutional: Alert and oriented. Well appearing and in no distress. Eyes: Conjunctivae are normal.  ENT   Head: Normocephalic and atraumatic.   Nose: No congestion/rhinnorhea.   Mouth/Throat: Mucous membranes are moist.   Neck: No stridor. Hematological/Lymphatic/Immunilogical: No cervical lymphadenopathy. Cardiovascular: Normal rate, regular rhythm.  No murmurs, rubs, or gallops.  Respiratory: Normal respiratory effort without tachypnea nor retractions. Breath sounds are clear and equal bilaterally. No wheezes/rales/rhonchi. Gastrointestinal: Soft and non tender. No rebound. No guarding.  Genitourinary: Deferred Musculoskeletal: Normal range of motion in all extremities. No lower extremity edema. Neurologic:  Normal speech and language. No gross focal neurologic deficits are appreciated.  Skin:  Skin is warm, dry and intact. No  rash noted. Psychiatric: Mood and affect are normal. Speech and behavior are normal. Patient exhibits appropriate insight and judgment.  ____________________________________________    LABS (pertinent positives/negatives)  Labs Reviewed  URINALYSIS, COMPLETE (UACMP) WITH MICROSCOPIC - Abnormal; Notable for the following:       Result Value   Color, Urine YELLOW (*)    APPearance CLEAR (*)    Hgb urine dipstick MODERATE (*)    All other components within normal limits  CBC WITH DIFFERENTIAL/PLATELET - Abnormal; Notable for the following:    WBC 72.9 (*)    RBC 4.16 (*)    Platelets 146 (*)    Neutro Abs 8.7 (*)    Lymphs Abs 60.6 (*)    Monocytes Absolute 2.2 (*)    Basophils Absolute 0.7 (*)    All other components within normal limits  BASIC METABOLIC PANEL - Abnormal; Notable for the following:    BUN 21 (*)    Calcium 8.6 (*)    All other components within normal limits     ____________________________________________   EKG  None  ____________________________________________    RADIOLOGY  Left knee IMPRESSION:  Osseous demineralization with mild degenerative changes LEFT knee.    No acute bony abnormalities.      ____________________________________________   PROCEDURES  Procedures  ____________________________________________   INITIAL IMPRESSION / ASSESSMENT AND PLAN / ED COURSE  Pertinent labs & imaging results that were available during my care of the patient were reviewed by me and considered in my medical decision making (see chart for details).  Patient presented to the emergency department today because of concerns for the pain after a fall. X-rays and exam findings. Blood work does show a white blood cell count which is consistent with patient's known history of CLL. Will discharge.  ____________________________________________   FINAL CLINICAL IMPRESSION(S) / ED DIAGNOSES  Final diagnoses:  Fall, initial encounter  Knee pain,  unspecified chronicity, unspecified laterality     Note: This dictation was prepared with Dragon dictation. Any transcriptional errors that result from this process are unintentional     Nance Pear, MD 03/17/17 1557

## 2017-03-17 NOTE — ED Triage Notes (Signed)
Pt is from home, states he tripped last night and landed on his left knee and bumped his forehead. Pt also states he is seeing red bugs on the wall tht no one else can see. Pt alert, oriented, able to answer all questions. No bruising noted on knee or forehead

## 2017-03-17 NOTE — ED Notes (Signed)
Pt given a pillow. Family at bedside.

## 2017-03-17 NOTE — Discharge Instructions (Signed)
Please seek medical attention for any high fevers, chest pain, shortness of breath, change in behavior, persistent vomiting, bloody stool or any other new or concerning symptoms.  

## 2017-03-22 NOTE — Progress Notes (Signed)
[place]  Telephone:(336) [phone] Fax:(336) [phone]  ID: Nicholas Elliott OB: 04-28-1922  MR#: [id number]  WLS#:[id number]  Patient Care Team: Tracie Harrier, MD as PCP - General (Internal Medicine)  CHIEF COMPLAINT: CLL  INTERVAL HISTORY: Patient returns to clinic today for laboratory work and routine evaluation. He continues to feel well and remains asymptomatic. He is recovering from a fall last Wednesday. He states he lost his balance and fell and landed on his right knee and hit his head. He was seen in the ED. No fractures found. He denies any fevers, chills, or night sweats. He denies any weight loss. He denies any chest pain or shortness of breath.  He has a good appetite and denies any nausea, vomiting, constipation, or diarrhea. He has no urinary complaints.  Patient offers no specific complaints today.  REVIEW OF SYSTEMS:   Review of Systems  Constitutional: Negative for chills, fever, malaise/fatigue and weight loss.  Respiratory: Negative.  Negative for cough and shortness of breath.   Cardiovascular: Negative.  Negative for chest pain and leg swelling.  Gastrointestinal: Negative.  Negative for abdominal pain.  Musculoskeletal: Negative.        Recent Fall  Neurological: Positive for speech change. Negative for sensory change and weakness.  Endo/Heme/Allergies: Does not bruise/bleed easily.  Psychiatric/Behavioral: Negative.  The patient is not nervous/anxious.     As per HPI. Otherwise, a complete review of systems is negative.  PAST MEDICAL HISTORY: Past Medical History:  Diagnosis Date  . A-fib ([place]) 04/08/2014  . AF (paroxysmal atrial fibrillation) ([place]) 04/11/2015  . Arthritis   . Arthritis   . Carpal tunnel syndrome 01/23/2014  . Cerebral vascular accident ([place]) 04/11/2015  . Cervical spinal stenosis 01/23/2014  . CLL (chronic lymphocytic leukemia) ([place])   . CLL (chronic lymphocytic leukemia) ([place])   . DDD (degenerative disc disease),  cervical 01/23/2014  . DDD (degenerative disc disease), lumbar 09/11/2014  . Degenerative arthritis of lumbar spine 01/23/2014  . Episode of syncope 04/11/2015  . Facial droop 04/02/2015  . Forehead contusion 04/03/2015  . Glaucoma   . Heart valve disease 04/08/2014  . High cholesterol   . High cholesterol   . HLD (hyperlipidemia) 04/08/2014  . Hyperglycemia 04/03/2015  . Left elbow contusion 04/03/2015  . Left hip pain 04/03/2015  . Migraine   . Migraine   . Nerve root pain 01/23/2014  . Neuritis or radiculitis due to rupture of lumbar intervertebral disc 09/11/2014  . Stroke ([place]) 04/02/2015  . TIA (transient ischemic attack)   . TIA (transient ischemic attack)     PAST SURGICAL HISTORY: Past Surgical History:  Procedure Laterality Date  . HERNIA REPAIR    . SPINE SURGERY    . upper palate reparin      FAMILY HISTORY Family History  Problem Relation Age of Onset  . Stroke Father   . Heart attack Brother   . Osteoarthritis Mother   . Cancer Sister   . Prostate cancer Neg Hx   . Kidney cancer Neg Hx   . Bladder Cancer Neg Hx        ADVANCED DIRECTIVES:    HEALTH MAINTENANCE: Social History  Substance Use Topics  . Smoking status: Never Smoker  . Smokeless tobacco: Never Used  . Alcohol use No     Colonoscopy:  PAP:  Bone density:  Lipid panel:  No Known Allergies  Current Outpatient Prescriptions  Medication Sig Dispense Refill  . aspirin EC 81 MG tablet Take 81 mg  by mouth daily. Reported on 01/07/2016    . chlorpheniramine (CHLOR-TRIMETON) 4 MG tablet Take 1 tablet (4 mg total) by mouth 2 (two) times daily as needed for allergies or rhinitis. 30 tablet 0  . clobetasol (TEMOVATE) 0.05 % external solution Reported on 01/07/2016    . clopidogrel (PLAVIX) 75 MG tablet Take 75 mg by mouth at bedtime.     . diclofenac sodium (VOLTAREN) 1 % GEL Apply 2 g topically 4 (four) times daily. 100 g 0  . digoxin (LANOXIN) 0.125 MG tablet Take 0.125 mg by mouth daily.     .  fluticasone (FLONASE) 50 MCG/ACT nasal spray Place into the nose.    . furosemide (LASIX) 20 MG tablet Reported on 01/07/2016    . gabapentin (NEURONTIN) 400 MG capsule 600 mg. Reported on 01/07/2016    . hydrocortisone 2.5 % ointment Apply topically.    Marland Kitchen ketoconazole (NIZORAL) 2 % shampoo Use 2-3x weekly on face and scalp. Let sit for 2-3 min before rinsing.    . meloxicam (MOBIC) 7.5 MG tablet Take by mouth.    . mirabegron ER (MYRBETRIQ) 25 MG TB24 tablet Take 1 tablet (25 mg total) by mouth daily. 30 tablet 11  . Misc Natural Products (OSTEO BI-FLEX JOINT SHIELD PO) Take by mouth.    . Multiple Vitamins-Minerals (CENTRUM SILVER ADULT 50+ PO) Take by mouth daily.    . rosuvastatin (CRESTOR) 20 MG tablet Take 20 mg by mouth at bedtime.    . simethicone (PHAZYME) 125 MG chewable tablet Chew 125 mg by mouth 2 (two) times daily. Reported on 01/07/2016    . traMADol (ULTRAM) 50 MG tablet     . vitamin B-12 100 MCG tablet Take 1 tablet (100 mcg total) by mouth daily.    Marland Kitchen albuterol (PROAIR HFA) 108 (90 BASE) MCG/ACT inhaler Inhale into the lungs every 6 (six) hours as needed for wheezing or shortness of breath. Reported on 03/03/2016     No current facility-administered medications for this visit.     OBJECTIVE: Vitals:   03/24/17 1405  BP: 125/73  Pulse: 85  Resp: 20  Temp: 97.8 F (36.6 C)     Body mass index is 21.67 kg/m.    ECOG FS:0 - Asymptomatic  General: Well-developed, well-nourished, no acute distress. Eyes: anicteric sclera. Lungs: Clear to auscultation bilaterally. Heart: Regular rate and rhythm. No rubs, murmurs, or gallops. Abdomen: Soft, nontender, nondistended. No organomegaly noted, normoactive bowel sounds. Musculoskeletal: No edema, cyanosis, or clubbing. Neuro: Alert, answering all questions appropriately. Cranial nerves grossly intact. Skin: No rashes or petechiae noted. Psych: Normal affect.   LAB RESULTS:  Lab Results  Component Value Date   NA 143  03/17/2017   K 4.3 03/17/2017   CL 108 03/17/2017   CO2 28 03/17/2017   GLUCOSE 96 03/17/2017   BUN 21 (H) 03/17/2017   CREATININE 0.76 03/17/2017   CALCIUM 8.6 (L) 03/17/2017   PROT 6.2 (L) 04/02/2015   ALBUMIN 3.9 04/02/2015   AST 44 (H) 04/02/2015   ALT 22 04/02/2015   ALKPHOS 48 04/02/2015   BILITOT 0.6 04/02/2015   GFRNONAA >60 03/17/2017   GFRAA >60 03/17/2017    Lab Results  Component Value Date   WBC 70.0 (HH) 03/24/2017   NEUTROABS 5.5 03/24/2017   HGB 12.8 (L) 03/24/2017   HCT 39.2 (L) 03/24/2017   MCV 98.1 03/24/2017   PLT 157 03/24/2017     STUDIES: Ct Head Wo Contrast  Result Date: 03/17/2017 CLINICAL DATA:  Fall and hallucinations. EXAM: CT HEAD WITHOUT CONTRAST TECHNIQUE: Contiguous axial images were obtained from the base of the skull through the vertex without intravenous contrast. COMPARISON:  Brain MRI 04/02/2015 FINDINGS: Brain: No mass lesion, intraparenchymal hemorrhage or extra-axial collection. No evidence of acute cortical infarct. Encephalomalacia at the site of old right frontal lobe infarct. There is periventricular hypoattenuation compatible with chronic microvascular disease. Vascular: No hyperdense vessel or unexpected calcification. Skull: Normal visualized skull base, calvarium and extracranial soft tissues. Sinuses/Orbits: No sinus fluid levels or advanced mucosal thickening. No mastoid effusion. Normal orbits. IMPRESSION: Old right frontal lobe infarct and chronic microvascular ischemia without acute intracranial abnormality. Electronically Signed   By: Ulyses Jarred M.D.   On: 03/17/2017 14:42   Dg Knee Complete 4 Views Left  Result Date: 03/17/2017 CLINICAL DATA:  Tripped last night and fell landing on LEFT knee, LEFT knee pain/injury EXAM: LEFT KNEE - COMPLETE 4+ VIEW COMPARISON:  None FINDINGS: Osseous demineralization. Mild medial compartment joint space narrowing and spur formation. Joint spaces otherwise preserved. No acute fracture,  dislocation, or bone destruction. Scattered clothing artifacts. No knee joint effusion. IMPRESSION: Osseous demineralization with mild degenerative changes LEFT knee. No acute bony abnormalities. Electronically Signed   By: Lavonia Dana M.D.   On: 03/17/2017 12:00    ASSESSMENT: Rai stage 0 CLL, Zap 70 negative.  PLAN:    1.  CLL: Confirmed by flow cytometry.  Patient's white count has remained stable and at patients baseline. No intervention is required.  Patient does not require treatment at this time. CT scan of the abdomen and pelvis on January 02, 2016 only revealed mild splenomegaly. Previously chest CT was negative as well. Imaging does not need to be repeated unless there is suspicion of progression. Return to clinic in 3 months for repeat laboratory work and then in 6 months for laboratory work and further evaluation.  2. Thrombocytopenia: Patient's platelet count is within normal limits today.  Patient expressed understanding and was in agreement with this plan. He also understands that He can call clinic at any time with any questions, concerns, or complaints.   Faythe Casa, NP  Jacquelin Hawking, NP 03/24/17 2:24 PM

## 2017-03-24 ENCOUNTER — Inpatient Hospital Stay: Payer: Medicare Other

## 2017-03-24 ENCOUNTER — Inpatient Hospital Stay: Payer: Medicare Other | Attending: Oncology | Admitting: Oncology

## 2017-03-24 ENCOUNTER — Other Ambulatory Visit: Payer: Self-pay | Admitting: *Deleted

## 2017-03-24 VITALS — BP 125/73 | HR 85 | Temp 97.8°F | Resp 20 | Wt 159.8 lb

## 2017-03-24 DIAGNOSIS — M5030 Other cervical disc degeneration, unspecified cervical region: Secondary | ICD-10-CM | POA: Diagnosis not present

## 2017-03-24 DIAGNOSIS — D696 Thrombocytopenia, unspecified: Secondary | ICD-10-CM | POA: Insufficient documentation

## 2017-03-24 DIAGNOSIS — C9110 Chronic lymphocytic leukemia of B-cell type not having achieved remission: Secondary | ICD-10-CM

## 2017-03-24 DIAGNOSIS — E7800 Pure hypercholesterolemia, unspecified: Secondary | ICD-10-CM | POA: Diagnosis not present

## 2017-03-24 DIAGNOSIS — I4891 Unspecified atrial fibrillation: Secondary | ICD-10-CM | POA: Diagnosis not present

## 2017-03-24 DIAGNOSIS — Z79899 Other long term (current) drug therapy: Secondary | ICD-10-CM | POA: Diagnosis not present

## 2017-03-24 DIAGNOSIS — H409 Unspecified glaucoma: Secondary | ICD-10-CM | POA: Diagnosis not present

## 2017-03-24 DIAGNOSIS — Z8673 Personal history of transient ischemic attack (TIA), and cerebral infarction without residual deficits: Secondary | ICD-10-CM | POA: Diagnosis not present

## 2017-03-24 DIAGNOSIS — Z7982 Long term (current) use of aspirin: Secondary | ICD-10-CM | POA: Diagnosis not present

## 2017-03-24 DIAGNOSIS — I480 Paroxysmal atrial fibrillation: Secondary | ICD-10-CM | POA: Diagnosis not present

## 2017-03-24 DIAGNOSIS — M5136 Other intervertebral disc degeneration, lumbar region: Secondary | ICD-10-CM | POA: Insufficient documentation

## 2017-03-24 LAB — CBC WITH DIFFERENTIAL/PLATELET
BASOS ABS: 0.2 10*3/uL — AB (ref 0–0.1)
Basophils Relative: 0 %
Eosinophils Absolute: 0.2 10*3/uL (ref 0–0.7)
Eosinophils Relative: 0 %
HEMATOCRIT: 39.2 % — AB (ref 40.0–52.0)
Hemoglobin: 12.8 g/dL — ABNORMAL LOW (ref 13.0–18.0)
LYMPHS PCT: 91 %
Lymphs Abs: 63.3 10*3/uL — ABNORMAL HIGH (ref 1.0–3.6)
MCH: 32.1 pg (ref 26.0–34.0)
MCHC: 32.7 g/dL (ref 32.0–36.0)
MCV: 98.1 fL (ref 80.0–100.0)
Monocytes Absolute: 0.8 10*3/uL (ref 0.2–1.0)
Monocytes Relative: 1 %
NEUTROS ABS: 5.5 10*3/uL (ref 1.4–6.5)
Neutrophils Relative %: 8 %
Platelets: 157 10*3/uL (ref 150–440)
RBC: 3.99 MIL/uL — AB (ref 4.40–5.90)
RDW: 14.3 % (ref 11.5–14.5)
WBC: 70 10*3/uL — AB (ref 3.8–10.6)

## 2017-03-24 NOTE — Progress Notes (Signed)
Patient denies any concerns today.  

## 2017-03-25 ENCOUNTER — Telehealth: Payer: Self-pay | Admitting: *Deleted

## 2017-03-25 NOTE — Telephone Encounter (Signed)
Entered in error

## 2017-04-12 DIAGNOSIS — I350 Nonrheumatic aortic (valve) stenosis: Secondary | ICD-10-CM | POA: Insufficient documentation

## 2017-06-23 ENCOUNTER — Inpatient Hospital Stay: Payer: Medicare Other | Attending: Oncology

## 2017-06-23 DIAGNOSIS — Z8673 Personal history of transient ischemic attack (TIA), and cerebral infarction without residual deficits: Secondary | ICD-10-CM | POA: Insufficient documentation

## 2017-06-23 DIAGNOSIS — C9110 Chronic lymphocytic leukemia of B-cell type not having achieved remission: Secondary | ICD-10-CM | POA: Diagnosis present

## 2017-06-23 DIAGNOSIS — M5030 Other cervical disc degeneration, unspecified cervical region: Secondary | ICD-10-CM | POA: Insufficient documentation

## 2017-06-23 DIAGNOSIS — I480 Paroxysmal atrial fibrillation: Secondary | ICD-10-CM | POA: Insufficient documentation

## 2017-06-23 DIAGNOSIS — Z79899 Other long term (current) drug therapy: Secondary | ICD-10-CM | POA: Insufficient documentation

## 2017-06-23 DIAGNOSIS — I4891 Unspecified atrial fibrillation: Secondary | ICD-10-CM | POA: Diagnosis not present

## 2017-06-23 DIAGNOSIS — M5136 Other intervertebral disc degeneration, lumbar region: Secondary | ICD-10-CM | POA: Diagnosis not present

## 2017-06-23 DIAGNOSIS — Z7982 Long term (current) use of aspirin: Secondary | ICD-10-CM | POA: Diagnosis not present

## 2017-06-23 DIAGNOSIS — E7800 Pure hypercholesterolemia, unspecified: Secondary | ICD-10-CM | POA: Diagnosis not present

## 2017-06-23 DIAGNOSIS — D696 Thrombocytopenia, unspecified: Secondary | ICD-10-CM | POA: Insufficient documentation

## 2017-06-23 DIAGNOSIS — H409 Unspecified glaucoma: Secondary | ICD-10-CM | POA: Insufficient documentation

## 2017-06-23 LAB — CBC WITH DIFFERENTIAL/PLATELET
Basophils Absolute: 0.2 10*3/uL — ABNORMAL HIGH (ref 0–0.1)
Basophils Relative: 0 %
EOS ABS: 0.3 10*3/uL (ref 0–0.7)
EOS PCT: 0 %
HCT: 42.3 % (ref 40.0–52.0)
Hemoglobin: 13.7 g/dL (ref 13.0–18.0)
LYMPHS ABS: 69.3 10*3/uL — AB (ref 1.0–3.6)
LYMPHS PCT: 90 %
MCH: 31.9 pg (ref 26.0–34.0)
MCHC: 32.3 g/dL (ref 32.0–36.0)
MCV: 98.7 fL (ref 80.0–100.0)
MONO ABS: 1.4 10*3/uL — AB (ref 0.2–1.0)
MONOS PCT: 2 %
Neutro Abs: 6.2 10*3/uL (ref 1.4–6.5)
Neutrophils Relative %: 8 %
PLATELETS: 185 10*3/uL (ref 150–440)
RBC: 4.28 MIL/uL — ABNORMAL LOW (ref 4.40–5.90)
RDW: 15 % — ABNORMAL HIGH (ref 11.5–14.5)
WBC: 77.3 10*3/uL — AB (ref 3.8–10.6)

## 2017-06-30 ENCOUNTER — Ambulatory Visit (INDEPENDENT_AMBULATORY_CARE_PROVIDER_SITE_OTHER): Payer: Medicare Other | Admitting: Urology

## 2017-06-30 ENCOUNTER — Encounter: Payer: Self-pay | Admitting: Urology

## 2017-06-30 VITALS — BP 123/79 | HR 97 | Ht 72.0 in | Wt 157.7 lb

## 2017-06-30 DIAGNOSIS — R3129 Other microscopic hematuria: Secondary | ICD-10-CM

## 2017-07-01 LAB — BUN+CREAT
BUN / CREAT RATIO: 29 — AB (ref 10–24)
BUN: 23 mg/dL (ref 10–36)
Creatinine, Ser: 0.8 mg/dL (ref 0.76–1.27)
GFR, EST AFRICAN AMERICAN: 90 mL/min/{1.73_m2} (ref >59)
GFR, EST NON AFRICAN AMERICAN: 78 mL/min/{1.73_m2} (ref >59)

## 2017-07-07 NOTE — Progress Notes (Signed)
1:03 PM   Nicholas Elliott Apr 04, 1922 [id number]  Referring provider: Tracie Harrier, MD [address] [facility] [place], [place] [address]  Chief Complaint  Patient presents with  . Hematuria    Dr. said monday  he had blood in urine    HPI: 83 yo WM who presents today with the complaint of blood in the urine seen on UA by PCP.    History of hematuria CT Urogram completed on 01/02/2016 noted no findings to explain his microscopic hematuria.  Somewhat enlarged, obstructive appearing prostate on cystoscopy on 07/2015,  He has not seen gross hematuria.   He was seen recently by his PCP was found to have [place].  He is not experiencing dysuria or suprapubic pubic pain. He is not having fevers, chills, nausea or vomiting.  PMH: Past Medical History:  Diagnosis Date  . A-fib ([place]) 04/08/2014  . AF (paroxysmal atrial fibrillation) ([place]) 04/11/2015  . Arthritis   . Arthritis   . Carpal tunnel syndrome 01/23/2014  . Cerebral vascular accident ([place]) 04/11/2015  . Cervical spinal stenosis 01/23/2014  . CLL (chronic lymphocytic leukemia) ([place])   . CLL (chronic lymphocytic leukemia) ([place])   . DDD (degenerative disc disease), cervical 01/23/2014  . DDD (degenerative disc disease), lumbar 09/11/2014  . Degenerative arthritis of lumbar spine 01/23/2014  . Episode of syncope 04/11/2015  . Facial droop 04/02/2015  . Forehead contusion 04/03/2015  . Glaucoma   . Heart valve disease 04/08/2014  . High cholesterol   . High cholesterol   . HLD (hyperlipidemia) 04/08/2014  . Hyperglycemia 04/03/2015  . Left elbow contusion 04/03/2015  . Left hip pain 04/03/2015  . Migraine   . Migraine   . Nerve root pain 01/23/2014  . Neuritis or radiculitis due to rupture of lumbar intervertebral disc 09/11/2014  . Stroke ([place]) 04/02/2015  . TIA (transient ischemic attack)   . TIA (transient ischemic attack)     Surgical History: Past Surgical History:  Procedure Laterality Date  . HERNIA REPAIR     . SPINE SURGERY    . upper palate reparin      Home Medications:  Allergies as of 06/30/2017   No Known Allergies     Medication List       Accurate as of 06/30/17 11:59 PM. Always use your most recent med list.          aspirin EC 81 MG tablet Take 81 mg by mouth daily. Reported on 01/07/2016   CENTRUM SILVER ADULT 50+ PO Take by mouth daily.   chlorpheniramine 4 MG tablet Commonly known as:  CHLOR-TRIMETON Take 1 tablet (4 mg total) by mouth 2 (two) times daily as needed for allergies or rhinitis.   clobetasol 0.05 % external solution Commonly known as:  TEMOVATE Reported on 01/07/2016   clopidogrel 75 MG tablet Commonly known as:  PLAVIX Take 75 mg by mouth at bedtime.   CRESTOR 20 MG tablet Generic drug:  rosuvastatin Take 20 mg by mouth at bedtime.   cyanocobalamin 100 MCG tablet Take 1 tablet (100 mcg total) by mouth daily.   diclofenac sodium 1 % Gel Commonly known as:  VOLTAREN Apply 2 g topically 4 (four) times daily.   digoxin 0.125 MG tablet Commonly known as:  LANOXIN Take 0.125 mg by mouth daily.   fluticasone 50 MCG/ACT nasal spray Commonly known as:  FLONASE Place into the nose.   furosemide 20 MG tablet Commonly known as:  LASIX Reported on 01/07/2016  gabapentin 400 MG capsule Commonly known as:  NEURONTIN 600 mg. Reported on 01/07/2016   hydrocortisone 2.5 % ointment Apply topically.   ketoconazole 2 % shampoo Commonly known as:  NIZORAL Use 2-3x weekly on face and scalp. Let sit for 2-3 min before rinsing.   meloxicam 7.5 MG tablet Commonly known as:  MOBIC Take by mouth.   mirabegron ER 25 MG Tb24 tablet Commonly known as:  MYRBETRIQ Take 1 tablet (25 mg total) by mouth daily.   MULTI-VITAMINS Tabs Take by mouth.   OSTEO BI-FLEX JOINT SHIELD PO Take by mouth.   PHAZYME 125 MG chewable tablet Generic drug:  simethicone Chew 125 mg by mouth 2 (two) times daily. Reported on 01/07/2016   PROAIR HFA 108 (90 Base)  MCG/ACT inhaler Generic drug:  albuterol Inhale into the lungs every 6 (six) hours as needed for wheezing or shortness of breath. Reported on 03/03/2016   traMADol 50 MG tablet Commonly known as:  ULTRAM       Allergies: No Known Allergies  Family History: Family History  Problem Relation Age of Onset  . Stroke Father   . Heart attack Brother   . Osteoarthritis Mother   . Cancer Sister   . Prostate cancer Neg Hx   . Kidney cancer Neg Hx   . Bladder Cancer Neg Hx     Social History:  reports that he has never smoked. He has never used smokeless tobacco. He reports that he does not drink alcohol or use drugs.  ROS: UROLOGY Frequent Urination?: No Hard to postpone urination?: No Burning/pain with urination?: No Get up at night to urinate?: No Leakage of urine?: No Urine stream starts and stops?: No Trouble starting stream?: No Do you have to strain to urinate?: No Blood in urine?: No Urinary tract infection?: No Sexually transmitted disease?: No Injury to kidneys or bladder?: No Painful intercourse?: No Weak stream?: No Erection problems?: No Penile pain?: No  Gastrointestinal Nausea?: No Vomiting?: No Indigestion/heartburn?: No Diarrhea?: No Constipation?: No  Constitutional Fever: No Night sweats?: No Weight loss?: No Fatigue?: No  Skin Skin rash/lesions?: No Itching?: No  Eyes Blurred vision?: No Double vision?: No  Ears/Nose/Throat Sore throat?: No Sinus problems?: No  Hematologic/Lymphatic Swollen glands?: No Easy bruising?: No  Cardiovascular Leg swelling?: No Chest pain?: No  Respiratory Cough?: No Shortness of breath?: No  Endocrine Excessive thirst?: No  Musculoskeletal Back pain?: No Joint pain?: No  Neurological Headaches?: No Dizziness?: No  Psychologic Depression?: No Anxiety?: No  Physical Exam: BP 123/79   Pulse 97   Ht 6' (1.829 m)   Wt 157 lb 11.2 oz (71.5 kg)   BMI 21.39 kg/m   Constitutional: Well  nourished. Alert and oriented, No acute distress. HEENT: [place] AT, moist mucus membranes. Trachea midline, no masses. Cardiovascular: No clubbing, cyanosis, or edema. Respiratory: Normal respiratory effort, no increased work of breathing. Skin: No rashes, bruises or suspicious lesions. Lymph: No cervical or inguinal adenopathy. Neurologic: Grossly intact, no focal deficits, moving all 4 extremities. Psychiatric: Normal mood and affect.  Laboratory Data: Lab Results  Component Value Date   WBC 77.3 (HH) 06/23/2017   HGB 13.7 06/23/2017   HCT 42.3 06/23/2017   MCV 98.7 06/23/2017   PLT 185 06/23/2017    I have reviewed the labs    Assessment & Plan:    1. History of hematuria:   CT Urogram did not demonstrated any etiology for the hematuria.  Cystoscopy noted an enlarged prostate.  He denies  any gross hematuria.   Will continue to monitor for gross hematuria.     Return for keep appointment in 02/2018.  These notes generated with voice recognition software. I apologize for typographical errors.  Zara Council, [facility] [address] [place], [place] [address] [phone]

## 2017-09-28 NOTE — Progress Notes (Signed)
Nicholas Elliott  Telephone:(336) [phone] Fax:(336) [phone]  ID: Nicholas Elliott OB: Mar 01, 1922  MR#: [id number]  UDJ#:[id number]  Patient Care Team: Tracie Harrier, MD as PCP - General (Internal Medicine)  CHIEF COMPLAINT: CLL  INTERVAL HISTORY: Patient returns to clinic today for laboratory work and routine evaluation.  He continues to feel well and remains asymptomatic. He denies any fevers, chills, or night sweats. He denies any weight loss. He denies any chest pain or shortness of breath.  He has a good appetite and denies any nausea, vomiting, constipation, or diarrhea.  He has no melena or hematochezia.  He has no urinary complaints.  Patient offers no specific complaints today.  REVIEW OF SYSTEMS:   Review of Systems  Constitutional: Negative for chills, fever, malaise/fatigue and weight loss.  Respiratory: Negative.  Negative for cough and shortness of breath.   Cardiovascular: Negative.  Negative for chest pain and leg swelling.  Gastrointestinal: Negative.  Negative for abdominal pain.  Musculoskeletal: Negative.   Neurological: Positive for speech change. Negative for sensory change and weakness.  Endo/Heme/Allergies: Does not bruise/bleed easily.  Psychiatric/Behavioral: Negative.  The patient is not nervous/anxious.     As per HPI. Otherwise, a complete review of systems is negative.  PAST MEDICAL HISTORY: Past Medical History:  Diagnosis Date  . A-fib ([place]) 04/08/2014  . AF (paroxysmal atrial fibrillation) ([place]) 04/11/2015  . Arthritis   . Arthritis   . Carpal tunnel syndrome 01/23/2014  . Cerebral vascular accident ([place]) 04/11/2015  . Cervical spinal stenosis 01/23/2014  . CLL (chronic lymphocytic leukemia) ([place])   . CLL (chronic lymphocytic leukemia) ([place])   . DDD (degenerative disc disease), cervical 01/23/2014  . DDD (degenerative disc disease), lumbar 09/11/2014  . Degenerative arthritis of lumbar spine 01/23/2014  . Episode of syncope 04/11/2015    . Facial droop 04/02/2015  . Forehead contusion 04/03/2015  . Glaucoma   . Heart valve disease 04/08/2014  . High cholesterol   . High cholesterol   . HLD (hyperlipidemia) 04/08/2014  . Hyperglycemia 04/03/2015  . Left elbow contusion 04/03/2015  . Left hip pain 04/03/2015  . Migraine   . Migraine   . Nerve root pain 01/23/2014  . Neuritis or radiculitis due to rupture of lumbar intervertebral disc 09/11/2014  . Stroke ([place]) 04/02/2015  . TIA (transient ischemic attack)   . TIA (transient ischemic attack)     PAST SURGICAL HISTORY: Past Surgical History:  Procedure Laterality Date  . HERNIA REPAIR    . SPINE SURGERY    . upper palate reparin      FAMILY HISTORY Family History  Problem Relation Age of Onset  . Stroke Father   . Heart attack Brother   . Osteoarthritis Mother   . Cancer Sister   . Prostate cancer Neg Hx   . Kidney cancer Neg Hx   . Bladder Cancer Neg Hx        ADVANCED DIRECTIVES:    HEALTH MAINTENANCE: Social History   Tobacco Use  . Smoking status: Never Smoker  . Smokeless tobacco: Never Used  Substance Use Topics  . Alcohol use: No  . Drug use: No     Colonoscopy:  PAP:  Bone density:  Lipid panel:  No Known Allergies  Current Outpatient Medications  Medication Sig Dispense Refill  . aspirin EC 81 MG tablet Take 81 mg by mouth daily. Reported on 01/07/2016    . Calcium Carbonate-Vitamin D (CALCIUM HIGH POTENCY/VITAMIN D) 600-200 MG-UNIT TABS Take by mouth.    Marland Kitchen  chlorpheniramine (CHLOR-TRIMETON) 4 MG tablet Take 1 tablet (4 mg total) by mouth 2 (two) times daily as needed for allergies or rhinitis. 30 tablet 0  . clobetasol (TEMOVATE) 0.05 % external solution Reported on 01/07/2016    . clopidogrel (PLAVIX) 75 MG tablet Take 75 mg by mouth at bedtime.     . diclofenac sodium (VOLTAREN) 1 % GEL Apply 2 g topically 4 (four) times daily. 100 g 0  . digoxin (LANOXIN) 0.125 MG tablet Take 0.125 mg by mouth daily.     . DULoxetine (CYMBALTA) 30  MG capsule Take 30 mg by mouth.    . fluticasone (FLONASE) 50 MCG/ACT nasal spray Place into the nose.    . furosemide (LASIX) 20 MG tablet Reported on 01/07/2016    . gabapentin (NEURONTIN) 400 MG capsule 600 mg. Reported on 01/07/2016    . Glucosamine-Chondroitin 250-200 MG TABS Take by mouth.    . hydrocortisone 2.5 % ointment Apply topically.    Marland Kitchen ketoconazole (NIZORAL) 2 % shampoo Use 2-3x weekly on face and scalp. Let sit for 2-3 min before rinsing.    . meloxicam (MOBIC) 7.5 MG tablet Take by mouth.    . mirabegron ER (MYRBETRIQ) 25 MG TB24 tablet Take 1 tablet (25 mg total) by mouth daily. 30 tablet 11  . Misc Natural Products (OSTEO BI-FLEX JOINT SHIELD PO) Take by mouth.    . Multiple Vitamin (MULTI-VITAMINS) TABS Take by mouth.    . Multiple Vitamins-Minerals (CENTRUM SILVER ADULT 50+ PO) Take by mouth daily.    . rosuvastatin (CRESTOR) 20 MG tablet Take 20 mg by mouth at bedtime.    . simethicone (PHAZYME) 125 MG chewable tablet Chew 125 mg by mouth 2 (two) times daily. Reported on 01/07/2016    . traMADol (ULTRAM) 50 MG tablet     . vitamin B-12 100 MCG tablet Take 1 tablet (100 mcg total) by mouth daily.    Marland Kitchen albuterol (PROAIR HFA) 108 (90 BASE) MCG/ACT inhaler Inhale into the lungs every 6 (six) hours as needed for wheezing or shortness of breath. Reported on 03/03/2016     No current facility-administered medications for this visit.     OBJECTIVE: Vitals:   09/29/17 1404  BP: 123/76  Pulse: 89  Temp: 97.7 F (36.5 C)     Body mass index is 20.87 kg/m.    ECOG FS:0 - Asymptomatic  General: Well-developed, well-nourished, no acute distress. Eyes: anicteric sclera. Lungs: Clear to auscultation bilaterally. Heart: Regular rate and rhythm. No rubs, murmurs, or gallops. Abdomen: Soft, nontender, nondistended. No organomegaly noted, normoactive bowel sounds. Musculoskeletal: No edema, cyanosis, or clubbing. Neuro: Alert, answering all questions appropriately. Cranial nerves  grossly intact. Skin: No rashes or petechiae noted. Psych: Normal affect.   LAB RESULTS:  Lab Results  Component Value Date   NA 143 03/17/2017   K 4.3 03/17/2017   CL 108 03/17/2017   CO2 28 03/17/2017   GLUCOSE 96 03/17/2017   BUN 23 06/30/2017   CREATININE 0.80 06/30/2017   CALCIUM 8.6 (L) 03/17/2017   PROT 6.2 (L) 04/02/2015   ALBUMIN 3.9 04/02/2015   AST 44 (H) 04/02/2015   ALT 22 04/02/2015   ALKPHOS 48 04/02/2015   BILITOT 0.6 04/02/2015   GFRNONAA 78 06/30/2017   GFRAA 90 06/30/2017    Lab Results  Component Value Date   WBC 85.2 (HH) 09/29/2017   NEUTROABS 6.0 09/29/2017   HGB 14.2 09/29/2017   HCT 45.2 09/29/2017   MCV 99.7 09/29/2017  PLT 173 09/29/2017     STUDIES: No results found.  ASSESSMENT: Rai stage 0 CLL, Zap 70 negative.  PLAN:    1.  CLL: Confirmed by flow cytometry.  Patient's white blood cell count is slowly trending up.  His count was 54 in September 2017 and now has increased to 85.2.  He has no evidence of endorgan damage.  His doubling time is not less than than 6 months.  No intervention is required.  Patient does not require treatment at this time.  CT scan of the abdomen and pelvis on January 02, 2016 only revealed mild splenomegaly. Previously chest CT was negative as well. Imaging does not need to be repeated unless there is suspicion of progression. Return to clinic in 3 months for repeat laboratory work and then in 6 months for laboratory work and further evaluation.  2. Thrombocytopenia: Patient's platelet count is within normal limits today.  Approximately 20 minutes was spent in discussion of which greater than 50% was consultation.  Patient expressed understanding and was in agreement with this plan. He also understands that He can call clinic at any time with any questions, concerns, or complaints.     Lloyd Huger, MD 10/02/17 11:15 AM

## 2017-09-29 ENCOUNTER — Inpatient Hospital Stay: Payer: Medicare Other | Attending: Oncology

## 2017-09-29 ENCOUNTER — Other Ambulatory Visit: Payer: Self-pay

## 2017-09-29 ENCOUNTER — Encounter: Payer: Self-pay | Admitting: Oncology

## 2017-09-29 ENCOUNTER — Inpatient Hospital Stay (HOSPITAL_BASED_OUTPATIENT_CLINIC_OR_DEPARTMENT_OTHER): Payer: Medicare Other | Admitting: Oncology

## 2017-09-29 VITALS — BP 123/76 | HR 89 | Temp 97.7°F | Wt 153.9 lb

## 2017-09-29 DIAGNOSIS — M5030 Other cervical disc degeneration, unspecified cervical region: Secondary | ICD-10-CM | POA: Diagnosis not present

## 2017-09-29 DIAGNOSIS — E7800 Pure hypercholesterolemia, unspecified: Secondary | ICD-10-CM | POA: Diagnosis not present

## 2017-09-29 DIAGNOSIS — Z7982 Long term (current) use of aspirin: Secondary | ICD-10-CM | POA: Insufficient documentation

## 2017-09-29 DIAGNOSIS — D696 Thrombocytopenia, unspecified: Secondary | ICD-10-CM | POA: Insufficient documentation

## 2017-09-29 DIAGNOSIS — M5136 Other intervertebral disc degeneration, lumbar region: Secondary | ICD-10-CM | POA: Diagnosis not present

## 2017-09-29 DIAGNOSIS — I480 Paroxysmal atrial fibrillation: Secondary | ICD-10-CM | POA: Diagnosis not present

## 2017-09-29 DIAGNOSIS — R161 Splenomegaly, not elsewhere classified: Secondary | ICD-10-CM | POA: Diagnosis not present

## 2017-09-29 DIAGNOSIS — G5600 Carpal tunnel syndrome, unspecified upper limb: Secondary | ICD-10-CM | POA: Diagnosis not present

## 2017-09-29 DIAGNOSIS — H409 Unspecified glaucoma: Secondary | ICD-10-CM | POA: Insufficient documentation

## 2017-09-29 DIAGNOSIS — Z79899 Other long term (current) drug therapy: Secondary | ICD-10-CM

## 2017-09-29 DIAGNOSIS — C9110 Chronic lymphocytic leukemia of B-cell type not having achieved remission: Secondary | ICD-10-CM

## 2017-09-29 DIAGNOSIS — Z8673 Personal history of transient ischemic attack (TIA), and cerebral infarction without residual deficits: Secondary | ICD-10-CM | POA: Insufficient documentation

## 2017-09-29 LAB — CBC WITH DIFFERENTIAL/PLATELET
BASOS ABS: 0.2 10*3/uL — AB (ref 0–0.1)
BASOS PCT: 0 %
Eosinophils Absolute: 0.5 10*3/uL (ref 0–0.7)
Eosinophils Relative: 1 %
HEMATOCRIT: 45.2 % (ref 40.0–52.0)
HEMOGLOBIN: 14.2 g/dL (ref 13.0–18.0)
LYMPHS PCT: 90 %
Lymphs Abs: 77.1 10*3/uL — ABNORMAL HIGH (ref 1.0–3.6)
MCH: 31.4 pg (ref 26.0–34.0)
MCHC: 31.5 g/dL — ABNORMAL LOW (ref 32.0–36.0)
MCV: 99.7 fL (ref 80.0–100.0)
MONO ABS: 1.5 10*3/uL — AB (ref 0.2–1.0)
Monocytes Relative: 2 %
NEUTROS ABS: 6 10*3/uL (ref 1.4–6.5)
NEUTROS PCT: 7 %
Platelets: 173 10*3/uL (ref 150–440)
RBC: 4.53 MIL/uL (ref 4.40–5.90)
RDW: 14.7 % — AB (ref 11.5–14.5)
WBC: 85.2 10*3/uL (ref 3.8–10.6)

## 2017-12-13 NOTE — Progress Notes (Signed)
3:52 PM   Nicholas Elliott 1922-06-01 [id number]  Referring provider: Tracie Harrier, MD [address] [facility] [place], [place] [address]  Chief Complaint  Patient presents with  . Urinary Incontinence    HPI: 84 yo WM with a history of hematuria presents today stated he is peeing on himself.      History of hematuria CT Urogram completed on 01/02/2016 noted no findings to explain his microscopic hematuria.  Somewhat enlarged, obstructive appearing prostate on cystoscopy on 07/2015,  He has not seen gross hematuria.    Patient states that he is continually peeing on himself.  He states he uses the restroom 3 times during the day.  He states he uses the restroom 3 times at night.  He states he wears 1 depends during the day and it is soaked.  He states he wears 1 depends at night and it is soaked.  I had a difficult time obtaining his history.  I am not certain if it is due to him being hard of hearing or the onset of dementia.  Patient denies any gross hematuria, dysuria or suprapubic/flank pain.  Patient denies any fevers, chills, nausea or vomiting.  His PVR today is 58 mL.    He states he is not taking the Myrbetriq medication even though it is listed in his MAR.  He also states he is not taking any other medication for his bladder.  PMH: Past Medical History:  Diagnosis Date  . A-fib ([place]) 04/08/2014  . AF (paroxysmal atrial fibrillation) ([place]) 04/11/2015  . Arthritis   . Arthritis   . Carpal tunnel syndrome 01/23/2014  . Cerebral vascular accident ([place]) 04/11/2015  . Cervical spinal stenosis 01/23/2014  . CLL (chronic lymphocytic leukemia) ([place])   . CLL (chronic lymphocytic leukemia) ([place])   . DDD (degenerative disc disease), cervical 01/23/2014  . DDD (degenerative disc disease), lumbar 09/11/2014  . Degenerative arthritis of lumbar spine 01/23/2014  . Episode of syncope 04/11/2015  . Facial droop 04/02/2015  . Forehead contusion 04/03/2015  . Glaucoma   .  Heart valve disease 04/08/2014  . High cholesterol   . High cholesterol   . HLD (hyperlipidemia) 04/08/2014  . Hyperglycemia 04/03/2015  . Left elbow contusion 04/03/2015  . Left hip pain 04/03/2015  . Migraine   . Migraine   . Nerve root pain 01/23/2014  . Neuritis or radiculitis due to rupture of lumbar intervertebral disc 09/11/2014  . Stroke ([place]) 04/02/2015  . TIA (transient ischemic attack)   . TIA (transient ischemic attack)     Surgical History: Past Surgical History:  Procedure Laterality Date  . HERNIA REPAIR    . SPINE SURGERY    . upper palate reparin      Home Medications:  Allergies as of 12/14/2017   No Known Allergies     Medication List        Accurate as of 12/14/17  3:52 PM. Always use your most recent med list.          aspirin EC 81 MG tablet Take 81 mg by mouth daily. Reported on 01/07/2016   CALCIUM HIGH POTENCY/VITAMIN D 600-200 MG-UNIT Tabs Generic drug:  Calcium Carbonate-Vitamin D Take by mouth.   CENTRUM SILVER ADULT 50+ PO Take by mouth daily.   chlorpheniramine 4 MG tablet Commonly known as:  CHLOR-TRIMETON Take 1 tablet (4 mg total) by mouth 2 (two) times daily as needed for allergies or rhinitis.   clobetasol 0.05 % external  solution Commonly known as:  TEMOVATE Reported on 01/07/2016   clopidogrel 75 MG tablet Commonly known as:  PLAVIX Take 75 mg by mouth at bedtime.   CRESTOR 20 MG tablet Generic drug:  rosuvastatin Take 20 mg by mouth at bedtime.   cyanocobalamin 100 MCG tablet Take 1 tablet (100 mcg total) by mouth daily.   diclofenac sodium 1 % Gel Commonly known as:  VOLTAREN Apply 2 g topically 4 (four) times daily.   digoxin 0.125 MG tablet Commonly known as:  LANOXIN Take 0.125 mg by mouth daily.   DULoxetine 30 MG capsule Commonly known as:  CYMBALTA Take 30 mg by mouth.   fluticasone 50 MCG/ACT nasal spray Commonly known as:  FLONASE Place into the nose.   furosemide 20 MG tablet Commonly known as:   LASIX Reported on 01/07/2016   gabapentin 400 MG capsule Commonly known as:  NEURONTIN 600 mg. Reported on 01/07/2016   Glucosamine-Chondroitin 250-200 MG Tabs Take by mouth.   hydrocortisone 2.5 % ointment Apply topically.   ketoconazole 2 % shampoo Commonly known as:  NIZORAL Use 2-3x weekly on face and scalp. Let sit for 2-3 min before rinsing.   meloxicam 7.5 MG tablet Commonly known as:  MOBIC Take by mouth.   metroNIDAZOLE 0.75 % gel Commonly known as:  METROGEL   mirabegron ER 25 MG Tb24 tablet Commonly known as:  MYRBETRIQ Take 1 tablet (25 mg total) by mouth daily.   moxifloxacin 400 MG tablet Commonly known as:  AVELOX   MULTI-VITAMINS Tabs Take by mouth.   OSTEO BI-FLEX JOINT SHIELD PO Take by mouth.   PHAZYME 125 MG chewable tablet Generic drug:  simethicone Chew 125 mg by mouth 2 (two) times daily. Reported on 01/07/2016   PROAIR HFA 108 (90 Base) MCG/ACT inhaler Generic drug:  albuterol Inhale into the lungs every 6 (six) hours as needed for wheezing or shortness of breath. Reported on 03/03/2016   traMADol 50 MG tablet Commonly known as:  ULTRAM       Allergies: No Known Allergies  Family History: Family History  Problem Relation Age of Onset  . Stroke Father   . Heart attack Brother   . Osteoarthritis Mother   . Cancer Sister   . Prostate cancer Neg Hx   . Kidney cancer Neg Hx   . Bladder Cancer Neg Hx     Social History:  reports that he has never smoked. He has never used smokeless tobacco. He reports that he does not drink alcohol or use drugs.  ROS: UROLOGY Frequent Urination?: No Hard to postpone urination?: No Burning/pain with urination?: No Get up at night to urinate?: Yes Leakage of urine?: Yes Urine stream starts and stops?: No Trouble starting stream?: No Do you have to strain to urinate?: No Blood in urine?: No Urinary tract infection?: No Sexually transmitted disease?: No Injury to kidneys or bladder?:  No Painful intercourse?: No Weak stream?: No Erection problems?: No Penile pain?: No  Gastrointestinal Nausea?: No Vomiting?: No Indigestion/heartburn?: No Diarrhea?: No Constipation?: No  Constitutional Fever: No Night sweats?: No Weight loss?: Yes Fatigue?: Yes  Skin Skin rash/lesions?: Yes Itching?: No  Eyes Blurred vision?: No Double vision?: No  Ears/Nose/Throat Sore throat?: No Sinus problems?: No  Hematologic/Lymphatic Swollen glands?: No Easy bruising?: Yes  Cardiovascular Leg swelling?: No Chest pain?: No  Respiratory Cough?: Yes Shortness of breath?: Yes  Endocrine Excessive thirst?: No  Musculoskeletal Back pain?: Yes Joint pain?: No  Neurological Headaches?: No Dizziness?: No  Psychologic Depression?: No Anxiety?: No  Physical Exam: BP 115/74 (BP Location: Right Arm, Patient Position: Sitting, Cuff Size: Normal)   Pulse (!) 116   Ht 6' (1.829 m)   Wt 156 lb 9.6 oz (71 kg)   BMI 21.24 kg/m   Constitutional: Well nourished. Alert and oriented, No acute distress. HEENT: [place] AT, moist mucus membranes. Trachea midline, no masses. Cardiovascular: No clubbing, cyanosis, or edema. Respiratory: Normal respiratory effort, no increased work of breathing. Skin: No rashes, bruises or suspicious lesions. Lymph: No cervical or inguinal adenopathy. Neurologic: Grossly intact, no focal deficits, moving all 4 extremities. Psychiatric: Normal mood and affect.   Laboratory Data: Lab Results  Component Value Date   WBC 85.2 (HH) 09/29/2017   HGB 14.2 09/29/2017   HCT 45.2 09/29/2017   MCV 99.7 09/29/2017   PLT 173 09/29/2017    I have reviewed the labs  Assessment & Plan:    1. History of hematuria:   CT Urogram did not demonstrated any etiology for the hematuria.  Cystoscopy noted an enlarged prostate.  He denies any gross hematuria.   Will continue to monitor for gross hematuria.    2. Urinary incontinence We will give samples of  Myrbetriq 25 mg daily He will follow-up in 3 weeks for IPSS and PVR   Return in about 3 weeks (around 01/04/2018) for IPSS and PVR.  These notes generated with voice recognition software. I apologize for typographical errors.  Zara Council, [facility] [address], [place], [place] [address] [phone]

## 2017-12-14 ENCOUNTER — Encounter: Payer: Self-pay | Admitting: Urology

## 2017-12-14 ENCOUNTER — Ambulatory Visit (INDEPENDENT_AMBULATORY_CARE_PROVIDER_SITE_OTHER): Payer: Medicare Other | Admitting: Urology

## 2017-12-14 VITALS — BP 115/74 | HR 116 | Ht 72.0 in | Wt 156.6 lb

## 2017-12-14 DIAGNOSIS — N3941 Urge incontinence: Secondary | ICD-10-CM | POA: Diagnosis not present

## 2017-12-14 DIAGNOSIS — Z87448 Personal history of other diseases of urinary system: Secondary | ICD-10-CM | POA: Diagnosis not present

## 2017-12-14 LAB — BLADDER SCAN AMB NON-IMAGING: SCAN RESULT: 58

## 2017-12-27 ENCOUNTER — Other Ambulatory Visit: Payer: Self-pay | Admitting: *Deleted

## 2017-12-27 DIAGNOSIS — C9110 Chronic lymphocytic leukemia of B-cell type not having achieved remission: Secondary | ICD-10-CM

## 2017-12-28 ENCOUNTER — Inpatient Hospital Stay: Payer: Medicare Other | Attending: Oncology

## 2017-12-28 DIAGNOSIS — G5600 Carpal tunnel syndrome, unspecified upper limb: Secondary | ICD-10-CM | POA: Diagnosis not present

## 2017-12-28 DIAGNOSIS — H409 Unspecified glaucoma: Secondary | ICD-10-CM | POA: Diagnosis not present

## 2017-12-28 DIAGNOSIS — C9110 Chronic lymphocytic leukemia of B-cell type not having achieved remission: Secondary | ICD-10-CM | POA: Diagnosis not present

## 2017-12-28 DIAGNOSIS — M5136 Other intervertebral disc degeneration, lumbar region: Secondary | ICD-10-CM | POA: Diagnosis not present

## 2017-12-28 DIAGNOSIS — D696 Thrombocytopenia, unspecified: Secondary | ICD-10-CM | POA: Insufficient documentation

## 2017-12-28 DIAGNOSIS — Z8673 Personal history of transient ischemic attack (TIA), and cerebral infarction without residual deficits: Secondary | ICD-10-CM | POA: Diagnosis not present

## 2017-12-28 DIAGNOSIS — Z79899 Other long term (current) drug therapy: Secondary | ICD-10-CM | POA: Diagnosis not present

## 2017-12-28 DIAGNOSIS — E7800 Pure hypercholesterolemia, unspecified: Secondary | ICD-10-CM | POA: Insufficient documentation

## 2017-12-28 DIAGNOSIS — R161 Splenomegaly, not elsewhere classified: Secondary | ICD-10-CM | POA: Diagnosis not present

## 2017-12-28 DIAGNOSIS — I480 Paroxysmal atrial fibrillation: Secondary | ICD-10-CM | POA: Diagnosis not present

## 2017-12-28 DIAGNOSIS — Z7982 Long term (current) use of aspirin: Secondary | ICD-10-CM | POA: Insufficient documentation

## 2017-12-28 DIAGNOSIS — M5030 Other cervical disc degeneration, unspecified cervical region: Secondary | ICD-10-CM | POA: Diagnosis not present

## 2017-12-28 LAB — CBC WITH DIFFERENTIAL/PLATELET
Basophils Absolute: 0.2 10*3/uL — ABNORMAL HIGH (ref 0–0.1)
Basophils Relative: 0 %
EOS PCT: 0 %
Eosinophils Absolute: 0.3 10*3/uL (ref 0–0.7)
HCT: 43.1 % (ref 40.0–52.0)
Hemoglobin: 13.9 g/dL (ref 13.0–18.0)
LYMPHS ABS: 71.4 10*3/uL — AB (ref 1.0–3.6)
LYMPHS PCT: 91 %
MCH: 32 pg (ref 26.0–34.0)
MCHC: 32.3 g/dL (ref 32.0–36.0)
MCV: 99 fL (ref 80.0–100.0)
MONO ABS: 1.7 10*3/uL — AB (ref 0.2–1.0)
Monocytes Relative: 2 %
Neutro Abs: 5.1 10*3/uL (ref 1.4–6.5)
Neutrophils Relative %: 7 %
PLATELETS: 149 10*3/uL — AB (ref 150–440)
RBC: 4.35 MIL/uL — ABNORMAL LOW (ref 4.40–5.90)
RDW: 15 % — AB (ref 11.5–14.5)
WBC: 78.7 10*3/uL (ref 3.8–10.6)

## 2018-01-03 ENCOUNTER — Ambulatory Visit: Payer: Medicare Other | Admitting: Urology

## 2018-01-08 ENCOUNTER — Emergency Department
Admission: EM | Admit: 2018-01-08 | Discharge: 2018-01-08 | Disposition: A | Discharge status: Home / Self Care | Payer: Medicare Other | Attending: Emergency Medicine | Admitting: Emergency Medicine

## 2018-01-08 ENCOUNTER — Encounter: Payer: Self-pay | Admitting: Emergency Medicine

## 2018-01-08 ENCOUNTER — Other Ambulatory Visit: Payer: Self-pay

## 2018-01-08 DIAGNOSIS — Z79899 Other long term (current) drug therapy: Secondary | ICD-10-CM | POA: Diagnosis not present

## 2018-01-08 DIAGNOSIS — Z7982 Long term (current) use of aspirin: Secondary | ICD-10-CM | POA: Insufficient documentation

## 2018-01-08 DIAGNOSIS — M25562 Pain in left knee: Secondary | ICD-10-CM | POA: Diagnosis present

## 2018-01-08 DIAGNOSIS — I10 Essential (primary) hypertension: Secondary | ICD-10-CM | POA: Diagnosis not present

## 2018-01-08 DIAGNOSIS — M62838 Other muscle spasm: Secondary | ICD-10-CM | POA: Diagnosis not present

## 2018-01-08 NOTE — ED Provider Notes (Signed)
[facility] Emergency Department Provider Note  ____________________________________________  Time seen: Approximately 11:20 PM  I have reviewed the triage vital signs and the nursing notes.   HISTORY  Chief Complaint Knee Pain    HPI Nicholas Elliott is a 84 y.o. male who presents the emergency department complaining of left knee pain.  Patient reports that he has nontraumatic left knee pain that has resolved.  Patient was laying in bed this evening, felt increasing knee pain.  When he felt his knee, he noticed a "knot" in the distal quadriceps region.  Patient reports that this is resolved.  The pain has also resolved.  He is able to ambulate with no difficulties.  Again, no trauma.  Patient denies any swelling, erythema to the affected joint.  No history of gout.  Patient denies any other complaints at this time of hip pain, radicular symptoms, lower back pain.  Past Medical History:  Diagnosis Date  . A-fib ([place]) 04/08/2014  . AF (paroxysmal atrial fibrillation) ([place]) 04/11/2015  . Arthritis   . Arthritis   . Carpal tunnel syndrome 01/23/2014  . Cerebral vascular accident ([place]) 04/11/2015  . Cervical spinal stenosis 01/23/2014  . CLL (chronic lymphocytic leukemia) ([place])   . CLL (chronic lymphocytic leukemia) ([place])   . DDD (degenerative disc disease), cervical 01/23/2014  . DDD (degenerative disc disease), lumbar 09/11/2014  . Degenerative arthritis of lumbar spine 01/23/2014  . Episode of syncope 04/11/2015  . Facial droop 04/02/2015  . Forehead contusion 04/03/2015  . Glaucoma   . Heart valve disease 04/08/2014  . High cholesterol   . High cholesterol   . HLD (hyperlipidemia) 04/08/2014  . Hyperglycemia 04/03/2015  . Left elbow contusion 04/03/2015  . Left hip pain 04/03/2015  . Migraine   . Migraine   . Nerve root pain 01/23/2014  . Neuritis or radiculitis due to rupture of lumbar intervertebral disc 09/11/2014  . Stroke ([place]) 04/02/2015  . TIA (transient ischemic  attack)   . TIA (transient ischemic attack)     Patient Active Problem List   Diagnosis Date Noted  . Moderate aortic valve stenosis 04/12/2017  . Atherosclerotic peripheral vascular disease with intermittent claudication ([place]) 12/29/2015  . BPH with obstruction/lower urinary tract symptoms 12/21/2015  . Microscopic hematuria 12/21/2015  . Urge incontinence 12/21/2015  . Benign essential HTN 06/30/2015  . Cerebral vascular accident ([place]) 04/11/2015  . AF (paroxysmal atrial fibrillation) ([place]) 04/11/2015  . Episode of syncope 04/11/2015  . Cerebrovascular accident (CVA) ([place]) 04/11/2015  . Paroxysmal atrial fibrillation ([place]) 04/11/2015  . Left hip pain 04/03/2015  . Forehead contusion 04/03/2015  . Left elbow contusion 04/03/2015  . Hyperglycemia 04/03/2015  . Facial droop 04/02/2015  . Stroke ([place]) 04/02/2015  . High cholesterol   . TIA (transient ischemic attack)   . Arthritis   . Migraine   . CLL (chronic lymphocytic leukemia) ([place])   . Chronic lymphocytic leukemia ([place]) 09/16/2014  . DDD (degenerative disc disease), lumbar 09/11/2014  . Neuritis or radiculitis due to rupture of lumbar intervertebral disc 09/11/2014  . A-fib ([place]) 04/08/2014  . Heart valve disease 04/08/2014  . HLD (hyperlipidemia) 04/08/2014  . Atrial fibrillation ([place]) 04/08/2014  . Carpal tunnel syndrome 01/23/2014  . DDD (degenerative disc disease), cervical 01/23/2014  . Cervical spinal stenosis 01/23/2014  . Degenerative arthritis of lumbar spine 01/23/2014  . Nerve root pain 01/23/2014  . Degeneration of intervertebral disc of cervical region 01/23/2014    Past Surgical History:  Procedure Laterality Date  .  HERNIA REPAIR    . SPINE SURGERY    . upper palate reparin      Prior to Admission medications   Medication Sig Start Date End Date Taking? Authorizing Provider  albuterol (PROAIR HFA) 108 (90 BASE) MCG/ACT inhaler Inhale into the lungs every 6 (six) hours as needed for wheezing or  shortness of breath. Reported on 03/03/2016 10/07/14 10/07/15  [provider]  aspirin EC 81 MG tablet Take 81 mg by mouth daily. Reported on 01/07/2016    [provider]  Calcium Carbonate-Vitamin D (CALCIUM HIGH POTENCY/VITAMIN D) 600-200 MG-UNIT TABS Take by mouth.    [provider]  chlorpheniramine (CHLOR-TRIMETON) 4 MG tablet Take 1 tablet (4 mg total) by mouth 2 (two) times daily as needed for allergies or rhinitis. 08/30/15   Beers, Pierce Crane, PA-C  clobetasol (TEMOVATE) 0.05 % external solution Reported on 01/07/2016 01/06/16   [provider]  clopidogrel (PLAVIX) 75 MG tablet Take 75 mg by mouth at bedtime.     [provider]  diclofenac sodium (VOLTAREN) 1 % GEL Apply 2 g topically 4 (four) times daily. 03/17/17   Nance Pear, MD  digoxin (LANOXIN) 0.125 MG tablet Take 0.125 mg by mouth daily.     [provider]  DULoxetine (CYMBALTA) 30 MG capsule Take 30 mg by mouth.    [provider]  fluticasone (FLONASE) 50 MCG/ACT nasal spray Place into the nose. 11/22/16 11/22/17  [provider]  furosemide (LASIX) 20 MG tablet Reported on 01/07/2016 03/17/15   [provider]  gabapentin (NEURONTIN) 400 MG capsule 600 mg. Reported on 01/07/2016 05/27/15   [provider]  Glucosamine-Chondroitin 250-200 MG TABS Take by mouth.    [provider]  hydrocortisone 2.5 % ointment Apply topically. 09/29/16   [provider]  ketoconazole (NIZORAL) 2 % shampoo Use 2-3x weekly on face and scalp. Let sit for 2-3 min before rinsing. 09/29/16   [provider]  metroNIDAZOLE (METROGEL) 0.75 % gel  12/06/17   [provider]  mirabegron ER (MYRBETRIQ) 25 MG TB24 tablet Take 1 tablet (25 mg total) by mouth daily. 09/16/15   Hollice Espy, MD  Misc Natural Products (OSTEO BI-FLEX JOINT SHIELD PO) Take by mouth.    [provider]  moxifloxacin (AVELOX) 400 MG tablet  12/12/17    [provider]  Multiple Vitamin (MULTI-VITAMINS) TABS Take by mouth.    [provider]  Multiple Vitamins-Minerals (CENTRUM SILVER ADULT 50+ PO) Take by mouth daily.    [provider]  rosuvastatin (CRESTOR) 20 MG tablet Take 20 mg by mouth at bedtime. 02/18/15   [provider]  simethicone (PHAZYME) 125 MG chewable tablet Chew 125 mg by mouth 2 (two) times daily. Reported on 01/07/2016    [provider]  traMADol Veatrice [place]) 50 MG tablet  03/22/15   [provider]  vitamin B-12 100 MCG tablet Take 1 tablet (100 mcg total) by mouth daily. 04/08/15   Geradine Girt, DO    Allergies Patient has no known allergies.  Family History  Problem Relation Age of Onset  . Stroke Father   . Heart attack Brother   . Osteoarthritis Mother   . Cancer Sister   . Prostate cancer Neg Hx   . Kidney cancer Neg Hx   . Bladder Cancer Neg Hx     Social History Social History   Tobacco Use  . Smoking status: Never Smoker  . Smokeless tobacco: Never Used  Substance Use Topics  . Alcohol use: No  . Drug use: No     Review of Systems  Constitutional: No fever/chills Eyes: No visual changes.  Cardiovascular: no chest pain. Respiratory: no cough. No SOB. Gastrointestinal: No abdominal pain.  No nausea, no vomiting.   Musculoskeletal: Positive for left knee pain Skin: Negative for rash, abrasions, lacerations, ecchymosis. Neurological: Negative for headaches, focal weakness or numbness. 10-point ROS otherwise negative.  ____________________________________________   PHYSICAL EXAM:  VITAL SIGNS: ED Triage Vitals [01/08/18 2218]  Enc Vitals Group     BP 131/77     Pulse Rate 79     Resp 18     Temp 98 F (36.7 C)     Temp Source Oral     SpO2 97 %     Weight 175 lb (79.4 kg)     Height 6' (1.829 m)     Head Circumference      Peak Flow      Pain Score 0     Pain Loc      Pain Edu?      Excl. in [place]?      Constitutional:  Alert and oriented. Well appearing and in no acute distress. Eyes: Conjunctivae are normal. PERRL. EOMI. Head: Atraumatic. Neck: No stridor.    Cardiovascular: Normal rate, regular rhythm. Normal S1 and S2.  Good peripheral circulation. Respiratory: Normal respiratory effort without tachypnea or retractions. Lungs CTAB. Good air entry to the bases with no decreased or absent breath sounds. Musculoskeletal: Full range of motion to all extremities. No gross deformities appreciated.  Patient is ambulatory with no difficulty.  Visualization of the left knee is unremarkable compared to unaffected extremity.  No edema, erythema, warmth to palpation.  No ballottement.  Patient is nontender to palpation over the osseous and muscular structures of the left knee.  Palpation of the quadriceps and patellar tendon is unremarkable.  Varus, valgus, Lachman's, McMurray's is negative.  Dorsalis pedis pulse intact distally.  Sensation intact distally. Neurologic:  Normal speech and language. No gross focal neurologic deficits are appreciated.  Skin:  Skin is warm, dry and intact. No rash noted. Psychiatric: Mood and affect are normal. Speech and behavior are normal. Patient exhibits appropriate insight and judgement.   ____________________________________________   LABS (all labs ordered are listed, but only abnormal results are displayed)  Labs Reviewed - No data to display ____________________________________________  EKG   ____________________________________________  RADIOLOGY   No results found.  ____________________________________________    PROCEDURES  Procedure(s) performed:    Procedures    Medications - No data to display   ____________________________________________   INITIAL IMPRESSION / ASSESSMENT AND PLAN / ED COURSE  Pertinent labs & imaging results that were available during my care of the patient were reviewed by me and considered in my medical decision making (see  chart for details).  Review of the [place] CSRS was performed in accordance of the [place] prior to dispensing any controlled drugs.     Patient's diagnosis is consistent with left knee pain, quadriceps tendon spasm.  Patient presents with acute left knee pain that has resolved.  This was nontraumatic in nature.  Symptoms had resolved prior to evaluation in the emergency department.  When questioning, patient reports that he had a "knot" in the region of the quadriceps tendon.  This is palpated with no deficits.  I suspect that patient likely had quadriceps spasm that has resolved.  Exam was otherwise reassuring.  At this time, there is  no indication for labs or imaging patient is symptom-free and exam is reassuring.  Differential included gout, septic joint, arthritis, joint effusion, ligament rupture, fracture..  Tylenol as needed.  Patient is to drink plenty of fluids over the next several days.  She will follow-up with primary care as needed.  Patient is given ED precautions to return to the ED for any worsening or new symptoms.     ____________________________________________  FINAL CLINICAL IMPRESSION(S) / ED DIAGNOSES  Final diagnoses:  Acute pain of left knee  Muscle spasm      NEW MEDICATIONS STARTED DURING THIS VISIT:  ED Discharge Orders    None          This chart was dictated using voice recognition software/Dragon. Despite best efforts to proofread, errors can occur which can change the meaning. Any change was purely unintentional.    Darletta Moll, PA-C 01/08/18 2336    Nance Pear, MD 01/08/18 [phone]

## 2018-01-08 NOTE — ED Triage Notes (Signed)
Pt reports left knee pain earlier today, pain worse when laying in bed; currently pt is sitting in wheelchair with knees bent at 90 deg without pain or difficulty; pt says earlier today it felt like there was a knot above his knee; nothing palpated at this time; no swelling noted; no redness noted; pt denies pain with ambulation;

## 2018-01-31 ENCOUNTER — Telehealth: Payer: Self-pay | Admitting: Urology

## 2018-01-31 NOTE — Progress Notes (Signed)
4:05 PM   Nicholas Elliott 07-12-1922 [id number]  Referring provider: Tracie Harrier, MD [address] [facility] [place], [place] [address]  Chief Complaint  Patient presents with  . Follow-up    HPI: 84 yo WM with a history of hematuria presents today for follow up.       History of hematuria CT Urogram completed on 01/02/2016 noted no findings to explain his microscopic hematuria.  Somewhat enlarged, obstructive appearing prostate on cystoscopy on 07/2015,  He has not seen gross hematuria.    Incontinence Patient states that he is continually peeing on himself.  He states he uses the restroom 3 times during the day.  He states he uses the restroom 3 times at night.  He states he wears 1 depends during the day and it is soaked.  He states he wears 1 depends at night and it is soaked.  I had a difficult time obtaining his history.  I am not certain if it is due to him being hard of hearing or the onset of dementia.  Patient denies any gross hematuria, dysuria or suprapubic/flank pain.  Patient denies any fevers, chills, nausea or vomiting.  His PVR today is 58 mL.  He states he is not taking the Myrbetriq medication even though it is listed in his MAR.  He also states he is not taking any other medication for his bladder.    He has taken the samples of the Myrbetriq 50 mg daily.  He did not find that it helped at all.    IPSS score: 2/3  PVR: 50 mL     Major complaint(s): incontinence x for years. Denies any dysuria, hematuria or suprapubic pain.   Denies any recent fevers, chills, nausea or vomiting.  IPSS    Row Name 02/01/18 1500         International Prostate Symptom Score   How often have you had the sensation of not emptying your bladder?  Not at All     How often have you had to urinate less than every two hours?  Less than 1 in 5 times     How often have you found you stopped and started again several times when you urinated?  Not at All     How  often have you found it difficult to postpone urination?  Not at All     How often have you had a weak urinary stream?  Not at All     How often have you had to strain to start urination?  Not at All     How many times did you typically get up at night to urinate?  1 Time     Total IPSS Score  2       Quality of Life due to urinary symptoms   If you were to spend the rest of your life with your urinary condition just the way it is now how would you feel about that?  Mixed        Score:  1-7 Mild 8-19 Moderate 20-35 Severe   PMH: Past Medical History:  Diagnosis Date  . A-fib ([place]) 04/08/2014  . AF (paroxysmal atrial fibrillation) ([place]) 04/11/2015  . Arthritis   . Arthritis   . Carpal tunnel syndrome 01/23/2014  . Cerebral vascular accident ([place]) 04/11/2015  . Cervical spinal stenosis 01/23/2014  . CLL (chronic lymphocytic leukemia) ([place])   . CLL (chronic lymphocytic leukemia) ([place])   . DDD (degenerative disc  disease), cervical 01/23/2014  . DDD (degenerative disc disease), lumbar 09/11/2014  . Degenerative arthritis of lumbar spine 01/23/2014  . Episode of syncope 04/11/2015  . Facial droop 04/02/2015  . Forehead contusion 04/03/2015  . Glaucoma   . Heart valve disease 04/08/2014  . High cholesterol   . High cholesterol   . HLD (hyperlipidemia) 04/08/2014  . Hyperglycemia 04/03/2015  . Left elbow contusion 04/03/2015  . Left hip pain 04/03/2015  . Migraine   . Migraine   . Nerve root pain 01/23/2014  . Neuritis or radiculitis due to rupture of lumbar intervertebral disc 09/11/2014  . Stroke ([place]) 04/02/2015  . TIA (transient ischemic attack)   . TIA (transient ischemic attack)     Surgical History: Past Surgical History:  Procedure Laterality Date  . HERNIA REPAIR    . SPINE SURGERY    . upper palate reparin      Home Medications:  Allergies as of 02/01/2018      Reactions   Moxifloxacin Other (See Comments)   Patient's dementia symptoms increased.      Medication List          Accurate as of 02/01/18  4:05 PM. Always use your most recent med list.          aspirin EC 81 MG tablet Take 81 mg by mouth daily. Reported on 01/07/2016   CALCIUM HIGH POTENCY/VITAMIN D 600-200 MG-UNIT Tabs Generic drug:  Calcium Carbonate-Vitamin D Take by mouth.   CENTRUM SILVER ADULT 50+ PO Take by mouth daily.   chlorpheniramine 4 MG tablet Commonly known as:  CHLOR-TRIMETON Take 1 tablet (4 mg total) by mouth 2 (two) times daily as needed for allergies or rhinitis.   clobetasol 0.05 % external solution Commonly known as:  TEMOVATE Reported on 01/07/2016   clopidogrel 75 MG tablet Commonly known as:  PLAVIX Take 75 mg by mouth at bedtime.   CRESTOR 20 MG tablet Generic drug:  rosuvastatin Take 20 mg by mouth at bedtime.   cyanocobalamin 100 MCG tablet Take 1 tablet (100 mcg total) by mouth daily.   diclofenac sodium 1 % Gel Commonly known as:  VOLTAREN Apply 2 g topically 4 (four) times daily.   digoxin 0.125 MG tablet Commonly known as:  LANOXIN Take 0.125 mg by mouth daily.   DULoxetine 30 MG capsule Commonly known as:  CYMBALTA Take 30 mg by mouth.   fesoterodine 4 MG Tb24 tablet Commonly known as:  TOVIAZ Take 1 tablet (4 mg total) by mouth daily.   fluticasone 50 MCG/ACT nasal spray Commonly known as:  FLONASE Place into the nose.   furosemide 20 MG tablet Commonly known as:  LASIX Reported on 01/07/2016   gabapentin 400 MG capsule Commonly known as:  NEURONTIN 600 mg. Reported on 01/07/2016   gentamicin ointment 0.1 % Commonly known as:  GARAMYCIN   Glucosamine-Chondroitin 250-200 MG Tabs Take by mouth.   hydrocortisone 2.5 % ointment Apply topically.   ketoconazole 2 % shampoo Commonly known as:  NIZORAL Use 2-3x weekly on face and scalp. Let sit for 2-3 min before rinsing.   metroNIDAZOLE 0.75 % gel Commonly known as:  METROGEL   mirabegron ER 25 MG Tb24 tablet Commonly known as:  MYRBETRIQ Take 1 tablet (25 mg total) by  mouth daily.   moxifloxacin 400 MG tablet Commonly known as:  AVELOX   MULTI-VITAMINS Tabs Take by mouth.   OSTEO BI-FLEX JOINT SHIELD PO Take by mouth.   PHAZYME 125 MG chewable tablet Generic  drug:  simethicone Chew 125 mg by mouth 2 (two) times daily. Reported on 01/07/2016   PROAIR HFA 108 (90 Base) MCG/ACT inhaler Generic drug:  albuterol Inhale into the lungs every 6 (six) hours as needed for wheezing or shortness of breath. Reported on 03/03/2016   traMADol 50 MG tablet Commonly known as:  ULTRAM   VESICARE 10 MG tablet Generic drug:  solifenacin Take by mouth.       Allergies:  Allergies  Allergen Reactions  . Moxifloxacin Other (See Comments)    Patient's dementia symptoms increased.    Family History: Family History  Problem Relation Age of Onset  . Stroke Father   . Heart attack Brother   . Osteoarthritis Mother   . Cancer Sister   . Prostate cancer Neg Hx   . Kidney cancer Neg Hx   . Bladder Cancer Neg Hx     Social History:  reports that he has never smoked. He has never used smokeless tobacco. He reports that he does not drink alcohol or use drugs.  ROS: UROLOGY Frequent Urination?: No Hard to postpone urination?: No Burning/pain with urination?: No Get up at night to urinate?: No Leakage of urine?: No Urine stream starts and stops?: No Trouble starting stream?: No Do you have to strain to urinate?: No Blood in urine?: No Urinary tract infection?: No Sexually transmitted disease?: No Injury to kidneys or bladder?: No Painful intercourse?: No Weak stream?: No Erection problems?: No Penile pain?: No  Gastrointestinal Nausea?: No Vomiting?: No Indigestion/heartburn?: No Diarrhea?: No Constipation?: No  Constitutional Fever: No Night sweats?: No Weight loss?: Yes Fatigue?: No  Skin Skin rash/lesions?: Yes Itching?: No  Eyes Blurred vision?: No Double vision?: No  Ears/Nose/Throat Sore throat?: No Sinus problems?:  No  Hematologic/Lymphatic Swollen glands?: No Easy bruising?: Yes  Cardiovascular Leg swelling?: No Chest pain?: No  Respiratory Cough?: No Shortness of breath?: Yes  Endocrine Excessive thirst?: No  Musculoskeletal Back pain?: No Joint pain?: No  Neurological Headaches?: No Dizziness?: No  Psychologic Depression?: Yes Anxiety?: No  Physical Exam: BP 136/80 (BP Location: Right Arm, Patient Position: Sitting, Cuff Size: Normal)   Pulse 80   Ht 6' (1.829 m)   Wt 156 lb (70.8 kg)   SpO2 99%   BMI 21.16 kg/m   Constitutional: Well nourished. Alert and oriented, No acute distress. HEENT: [place] AT, moist mucus membranes. Trachea midline, no masses. Cardiovascular: No clubbing, cyanosis, or edema. Respiratory: Normal respiratory effort, no increased work of breathing. Skin: No rashes, bruises or suspicious lesions. Lymph: No cervical or inguinal adenopathy. Neurologic: Grossly intact, no focal deficits, moving all 4 extremities. Psychiatric: Normal mood and affect.   Laboratory Data: Lab Results  Component Value Date   WBC 78.7 (HH) 12/28/2017   HGB 13.9 12/28/2017   HCT 43.1 12/28/2017   MCV 99.0 12/28/2017   PLT 149 (L) 12/28/2017    I have reviewed the labs  Assessment & Plan:    1. History of hematuria:   CT Urogram did not demonstrated any etiology for the hematuria.  Cystoscopy noted an enlarged prostate.  He denies any gross hematuria.   Will continue to monitor for gross hematuria.    2. Urinary incontinence He will have a trial of Toviaz 4 mg dail He will follow-up in 3 weeks for IPSS and PVR   Return in about 1 month (around 03/04/2018) for IPSS and PVR.  These notes generated with voice recognition software. I apologize for typographical errors.  Henya Aguallo,  PA-C  [facility] [address] [place], [place] [address] [phone]

## 2018-02-01 ENCOUNTER — Ambulatory Visit: Payer: Medicare Other | Admitting: Urology

## 2018-02-01 ENCOUNTER — Encounter: Payer: Self-pay | Admitting: Urology

## 2018-02-01 VITALS — BP 136/80 | HR 80 | Ht 72.0 in | Wt 156.0 lb

## 2018-02-01 DIAGNOSIS — N3941 Urge incontinence: Secondary | ICD-10-CM | POA: Diagnosis not present

## 2018-02-01 DIAGNOSIS — Z87448 Personal history of other diseases of urinary system: Secondary | ICD-10-CM | POA: Diagnosis not present

## 2018-02-01 LAB — BLADDER SCAN AMB NON-IMAGING

## 2018-02-01 MED ORDER — FESOTERODINE FUMARATE ER 4 MG PO TB24: 4.0000 mg | ORAL_TABLET | Freq: Every day | ORAL | 3 refills | Status: DC

## 2018-02-07 ENCOUNTER — Other Ambulatory Visit: Payer: Self-pay | Admitting: Internal Medicine

## 2018-02-07 DIAGNOSIS — R413 Other amnesia: Secondary | ICD-10-CM

## 2018-02-07 DIAGNOSIS — Z9181 History of falling: Secondary | ICD-10-CM

## 2018-03-03 ENCOUNTER — Other Ambulatory Visit: Payer: Self-pay | Admitting: Internal Medicine

## 2018-03-03 DIAGNOSIS — Z9181 History of falling: Secondary | ICD-10-CM

## 2018-03-03 DIAGNOSIS — R413 Other amnesia: Secondary | ICD-10-CM

## 2018-03-03 DIAGNOSIS — Z8673 Personal history of transient ischemic attack (TIA), and cerebral infarction without residual deficits: Secondary | ICD-10-CM

## 2018-03-08 ENCOUNTER — Ambulatory Visit
Admission: RE | Admit: 2018-03-08 | Discharge: 2018-03-08 | Disposition: A | Discharge status: Home / Self Care | Payer: Medicare Other | Source: Ambulatory Visit | Attending: Internal Medicine | Admitting: Internal Medicine

## 2018-03-08 DIAGNOSIS — H748X3 Other specified disorders of middle ear and mastoid, bilateral: Secondary | ICD-10-CM | POA: Diagnosis not present

## 2018-03-08 DIAGNOSIS — Z8673 Personal history of transient ischemic attack (TIA), and cerebral infarction without residual deficits: Secondary | ICD-10-CM | POA: Diagnosis not present

## 2018-03-08 DIAGNOSIS — R413 Other amnesia: Secondary | ICD-10-CM | POA: Diagnosis not present

## 2018-03-08 DIAGNOSIS — I63531 Cerebral infarction due to unspecified occlusion or stenosis of right posterior cerebral artery: Secondary | ICD-10-CM | POA: Diagnosis not present

## 2018-03-08 DIAGNOSIS — Z9181 History of falling: Secondary | ICD-10-CM | POA: Diagnosis not present

## 2018-03-08 DIAGNOSIS — G319 Degenerative disease of nervous system, unspecified: Secondary | ICD-10-CM | POA: Diagnosis not present

## 2018-03-11 NOTE — Progress Notes (Signed)
11:57 AM   Nicholas Elliott 01-07-1922 [id number]  Referring provider: [name], MD [place] Nicholas Elliott, [place] [address]  Chief Complaint  Patient presents with  . Urinary Incontinence    71month    HPI: 84 yo WM with a history of hematuria and incontinence presents today for follow up.       History of hematuria CT Urogram completed on 01/02/2016 noted no findings to explain his microscopic hematuria.  Somewhat enlarged, obstructive appearing prostate on cystoscopy on 07/2015,  He has not seen gross hematuria.    Incontinence He has taken the samples of the Myrbetriq 50 mg daily.  He did not find that it helped at all.  He states the Toviaz 8 mg daily was not effective.  He states he feels to urinate and the urine just pours out.    I PSS score is 21/3.  PVR today is 35 mL.  Previous IPSS score: 2/3  PVR: 50 mL   His previous PVR was 58 mL.    Major complaint(s): frequency, urgency and incontinence x for years. Denies any dysuria, hematuria or suprapubic pain.   Denies any recent fevers, chills, nausea or vomiting.  IPSS    Row Name 02/01/18 1500 03/13/18 1100       International Prostate Symptom Score   How often have you had the sensation of not emptying your bladder?  Not at All  More than half the time    How often have you had to urinate less than every two hours?  Less than 1 in 5 times  About half the time    How often have you found you stopped and started again several times when you urinated?  Not at All  Less than half the time    How often have you found it difficult to postpone urination?  Not at All  About half the time    How often have you had a weak urinary stream?  Not at All  More than half the time    How often have you had to strain to start urination?  Not at All  About half the time    How many times did you typically get up at night to urinate?  1 Time  2 Times    Total IPSS Score  2  21      Quality of Life due to urinary symptoms   If you were to spend the rest of your life with your urinary condition just the way it is now how would you feel about that?  Mixed  Mixed       Score:  1-7 Mild 8-19 Moderate 20-35 Severe   PMH: Past Medical History:  Diagnosis Date  . A-fib ([place]) 04/08/2014  . AF (paroxysmal atrial fibrillation) ([place]) 04/11/2015  . Arthritis   . Arthritis   . Carpal tunnel syndrome 01/23/2014  . Cerebral vascular accident ([place]) 04/11/2015  . Cervical spinal stenosis 01/23/2014  . CLL (chronic lymphocytic leukemia) ([place])   . CLL (chronic lymphocytic leukemia) ([place])   . DDD (degenerative disc disease), cervical 01/23/2014  . DDD (degenerative disc disease), lumbar 09/11/2014  . Degenerative arthritis of lumbar spine 01/23/2014  . Episode of syncope 04/11/2015  . Facial droop 04/02/2015  . Forehead contusion 04/03/2015  . Glaucoma   . Heart valve disease 04/08/2014  . High cholesterol   . High cholesterol   . HLD (hyperlipidemia) 04/08/2014  . Hyperglycemia 04/03/2015  . Left elbow  contusion 04/03/2015  . Left hip pain 04/03/2015  . Migraine   . Migraine   . Nerve root pain 01/23/2014  . Neuritis or radiculitis due to rupture of lumbar intervertebral disc 09/11/2014  . Stroke ([place]) 04/02/2015  . TIA (transient ischemic attack)   . TIA (transient ischemic attack)     Surgical History: Past Surgical History:  Procedure Laterality Date  . HERNIA REPAIR    . SPINE SURGERY    . upper palate reparin      Home Medications:  Allergies as of 03/13/2018      Reactions   Moxifloxacin Other (See Comments)   Patient's dementia symptoms increased.      Medication List        Accurate as of 03/13/18 11:57 AM. Always use your most recent med list.          aspirin EC 81 MG tablet Take 81 mg by mouth daily. Reported on 01/07/2016   CALCIUM HIGH POTENCY/VITAMIN D 600-200 MG-UNIT Tabs Generic drug:  Calcium Carbonate-Vitamin D Take by mouth.   CENTRUM SILVER ADULT 50+ PO Take by mouth daily.     chlorpheniramine 4 MG tablet Commonly known as:  CHLOR-TRIMETON Take 1 tablet (4 mg total) by mouth 2 (two) times daily as needed for allergies or rhinitis.   clobetasol 0.05 % external solution Commonly known as:  TEMOVATE Reported on 01/07/2016   clopidogrel 75 MG tablet Commonly known as:  PLAVIX Take 75 mg by mouth at bedtime.   CRESTOR 20 MG tablet Generic drug:  rosuvastatin Take 20 mg by mouth at bedtime.   cyanocobalamin 100 MCG tablet Take 1 tablet (100 mcg total) by mouth daily.   diclofenac sodium 1 % Gel Commonly known as:  VOLTAREN Apply 2 g topically 4 (four) times daily.   digoxin 0.125 MG tablet Commonly known as:  LANOXIN Take 0.125 mg by mouth daily.   DULoxetine 30 MG capsule Commonly known as:  CYMBALTA Take 30 mg by mouth.   fesoterodine 4 MG Tb24 tablet Commonly known as:  TOVIAZ Take 1 tablet (4 mg total) by mouth daily.   fluticasone 50 MCG/ACT nasal spray Commonly known as:  FLONASE Place into the nose.   furosemide 20 MG tablet Commonly known as:  LASIX Reported on 01/07/2016   gabapentin 400 MG capsule Commonly known as:  NEURONTIN 600 mg. Reported on 01/07/2016   gentamicin ointment 0.1 % Commonly known as:  GARAMYCIN   Glucosamine-Chondroitin 250-200 MG Tabs Take by mouth.   hydrocortisone 2.5 % ointment Apply topically.   ketoconazole 2 % shampoo Commonly known as:  NIZORAL Use 2-3x weekly on face and scalp. Let sit for 2-3 min before rinsing.   metroNIDAZOLE 0.75 % gel Commonly known as:  METROGEL   mirabegron ER 25 MG Tb24 tablet Commonly known as:  MYRBETRIQ Take 1 tablet (25 mg total) by mouth daily.   moxifloxacin 400 MG tablet Commonly known as:  AVELOX   MULTI-VITAMINS Tabs Take by mouth.   OSTEO BI-FLEX JOINT SHIELD PO Take by mouth.   PHAZYME 125 MG chewable tablet Generic drug:  simethicone Chew 125 mg by mouth 2 (two) times daily. Reported on 01/07/2016   PROAIR HFA 108 (90 Base) MCG/ACT  inhaler Generic drug:  albuterol Inhale into the lungs every 6 (six) hours as needed for wheezing or shortness of breath. Reported on 03/03/2016   traMADol 50 MG tablet Commonly known as:  ULTRAM   VESICARE 10 MG tablet Generic drug:  solifenacin Take  by mouth.       Allergies:  Allergies  Allergen Reactions  . Moxifloxacin Other (See Comments)    Patient's dementia symptoms increased.    Family History: Family History  Problem Relation Age of Onset  . Stroke Father   . Heart attack Brother   . Osteoarthritis Mother   . Cancer Sister   . Prostate cancer Neg Hx   . Kidney cancer Neg Hx   . Bladder Cancer Neg Hx     Social History:  reports that he has never smoked. He has never used smokeless tobacco. He reports that he does not drink alcohol or use drugs.  ROS: UROLOGY Frequent Urination?: Yes Hard to postpone urination?: No Burning/pain with urination?: No Get up at night to urinate?: Yes Leakage of urine?: No Urine stream starts and stops?: No Trouble starting stream?: No Do you have to strain to urinate?: No Blood in urine?: No Urinary tract infection?: No Sexually transmitted disease?: No Injury to kidneys or bladder?: No Painful intercourse?: No Weak stream?: No Erection problems?: No Penile pain?: No  Gastrointestinal Nausea?: No Vomiting?: No Indigestion/heartburn?: No Diarrhea?: No Constipation?: No  Constitutional Fever: No Night sweats?: No Weight loss?: Yes Fatigue?: Yes  Skin Skin rash/lesions?: No Itching?: No  Eyes Blurred vision?: No Double vision?: Yes  Ears/Nose/Throat Sore throat?: No Sinus problems?: No  Hematologic/Lymphatic Swollen glands?: No Easy bruising?: Yes  Cardiovascular Leg swelling?: No Chest pain?: Yes  Respiratory Cough?: Yes Shortness of breath?: Yes  Endocrine Excessive thirst?: No  Musculoskeletal Back pain?: Yes Joint pain?: No  Neurological Headaches?: Yes Dizziness?:  No  Psychologic Depression?: Yes Anxiety?: No  Physical Exam: BP 108/65   Pulse (!) 55   Ht 6' (1.829 m)   Wt 158 lb (71.7 kg)   BMI 21.43 kg/m   Constitutional: Elderly.  Alert and oriented, No acute distress.  Very hard of hearing.   HEENT: [place] AT, moist mucus membranes. Trachea midline, no masses. Cardiovascular: No clubbing, cyanosis, or edema. Respiratory: Normal respiratory effort, no increased work of breathing. Skin: No rashes, bruises or suspicious lesions. Lymph: No cervical or inguinal adenopathy. Neurologic: Grossly intact, no focal deficits, moving all 4 extremities. Psychiatric: Normal mood and affect.    Laboratory Data: Lab Results  Component Value Date   WBC 78.7 (HH) 12/28/2017   HGB 13.9 12/28/2017   HCT 43.1 12/28/2017   MCV 99.0 12/28/2017   PLT 149 (L) 12/28/2017    I have reviewed the labs  Assessment & Plan:    1. History of hematuria Completed hematuria work up in 2017 No reports of gross hematuria   2. Urge incontinence Failed Myrbetriq and Toviaz Will schedule cystoscopy to evaluate for CIS or other anatomical conditions that may be contributing to his urge incontinence I have explained to the patient that they will  be scheduled for a cystoscopy in our office to evaluate their bladder.  The cystoscopy consists of passing a tube with a lens up through their urethra and into their urinary bladder.   We will inject the urethra with a lidocaine gel prior to introducing the cystoscope to help with any discomfort during the procedure.   After the procedure, they might experience blood in the urine and discomfort with urination.  This will abate after the first few voids.  I have  encouraged the patient to increase water intake  during this time.  Patient denies any allergies to lidocaine.     Return for cystoscopy .  These  notes generated with voice recognition software. I apologize for typographical errors.  Zara Council,  PA-C  [facility] [address] [place], [place] [address] [phone]

## 2018-03-13 ENCOUNTER — Encounter: Payer: Self-pay | Admitting: Urology

## 2018-03-13 ENCOUNTER — Ambulatory Visit: Payer: Medicare Other | Admitting: Urology

## 2018-03-13 VITALS — BP 108/65 | HR 55 | Ht 72.0 in | Wt 158.0 lb

## 2018-03-13 DIAGNOSIS — Z87448 Personal history of other diseases of urinary system: Secondary | ICD-10-CM | POA: Diagnosis not present

## 2018-03-13 DIAGNOSIS — N3941 Urge incontinence: Secondary | ICD-10-CM

## 2018-03-13 LAB — BLADDER SCAN AMB NON-IMAGING

## 2018-03-26 NOTE — Progress Notes (Deleted)
[facility]  [phone]) [phone] Fax:(336) [phone]  ID: Nicholas Elliott OB: 10/24/1921  MR#: [id number]  AOZ#:[id number]  Patient Care Team: Tracie Harrier, MD as PCP - General (Internal Medicine)  CHIEF COMPLAINT: CLL  INTERVAL HISTORY: Patient returns to clinic today for laboratory work and routine evaluation.  He continues to feel well and remains asymptomatic. He denies any fevers, chills, or night sweats. He denies any weight loss. He denies any chest pain or shortness of breath.  He has a good appetite and denies any nausea, vomiting, constipation, or diarrhea.  He has no melena or hematochezia.  He has no urinary complaints.  Patient offers no specific complaints today.  REVIEW OF SYSTEMS:   Review of Systems  Constitutional: Negative for chills, fever, malaise/fatigue and weight loss.  Respiratory: Negative.  Negative for cough and shortness of breath.   Cardiovascular: Negative.  Negative for chest pain and leg swelling.  Gastrointestinal: Negative.  Negative for abdominal pain.  Musculoskeletal: Negative.   Neurological: Positive for speech change. Negative for sensory change and weakness.  Endo/Heme/Allergies: Does not bruise/bleed easily.  Psychiatric/Behavioral: Negative.  The patient is not nervous/anxious.     As per HPI. Otherwise, a complete review of systems is negative.  PAST MEDICAL HISTORY: Past Medical History:  Diagnosis Date  . A-fib ([place]) 04/08/2014  . AF (paroxysmal atrial fibrillation) ([place]) 04/11/2015  . Arthritis   . Arthritis   . Carpal tunnel syndrome 01/23/2014  . Cerebral vascular accident ([place]) 04/11/2015  . Cervical spinal stenosis 01/23/2014  . CLL (chronic lymphocytic leukemia) ([place])   . CLL (chronic lymphocytic leukemia) ([place])   . DDD (degenerative disc disease), cervical 01/23/2014  . DDD (degenerative disc disease), lumbar 09/11/2014  . Degenerative arthritis of lumbar spine 01/23/2014  . Episode of syncope 04/11/2015    . Facial droop 04/02/2015  . Forehead contusion 04/03/2015  . Glaucoma   . Heart valve disease 04/08/2014  . High cholesterol   . High cholesterol   . HLD (hyperlipidemia) 04/08/2014  . Hyperglycemia 04/03/2015  . Left elbow contusion 04/03/2015  . Left hip pain 04/03/2015  . Migraine   . Migraine   . Nerve root pain 01/23/2014  . Neuritis or radiculitis due to rupture of lumbar intervertebral disc 09/11/2014  . Stroke ([place]) 04/02/2015  . TIA (transient ischemic attack)   . TIA (transient ischemic attack)     PAST SURGICAL HISTORY: Past Surgical History:  Procedure Laterality Date  . HERNIA REPAIR    . SPINE SURGERY    . upper palate reparin      FAMILY HISTORY Family History  Problem Relation Age of Onset  . Stroke Father   . Heart attack Brother   . Osteoarthritis Mother   . Cancer Sister   . Prostate cancer Neg Hx   . Kidney cancer Neg Hx   . Bladder Cancer Neg Hx        ADVANCED DIRECTIVES:    HEALTH MAINTENANCE: Social History   Tobacco Use  . Smoking status: Never Smoker  . Smokeless tobacco: Never Used  Substance Use Topics  . Alcohol use: No  . Drug use: No     Colonoscopy:  PAP:  Bone density:  Lipid panel:  Allergies  Allergen Reactions  . Moxifloxacin Other (See Comments)    Patient's dementia symptoms increased.    Current Outpatient Medications  Medication Sig Dispense Refill  . albuterol (PROAIR HFA) 108 (90 BASE) MCG/ACT inhaler Inhale into the lungs every 6 (six) hours  as needed for wheezing or shortness of breath. Reported on 03/03/2016    . aspirin EC 81 MG tablet Take 81 mg by mouth daily. Reported on 01/07/2016    . Calcium Carbonate-Vitamin D (CALCIUM HIGH POTENCY/VITAMIN D) 600-200 MG-UNIT TABS Take by mouth.    . chlorpheniramine (CHLOR-TRIMETON) 4 MG tablet Take 1 tablet (4 mg total) by mouth 2 (two) times daily as needed for allergies or rhinitis. 30 tablet 0  . clobetasol (TEMOVATE) 0.05 % external solution Reported on 01/07/2016     . clopidogrel (PLAVIX) 75 MG tablet Take 75 mg by mouth at bedtime.     . diclofenac sodium (VOLTAREN) 1 % GEL Apply 2 g topically 4 (four) times daily. 100 g 0  . digoxin (LANOXIN) 0.125 MG tablet Take 0.125 mg by mouth daily.     . DULoxetine (CYMBALTA) 30 MG capsule Take 30 mg by mouth.    . fesoterodine (TOVIAZ) 4 MG TB24 tablet Take 1 tablet (4 mg total) by mouth daily. 30 tablet 3  . fluticasone (FLONASE) 50 MCG/ACT nasal spray Place into the nose.    . furosemide (LASIX) 20 MG tablet Reported on 01/07/2016    . gabapentin (NEURONTIN) 400 MG capsule 600 mg. Reported on 01/07/2016    . gentamicin ointment (GARAMYCIN) 0.1 %     . Glucosamine-Chondroitin 250-200 MG TABS Take by mouth.    . hydrocortisone 2.5 % ointment Apply topically.    Marland Kitchen ketoconazole (NIZORAL) 2 % shampoo Use 2-3x weekly on face and scalp. Let sit for 2-3 min before rinsing.    . metroNIDAZOLE (METROGEL) 0.75 % gel     . mirabegron ER (MYRBETRIQ) 25 MG TB24 tablet Take 1 tablet (25 mg total) by mouth daily. 30 tablet 11  . Misc Natural Products (OSTEO BI-FLEX JOINT SHIELD PO) Take by mouth.    . moxifloxacin (AVELOX) 400 MG tablet     . Multiple Vitamin (MULTI-VITAMINS) TABS Take by mouth.    . Multiple Vitamins-Minerals (CENTRUM SILVER ADULT 50+ PO) Take by mouth daily.    . rosuvastatin (CRESTOR) 20 MG tablet Take 20 mg by mouth at bedtime.    . simethicone (PHAZYME) 125 MG chewable tablet Chew 125 mg by mouth 2 (two) times daily. Reported on 01/07/2016    . solifenacin (VESICARE) 10 MG tablet Take by mouth.    . traMADol (ULTRAM) 50 MG tablet     . vitamin B-12 100 MCG tablet Take 1 tablet (100 mcg total) by mouth daily.     No current facility-administered medications for this visit.     OBJECTIVE: There were no vitals filed for this visit.   There is no height or weight on file to calculate BMI.    ECOG FS:0 - Asymptomatic  General: Well-developed, well-nourished, no acute distress. Eyes: anicteric  sclera. Lungs: Clear to auscultation bilaterally. Heart: Regular rate and rhythm. No rubs, murmurs, or gallops. Abdomen: Soft, nontender, nondistended. No organomegaly noted, normoactive bowel sounds. Musculoskeletal: No edema, cyanosis, or clubbing. Neuro: Alert, answering all questions appropriately. Cranial nerves grossly intact. Skin: No rashes or petechiae noted. Psych: Normal affect.   LAB RESULTS:  Lab Results  Component Value Date   NA 143 03/17/2017   K 4.3 03/17/2017   CL 108 03/17/2017   CO2 28 03/17/2017   GLUCOSE 96 03/17/2017   BUN 23 06/30/2017   CREATININE 0.80 06/30/2017   CALCIUM 8.6 (L) 03/17/2017   PROT 6.2 (L) 04/02/2015   ALBUMIN 3.9 04/02/2015   AST 44 (  H) 04/02/2015   ALT 22 04/02/2015   ALKPHOS 48 04/02/2015   BILITOT 0.6 04/02/2015   GFRNONAA 78 06/30/2017   GFRAA 90 06/30/2017    Lab Results  Component Value Date   WBC 78.7 (HH) 12/28/2017   NEUTROABS 5.1 12/28/2017   HGB 13.9 12/28/2017   HCT 43.1 12/28/2017   MCV 99.0 12/28/2017   PLT 149 (L) 12/28/2017     STUDIES: Ct Head Wo Contrast  Result Date: 03/08/2018 CLINICAL DATA:  Status post fall in the past week with a blow to the head. Memory loss. EXAM: CT HEAD WITHOUT CONTRAST TECHNIQUE: Contiguous axial images were obtained from the base of the skull through the vertex without intravenous contrast. COMPARISON:  Head CT scan 03/17/2017.  Brain MRI 04/02/2015. FINDINGS: Brain: No evidence of acute infarction, hemorrhage, hydrocephalus, extra-axial collection or mass lesion/mass effect. Atrophy, chronic microvascular ischemic change and remote right frontal infarct are seen. The patient has a right PCA territory infarct which is new since the prior CT scan but appears remote. Vascular: No hyperdense vessel or unexpected calcification. Skull: No fracture or focal lesion. Sinuses/Orbits: Right greater than left mastoid effusions are unchanged. Other: None. IMPRESSION: No acute abnormality.  Atrophy, chronic microvascular ischemic change and remote right frontal infarct. Remote right PCA territory infarct is new since the prior CT scan. Chronic mastoid effusions, right greater than left. Electronically Signed   By: Inge Rise M.D.   On: 03/08/2018 15:50    ASSESSMENT: Rai stage 0 CLL, Zap 70 negative.  PLAN:    1.  CLL: Confirmed by flow cytometry.  Patient's white blood cell count is slowly trending up.  His count was 54 in September 2017 and now has increased to 85.2.  He has no evidence of endorgan damage.  His doubling time is not less than than 6 months.  No intervention is required.  Patient does not require treatment at this time.  CT scan of the abdomen and pelvis on January 02, 2016 only revealed mild splenomegaly. Previously chest CT was negative as well. Imaging does not need to be repeated unless there is suspicion of progression. Return to clinic in 3 months for repeat laboratory work and then in 6 months for laboratory work and further evaluation.  2. Thrombocytopenia: Patient's platelet count is within normal limits today.  Approximately 20 minutes was spent in discussion of which greater than 50% was consultation.  Patient expressed understanding and was in agreement with this plan. He also understands that He can call clinic at any time with any questions, concerns, or complaints.     Nicholas Huger, MD 03/26/18 8:23 AM

## 2018-03-27 ENCOUNTER — Inpatient Hospital Stay
Admission: EM | Admit: 2018-03-27 | Discharge: 2018-03-29 | DRG: 065 | Disposition: A | Discharge status: Skilled Nursing Facility | Payer: Medicare Other | Attending: Internal Medicine | Admitting: Internal Medicine

## 2018-03-27 ENCOUNTER — Encounter: Payer: Self-pay | Admitting: Emergency Medicine

## 2018-03-27 ENCOUNTER — Observation Stay: Payer: Medicare Other

## 2018-03-27 ENCOUNTER — Other Ambulatory Visit: Payer: Self-pay

## 2018-03-27 ENCOUNTER — Emergency Department: Payer: Medicare Other

## 2018-03-27 DIAGNOSIS — E785 Hyperlipidemia, unspecified: Secondary | ICD-10-CM | POA: Diagnosis present

## 2018-03-27 DIAGNOSIS — Z823 Family history of stroke: Secondary | ICD-10-CM

## 2018-03-27 DIAGNOSIS — Y92009 Unspecified place in unspecified non-institutional (private) residence as the place of occurrence of the external cause: Secondary | ICD-10-CM

## 2018-03-27 DIAGNOSIS — M1990 Unspecified osteoarthritis, unspecified site: Secondary | ICD-10-CM | POA: Diagnosis present

## 2018-03-27 DIAGNOSIS — R29705 NIHSS score 5: Secondary | ICD-10-CM | POA: Diagnosis not present

## 2018-03-27 DIAGNOSIS — Z66 Do not resuscitate: Secondary | ICD-10-CM | POA: Diagnosis present

## 2018-03-27 DIAGNOSIS — E875 Hyperkalemia: Secondary | ICD-10-CM | POA: Diagnosis present

## 2018-03-27 DIAGNOSIS — Y92002 Bathroom of unspecified non-institutional (private) residence single-family (private) house as the place of occurrence of the external cause: Secondary | ICD-10-CM

## 2018-03-27 DIAGNOSIS — I480 Paroxysmal atrial fibrillation: Secondary | ICD-10-CM | POA: Diagnosis present

## 2018-03-27 DIAGNOSIS — I639 Cerebral infarction, unspecified: Secondary | ICD-10-CM

## 2018-03-27 DIAGNOSIS — Z7982 Long term (current) use of aspirin: Secondary | ICD-10-CM

## 2018-03-27 DIAGNOSIS — F0390 Unspecified dementia without behavioral disturbance: Secondary | ICD-10-CM | POA: Diagnosis present

## 2018-03-27 DIAGNOSIS — Z8249 Family history of ischemic heart disease and other diseases of the circulatory system: Secondary | ICD-10-CM

## 2018-03-27 DIAGNOSIS — W1811XA Fall from or off toilet without subsequent striking against object, initial encounter: Secondary | ICD-10-CM | POA: Diagnosis present

## 2018-03-27 DIAGNOSIS — I6381 Other cerebral infarction due to occlusion or stenosis of small artery: Secondary | ICD-10-CM | POA: Diagnosis not present

## 2018-03-27 DIAGNOSIS — R29703 NIHSS score 3: Secondary | ICD-10-CM | POA: Diagnosis not present

## 2018-03-27 DIAGNOSIS — R531 Weakness: Secondary | ICD-10-CM | POA: Diagnosis not present

## 2018-03-27 DIAGNOSIS — Z79899 Other long term (current) drug therapy: Secondary | ICD-10-CM

## 2018-03-27 DIAGNOSIS — H409 Unspecified glaucoma: Secondary | ICD-10-CM | POA: Diagnosis present

## 2018-03-27 DIAGNOSIS — C9110 Chronic lymphocytic leukemia of B-cell type not having achieved remission: Secondary | ICD-10-CM

## 2018-03-27 DIAGNOSIS — W19XXXA Unspecified fall, initial encounter: Secondary | ICD-10-CM

## 2018-03-27 DIAGNOSIS — R29704 NIHSS score 4: Secondary | ICD-10-CM | POA: Diagnosis present

## 2018-03-27 DIAGNOSIS — Z809 Family history of malignant neoplasm, unspecified: Secondary | ICD-10-CM

## 2018-03-27 DIAGNOSIS — Z8673 Personal history of transient ischemic attack (TIA), and cerebral infarction without residual deficits: Secondary | ICD-10-CM

## 2018-03-27 DIAGNOSIS — Z881 Allergy status to other antibiotic agents status: Secondary | ICD-10-CM

## 2018-03-27 LAB — URINALYSIS, COMPLETE (UACMP) WITH MICROSCOPIC
BACTERIA UA: NONE SEEN
Bilirubin Urine: NEGATIVE
Glucose, UA: NEGATIVE mg/dL
Ketones, ur: NEGATIVE mg/dL
Leukocytes, UA: NEGATIVE
NITRITE: NEGATIVE
Protein, ur: NEGATIVE mg/dL
SPECIFIC GRAVITY, URINE: 1.011 (ref 1.005–1.030)
SQUAMOUS EPITHELIAL / LPF: NONE SEEN (ref 0–5)
pH: 7 (ref 5.0–8.0)

## 2018-03-27 LAB — CBC
HEMATOCRIT: 42.6 % (ref 40.0–52.0)
HEMOGLOBIN: 13.7 g/dL (ref 13.0–18.0)
MCH: 32.1 pg (ref 26.0–34.0)
MCHC: 32.3 g/dL (ref 32.0–36.0)
MCV: 99.4 fL (ref 80.0–100.0)
Platelets: 163 10*3/uL (ref 150–440)
RBC: 4.28 MIL/uL — ABNORMAL LOW (ref 4.40–5.90)
RDW: 15 % — AB (ref 11.5–14.5)
WBC: 84.6 10*3/uL (ref 3.8–10.6)

## 2018-03-27 LAB — COMPREHENSIVE METABOLIC PANEL
ALBUMIN: 3.8 g/dL (ref 3.5–5.0)
ALK PHOS: 49 U/L (ref 38–126)
ALT: 10 U/L (ref 0–44)
AST: 26 U/L (ref 15–41)
Anion gap: 6 (ref 5–15)
BILIRUBIN TOTAL: 0.9 mg/dL (ref 0.3–1.2)
BUN: 20 mg/dL (ref 8–23)
CALCIUM: 8.6 mg/dL — AB (ref 8.9–10.3)
CO2: 27 mmol/L (ref 22–32)
Chloride: 109 mmol/L (ref 98–111)
Creatinine, Ser: 0.78 mg/dL (ref 0.61–1.24)
GFR calc Af Amer: >60 mL/min (ref >60)
GLUCOSE: 91 mg/dL (ref 70–99)
POTASSIUM: 4.5 mmol/L (ref 3.5–5.1)
Sodium: 142 mmol/L (ref 135–145)
TOTAL PROTEIN: 6.2 g/dL — AB (ref 6.5–8.1)

## 2018-03-27 LAB — TROPONIN I

## 2018-03-27 LAB — LIPASE, BLOOD: Lipase: 37 U/L (ref 11–51)

## 2018-03-27 MED ORDER — DOCUSATE SODIUM 100 MG PO CAPS
100.0000 mg | ORAL_CAPSULE | Freq: Two times a day (BID) | ORAL | Status: DC | PRN
  Administered 2018-03-27: 17:00:00 100 mg via ORAL
  Filled 2018-03-27: qty 1

## 2018-03-27 MED ORDER — ASPIRIN EC 81 MG PO TBEC
81.0000 mg | DELAYED_RELEASE_TABLET | Freq: Every day | ORAL | Status: DC
Start: 2018-03-27 — End: 2018-03-29
  Administered 2018-03-27 – 2018-03-28 (×2): 81 mg via ORAL
  Filled 2018-03-27 (×2): qty 1

## 2018-03-27 MED ORDER — VITAMIN B-12 1000 MCG PO TABS
1000.0000 ug | ORAL_TABLET | Freq: Every day | ORAL | Status: DC
  Administered 2018-03-27 – 2018-03-29 (×3): 1000 ug via ORAL
  Filled 2018-03-27 (×3): qty 1

## 2018-03-27 MED ORDER — SODIUM CHLORIDE 0.9 % IV SOLN
1000.0000 mL | Freq: Once | INTRAVENOUS | Status: AC
  Administered 2018-03-27: 1000 mL via INTRAVENOUS

## 2018-03-27 MED ORDER — HEPARIN SODIUM (PORCINE) 5000 UNIT/ML IJ SOLN
5000.0000 [IU] | Freq: Three times a day (TID) | INTRAMUSCULAR | Status: DC
  Administered 2018-03-27 – 2018-03-29 (×6): 5000 [IU] via SUBCUTANEOUS
  Filled 2018-03-27 (×6): qty 1

## 2018-03-27 NOTE — H&P (Signed)
[facility] NAME: Nicholas Elliott    MR#:  [id number]  DATE OF BIRTH:  [birth year]  DATE OF ADMISSION:  03/27/2018  PRIMARY CARE PHYSICIAN: Tracie Harrier, MD   REQUESTING/REFERRING PHYSICIAN: [email or web address]  CHIEF COMPLAINT:   Chief Complaint  Patient presents with  . Fall    HISTORY OF PRESENT ILLNESS: Nicholas Elliott  is a 84 y.o. male with a known history of A fib, Arthritis, CLL< Glaucoma, dementia. CVA, HLD- Lives with wife,a ble to walk with a cane. Last night could not get up in bathroom and fell from commode. Wife Called EMS, they helped to get back to bed, His vitals were fine. Again in morning- was very weak and could not get out of bed- so brought to ER> Infection work up and initial CT head is negative. No focal weakness. No new confusion or mental status change. Given to admit, as wife can not take care of him. He can not walk.  PAST MEDICAL HISTORY:   Past Medical History:  Diagnosis Date  . A-fib ([place]) 04/08/2014  . AF (paroxysmal atrial fibrillation) ([place]) 04/11/2015  . Arthritis   . Arthritis   . Carpal tunnel syndrome 01/23/2014  . Cerebral vascular accident ([place]) 04/11/2015  . Cervical spinal stenosis 01/23/2014  . CLL (chronic lymphocytic leukemia) ([place])   . CLL (chronic lymphocytic leukemia) ([place])   . DDD (degenerative disc disease), cervical 01/23/2014  . DDD (degenerative disc disease), lumbar 09/11/2014  . Degenerative arthritis of lumbar spine 01/23/2014  . Episode of syncope 04/11/2015  . Facial droop 04/02/2015  . Forehead contusion 04/03/2015  . Glaucoma   . Heart valve disease 04/08/2014  . High cholesterol   . High cholesterol   . HLD (hyperlipidemia) 04/08/2014  . Hyperglycemia 04/03/2015  . Left elbow contusion 04/03/2015  . Left hip pain 04/03/2015  . Migraine   . Migraine   . Nerve root pain 01/23/2014  . Neuritis or radiculitis due to rupture of lumbar intervertebral disc 09/11/2014  . Stroke ([place]) 04/02/2015  .  TIA (transient ischemic attack)   . TIA (transient ischemic attack)     PAST SURGICAL HISTORY:  Past Surgical History:  Procedure Laterality Date  . HERNIA REPAIR    . SPINE SURGERY    . upper palate reparin      SOCIAL HISTORY:  Social History   Tobacco Use  . Smoking status: Never Smoker  . Smokeless tobacco: Never Used  Substance Use Topics  . Alcohol use: No    FAMILY HISTORY:  Family History  Problem Relation Age of Onset  . Stroke Father   . Heart attack Brother   . Osteoarthritis Mother   . Cancer Sister   . Prostate cancer Neg Hx   . Kidney cancer Neg Hx   . Bladder Cancer Neg Hx     DRUG ALLERGIES:  Allergies  Allergen Reactions  . Moxifloxacin Other (See Comments)    Patient's dementia symptoms increased.    REVIEW OF SYSTEMS:   CONSTITUTIONAL: No fever,have fatigue or weakness.  EYES: No blurred or double vision.  EARS, NOSE, AND THROAT: No tinnitus or ear pain.  RESPIRATORY: No cough, shortness of breath, wheezing or hemoptysis.  CARDIOVASCULAR: No chest pain, orthopnea, edema.  GASTROINTESTINAL: No nausea, vomiting, diarrhea or abdominal pain.  GENITOURINARY: No dysuria, hematuria.  ENDOCRINE: No polyuria, nocturia,  HEMATOLOGY: No anemia, easy bruising or bleeding SKIN: No rash or lesion. MUSCULOSKELETAL: No joint pain or arthritis.  NEUROLOGIC: No tingling, numbness, weakness.  PSYCHIATRY: No anxiety or depression.   MEDICATIONS AT HOME:  Prior to Admission medications   Medication Sig Start Date End Date Taking? Authorizing Provider  aspirin EC 81 MG tablet Take 81 mg by mouth daily.    Yes [provider]  vitamin B-12 (CYANOCOBALAMIN) 1000 MCG tablet Take 1,000 mcg by mouth daily.   Yes [provider]  fesoterodine (TOVIAZ) 4 MG TB24 tablet Take 1 tablet (4 mg total) by mouth daily. Patient not taking: Reported on 03/27/2018 02/01/18   Zara Council A, PA-C      PHYSICAL EXAMINATION:   VITAL SIGNS: Blood  pressure (!) 145/67, pulse (!) 57, temperature 97.6 F (36.4 C), temperature source Oral, resp. rate 20, height 6' (1.829 m), weight 83 kg (183 lb), SpO2 98 %.  GENERAL:  84 y.o.-year-old patient lying in the bed with no acute distress.  EYES: Pupils equal, round, reactive to light and accommodation. No scleral icterus. Extraocular muscles intact.  HEENT: Head atraumatic, normocephalic. Oropharynx and nasopharynx clear.  NECK:  Supple, no jugular venous distention. No thyroid enlargement, no tenderness.  LUNGS: Normal breath sounds bilaterally, no wheezing, rales,rhonchi or crepitation. No use of accessory muscles of respiration.  CARDIOVASCULAR: S1, S2 normal. No murmurs, rubs, or gallops.  ABDOMEN: Soft, nontender, nondistended. Bowel sounds present. No organomegaly or mass.  EXTREMITIES: No pedal edema, cyanosis, or clubbing.  NEUROLOGIC: Cranial nerves II through XII are intact. Muscle strength 4/5 in all extremities. Sensation intact. Gait not checked.  PSYCHIATRIC: The patient is alert and oriented x 2.  SKIN: No obvious rash, lesion, or ulcer.   LABORATORY PANEL:   CBC Recent Labs  Lab 03/27/18 0846  WBC 84.6*  HGB 13.7  HCT 42.6  PLT 163  MCV 99.4  MCH 32.1  MCHC 32.3  RDW 15.0*   ------------------------------------------------------------------------------------------------------------------  Chemistries  Recent Labs  Lab 03/27/18 0846  NA 142  K 4.5  CL 109  CO2 27  GLUCOSE 91  BUN 20  CREATININE 0.78  CALCIUM 8.6*  AST 26  ALT 10  ALKPHOS 49  BILITOT 0.9   ------------------------------------------------------------------------------------------------------------------ estimated creatinine clearance is 63.3 mL/min (by C-G formula based on SCr of 0.78 mg/dL). ------------------------------------------------------------------------------------------------------------------ No results for input(s): TSH, T4TOTAL, T3FREE, THYROIDAB in the last 72  hours.  Invalid input(s): FREET3   Coagulation profile No results for input(s): INR, PROTIME in the last 168 hours. ------------------------------------------------------------------------------------------------------------------- No results for input(s): DDIMER in the last 72 hours. -------------------------------------------------------------------------------------------------------------------  Cardiac Enzymes Recent Labs  Lab 03/27/18 0846  TROPONINI <0.03   ------------------------------------------------------------------------------------------------------------------ Invalid input(s): POCBNP  ---------------------------------------------------------------------------------------------------------------  Urinalysis    Component Value Date/Time   COLORURINE YELLOW (A) 03/27/2018 0909   APPEARANCEUR CLEAR (A) 03/27/2018 0909   APPEARANCEUR Clear 01/06/2017 0959   LABSPEC 1.011 03/27/2018 0909   LABSPEC 1.021 11/22/2013 1941   PHURINE 7.0 03/27/2018 0909   GLUCOSEU NEGATIVE 03/27/2018 0909   GLUCOSEU Negative 11/22/2013 1941   HGBUR SMALL (A) 03/27/2018 0909   BILIRUBINUR NEGATIVE 03/27/2018 0909   BILIRUBINUR Negative 01/06/2017 0959   BILIRUBINUR Negative 11/22/2013 1941   KETONESUR NEGATIVE 03/27/2018 0909   PROTEINUR NEGATIVE 03/27/2018 0909   UROBILINOGEN 0.2 04/02/2015 2020   NITRITE NEGATIVE 03/27/2018 0909   LEUKOCYTESUR NEGATIVE 03/27/2018 0909   LEUKOCYTESUR Negative 01/06/2017 0959   LEUKOCYTESUR Negative 11/22/2013 1941     RADIOLOGY: Dg Chest Port 1 View  Result Date: 03/27/2018 CLINICAL DATA:  Fall EXAM: PORTABLE CHEST 1 VIEW COMPARISON:  11/24/2013 FINDINGS:  Mild right basilar opacity, likely atelectasis. No focal consolidation. No pleural effusion or pneumothorax. Heart is normal in size. Visualized osseous structures are within normal limits. IMPRESSION: No evidence of acute cardiopulmonary disease. Electronically Signed   By: Julian Hy  M.D.   On: 03/27/2018 09:05    EKG: Orders placed or performed during the hospital encounter of 03/27/18  . ED EKG  . ED EKG  . EKG 12-Lead  . EKG 12-Lead    IMPRESSION AND PLAN:  * Generalized weakness   Will do MRI brain to r/o new stroke as have history.   Get PT eval, SW eval to help arrangements.   No source of infection found.  * CLL   Follows with Cancer center.  * A fib   IN NSR now, on ASA, cont    All the records are reviewed and case discussed with ED provider. Management plans discussed with the patient, family and they are in agreement.  CODE STATUS: DNR    Code Status Orders  (From admission, onward)        Start     Ordered   03/27/18 1307  Full code  Continuous     03/27/18 1306    Code Status History    Date Active Date Inactive Code Status Order ID Comments User Context   04/02/2015 2331 04/08/2015 2022 DNR [id number]  Ivor Costa, MD Inpatient     Wife and a good friend in room.  TOTAL TIME TAKING CARE OF THIS PATIENT: 50 minutes.    Vaughan Basta M.D on 03/27/2018   Between 7am to 6pm - Pager - [phone]  After 6pm go to [email or web address] - password [email or web address] [facility]  Office  [phone]  CC: Primary care physician; Tracie Harrier, MD   Note: This dictation was prepared with Dragon dictation along with smaller phrase technology. Any transcriptional errors that result from this process are unintentional.

## 2018-03-27 NOTE — Progress Notes (Signed)
PT Cancellation Note  Patient Details Name: SAUNDERS ARLINGTON MRN: [id number] DOB: 30-May-1922   Cancelled Treatment:    Reason Eval/Treat Not Completed: (Consult received and chart reviewed.  Patient currently eating lunch; will re-attempt at later time/date as medically appropriate and available.)   Lorretta Kerce H. Owens Shark, PT, DPT, NCS 03/27/18, 1:53 PM [phone]

## 2018-03-27 NOTE — ED Notes (Signed)
MRI has done phone screening with wife.

## 2018-03-27 NOTE — Care Management Obs Status (Signed)
[facility] NOTIFICATION   Patient Details  Name: Nicholas Elliott MRN: [id number] Date of Birth: 04/23/1922   Medicare Observation Status Notification Given:  Yes    Imanuel Pruiett A Emogene Muratalla, RN 03/27/2018, 2:43 PM

## 2018-03-27 NOTE — ED Notes (Signed)
Report to Juanette.

## 2018-03-27 NOTE — ED Notes (Signed)
Assist patient to stand at bedside to use urinal. Patient weak and unsteady. 2 nurse assist required to stand to urinate. Denies pain.

## 2018-03-27 NOTE — Plan of Care (Signed)
  Problem: Health Behavior/Discharge Planning: Goal: Ability to manage health-related needs will improve Outcome: Progressing   Problem: Clinical Measurements: Goal: Will remain free from infection Outcome: Progressing Goal: Diagnostic test results will improve Outcome: Progressing   Problem: Nutrition: Goal: Adequate nutrition will be maintained Outcome: Progressing   Problem: Elimination: Goal: Will not experience complications related to urinary retention Outcome: Progressing   Problem: Pain Managment: Goal: General experience of comfort will improve Outcome: Progressing   Problem: Safety: Goal: Ability to remain free from injury will improve Outcome: Progressing   Problem: Skin Integrity: Goal: Risk for impaired skin integrity will decrease Outcome: Progressing

## 2018-03-27 NOTE — ED Provider Notes (Signed)
[facility] Emergency Department Provider Note   ____________________________________________    I have reviewed the triage vital signs and the nursing notes.   HISTORY  Chief Complaint Fall   Limited history available, patient is a poor historian  HPI Nicholas Elliott is a 84 y.o. male with history as detailed below presents after a fall from home.  Patient states he got this morning to go to the bathroom and reports that he fell.  He denies injury.  He is not sure why he fell.  He denies chest pain.  He reports some mild epigastric discomfort.  No nausea or vomiting.  No back pain or extremity pain.  He did not hit his head he does report feeling somewhat weak all over Past Medical History:  Diagnosis Date  . A-fib ([place]) 04/08/2014  . AF (paroxysmal atrial fibrillation) ([place]) 04/11/2015  . Arthritis   . Arthritis   . Carpal tunnel syndrome 01/23/2014  . Cerebral vascular accident ([place]) 04/11/2015  . Cervical spinal stenosis 01/23/2014  . CLL (chronic lymphocytic leukemia) ([place])   . CLL (chronic lymphocytic leukemia) ([place])   . DDD (degenerative disc disease), cervical 01/23/2014  . DDD (degenerative disc disease), lumbar 09/11/2014  . Degenerative arthritis of lumbar spine 01/23/2014  . Episode of syncope 04/11/2015  . Facial droop 04/02/2015  . Forehead contusion 04/03/2015  . Glaucoma   . Heart valve disease 04/08/2014  . High cholesterol   . High cholesterol   . HLD (hyperlipidemia) 04/08/2014  . Hyperglycemia 04/03/2015  . Left elbow contusion 04/03/2015  . Left hip pain 04/03/2015  . Migraine   . Migraine   . Nerve root pain 01/23/2014  . Neuritis or radiculitis due to rupture of lumbar intervertebral disc 09/11/2014  . Stroke ([place]) 04/02/2015  . TIA (transient ischemic attack)   . TIA (transient ischemic attack)     Patient Active Problem List   Diagnosis Date Noted  . Generalized weakness 03/27/2018  . Moderate aortic valve stenosis 04/12/2017  .  Atherosclerotic peripheral vascular disease with intermittent claudication ([place]) 12/29/2015  . BPH with obstruction/lower urinary tract symptoms 12/21/2015  . Microscopic hematuria 12/21/2015  . Urge incontinence 12/21/2015  . Benign essential HTN 06/30/2015  . Cerebral vascular accident ([place]) 04/11/2015  . AF (paroxysmal atrial fibrillation) ([place]) 04/11/2015  . Episode of syncope 04/11/2015  . Cerebrovascular accident (CVA) ([place]) 04/11/2015  . Paroxysmal atrial fibrillation ([place]) 04/11/2015  . Left hip pain 04/03/2015  . Forehead contusion 04/03/2015  . Left elbow contusion 04/03/2015  . Hyperglycemia 04/03/2015  . Facial droop 04/02/2015  . Stroke ([place]) 04/02/2015  . High cholesterol   . TIA (transient ischemic attack)   . Arthritis   . Migraine   . CLL (chronic lymphocytic leukemia) ([place])   . Chronic lymphocytic leukemia ([place]) 09/16/2014  . DDD (degenerative disc disease), lumbar 09/11/2014  . Neuritis or radiculitis due to rupture of lumbar intervertebral disc 09/11/2014  . A-fib ([place]) 04/08/2014  . Heart valve disease 04/08/2014  . HLD (hyperlipidemia) 04/08/2014  . Atrial fibrillation ([place]) 04/08/2014  . Carpal tunnel syndrome 01/23/2014  . DDD (degenerative disc disease), cervical 01/23/2014  . Cervical spinal stenosis 01/23/2014  . Degenerative arthritis of lumbar spine 01/23/2014  . Nerve root pain 01/23/2014  . Degeneration of intervertebral disc of cervical region 01/23/2014    Past Surgical History:  Procedure Laterality Date  . HERNIA REPAIR    . SPINE SURGERY    . upper palate reparin  Prior to Admission medications   Medication Sig Start Date End Date Taking? Authorizing Provider  aspirin EC 81 MG tablet Take 81 mg by mouth daily.    Yes [provider]  vitamin B-12 (CYANOCOBALAMIN) 1000 MCG tablet Take 1,000 mcg by mouth daily.   Yes [provider]  fesoterodine (TOVIAZ) 4 MG TB24 tablet Take 1 tablet (4 mg total) by mouth  daily. Patient not taking: Reported on 03/27/2018 02/01/18   Zara Council A, PA-C     Allergies Moxifloxacin  Family History  Problem Relation Age of Onset  . Stroke Father   . Heart attack Brother   . Osteoarthritis Mother   . Cancer Sister   . Prostate cancer Neg Hx   . Kidney cancer Neg Hx   . Bladder Cancer Neg Hx     Social History Social History   Tobacco Use  . Smoking status: Never Smoker  . Smokeless tobacco: Never Used  Substance Use Topics  . Alcohol use: No  . Drug use: No    Review of Systems  Constitutional: Denies dizziness, states feels weak all over Eyes: No visual changes.  ENT: No sore throat. Cardiovascular: Denies chest pain. Respiratory: Denies shortness of breath. Gastrointestinal: No abdominal pain.  No nausea, no vomiting.   Genitourinary: Negative for dysuria. Musculoskeletal: Negative for back pain. Skin: Negative for rash. Neurological: Negative for headaches or weakness   ____________________________________________   PHYSICAL EXAM:  VITAL SIGNS: ED Triage Vitals  Enc Vitals Group     BP 03/27/18 0834 (!) 162/59     Pulse Rate 03/27/18 0834 84     Resp 03/27/18 0834 18     Temp 03/27/18 0834 98.7 F (37.1 C)     Temp Source 03/27/18 0834 Oral     SpO2 03/27/18 0834 100 %     Weight 03/27/18 0836 83 kg (183 lb)     Height 03/27/18 0836 1.829 m (6')     Head Circumference --      Peak Flow --      Pain Score --      Pain Loc --      Pain Edu? --      Excl. in [place]? --     Constitutional: Alert Eyes: Conjunctivae are normal.  Head: Atraumatic.  No hematoma or laceration Nose: No congestion/rhinnorhea. Mouth/Throat: Mucous membranes are moist.   Neck:  Painless ROM no vertebral tenderness to palpation Cardiovascular: Normal rate, irregularly irregular rhythm. Grossly normal heart sounds.  Good peripheral circulation. Respiratory: Normal respiratory effort.  No retractions. Lungs CTAB. Gastrointestinal: Soft and  nontender. No distention.    Musculoskeletal: No lower extremity tenderness nor edema.  Warm and well perfused, normal strength in all extremity's, no pain with axial load on both hips. Neurologic:  Normal speech and language. No gross focal neurologic deficits are appreciated.  Strength is normal in all extremities Skin:  Skin is warm, dry and intact. No rash noted. Psychiatric: Mood and affect are normal. Speech and behavior are normal.  ____________________________________________   LABS (all labs ordered are listed, but only abnormal results are displayed)  Labs Reviewed  CBC - Abnormal; Notable for the following components:      Result Value   WBC 84.6 (*)    RBC 4.28 (*)    RDW 15.0 (*)    All other components within normal limits  COMPREHENSIVE METABOLIC PANEL - Abnormal; Notable for the following components:   Calcium 8.6 (*)    Total Protein  6.2 (*)    All other components within normal limits  URINALYSIS, COMPLETE (UACMP) WITH MICROSCOPIC - Abnormal; Notable for the following components:   Color, Urine YELLOW (*)    APPearance CLEAR (*)    Hgb urine dipstick SMALL (*)    All other components within normal limits  TROPONIN I  LIPASE, BLOOD   ____________________________________________  EKG  ED ECG REPORT I, Lavonia Drafts, the attending physician, personally viewed and interpreted this ECG.  Date: 03/27/2018  Rhythm: Atrial fibrillation QRS Axis: normal Intervals: normal ST/T Wave abnormalities: normal Narrative Interpretation: no evidence of acute ischemia  ____________________________________________  RADIOLOGY  Chest x-ray negative for pneumonia ____________________________________________   PROCEDURES  Procedure(s) performed: No  Procedures   Critical Care performed: No ____________________________________________   INITIAL IMPRESSION / ASSESSMENT AND PLAN / ED COURSE  Pertinent labs & imaging results that were available during my care  of the patient were reviewed by me and considered in my medical decision making (see chart for details).  Patient presents after fall, unclear if this was mechanical if other etiology and poor history, will obtain labs, chest x-ray, EKG give IV fluids and continue to monitor  Patient with elevated white blood cell count consistent with his diagnosis of CLL, discussed with Dr. Grayland Ormond nothing to be done at this time.  Patient is markedly weak and unable to get up on his own, he lives with his elderly wife at home.  Will require admission for further work-up and evaluation    ____________________________________________   FINAL CLINICAL IMPRESSION(S) / ED DIAGNOSES  Final diagnoses:  Fall in home, initial encounter  CLL (chronic lymphocytic leukemia) ([place])  Generalized weakness        Note:  This document was prepared using Dragon voice recognition software and may include unintentional dictation errors.    Lavonia Drafts, MD 03/27/18 [phone]

## 2018-03-27 NOTE — Progress Notes (Signed)
Family Meeting Note  Advance Directive:yes  Today a meeting took place with the spouse.  The following clinical team members were present during this meeting:MD  The following were discussed:Patient's diagnosis: CLL, Dementia, Patient's progosis: Unable to determine and Goals for treatment: DNR  Additional follow-up to be provided: PMD, cancer center  Time spent during discussion:20 minutes  Vaughan Basta, MD

## 2018-03-27 NOTE — ED Triage Notes (Signed)
Per EMS report patient lives at home with wife and had a fall last night. Patient states abd pain. Denies other pain. MAE well.

## 2018-03-28 ENCOUNTER — Inpatient Hospital Stay: Payer: Medicare Other

## 2018-03-28 ENCOUNTER — Inpatient Hospital Stay
Admit: 2018-03-28 | Discharge: 2018-03-28 | Disposition: A | Discharge status: Home / Self Care | Payer: Medicare Other | Attending: Internal Medicine | Admitting: Internal Medicine

## 2018-03-28 DIAGNOSIS — F0390 Unspecified dementia without behavioral disturbance: Secondary | ICD-10-CM | POA: Diagnosis present

## 2018-03-28 DIAGNOSIS — M1990 Unspecified osteoarthritis, unspecified site: Secondary | ICD-10-CM | POA: Diagnosis present

## 2018-03-28 DIAGNOSIS — R29704 NIHSS score 4: Secondary | ICD-10-CM | POA: Diagnosis present

## 2018-03-28 DIAGNOSIS — Z7982 Long term (current) use of aspirin: Secondary | ICD-10-CM | POA: Diagnosis not present

## 2018-03-28 DIAGNOSIS — Z823 Family history of stroke: Secondary | ICD-10-CM | POA: Diagnosis not present

## 2018-03-28 DIAGNOSIS — H409 Unspecified glaucoma: Secondary | ICD-10-CM | POA: Diagnosis present

## 2018-03-28 DIAGNOSIS — Z809 Family history of malignant neoplasm, unspecified: Secondary | ICD-10-CM | POA: Diagnosis not present

## 2018-03-28 DIAGNOSIS — I639 Cerebral infarction, unspecified: Secondary | ICD-10-CM | POA: Diagnosis not present

## 2018-03-28 DIAGNOSIS — Z8673 Personal history of transient ischemic attack (TIA), and cerebral infarction without residual deficits: Secondary | ICD-10-CM | POA: Diagnosis not present

## 2018-03-28 DIAGNOSIS — R29703 NIHSS score 3: Secondary | ICD-10-CM | POA: Diagnosis not present

## 2018-03-28 DIAGNOSIS — E875 Hyperkalemia: Secondary | ICD-10-CM | POA: Diagnosis present

## 2018-03-28 DIAGNOSIS — Z8249 Family history of ischemic heart disease and other diseases of the circulatory system: Secondary | ICD-10-CM | POA: Diagnosis not present

## 2018-03-28 DIAGNOSIS — E785 Hyperlipidemia, unspecified: Secondary | ICD-10-CM | POA: Diagnosis present

## 2018-03-28 DIAGNOSIS — R531 Weakness: Secondary | ICD-10-CM | POA: Diagnosis present

## 2018-03-28 DIAGNOSIS — I6381 Other cerebral infarction due to occlusion or stenosis of small artery: Secondary | ICD-10-CM | POA: Diagnosis present

## 2018-03-28 DIAGNOSIS — W1811XA Fall from or off toilet without subsequent striking against object, initial encounter: Secondary | ICD-10-CM | POA: Diagnosis present

## 2018-03-28 DIAGNOSIS — Y92002 Bathroom of unspecified non-institutional (private) residence single-family (private) house as the place of occurrence of the external cause: Secondary | ICD-10-CM | POA: Diagnosis not present

## 2018-03-28 DIAGNOSIS — Z881 Allergy status to other antibiotic agents status: Secondary | ICD-10-CM | POA: Diagnosis not present

## 2018-03-28 DIAGNOSIS — Z66 Do not resuscitate: Secondary | ICD-10-CM | POA: Diagnosis present

## 2018-03-28 DIAGNOSIS — C9110 Chronic lymphocytic leukemia of B-cell type not having achieved remission: Secondary | ICD-10-CM | POA: Diagnosis present

## 2018-03-28 DIAGNOSIS — Z79899 Other long term (current) drug therapy: Secondary | ICD-10-CM | POA: Diagnosis not present

## 2018-03-28 DIAGNOSIS — R29705 NIHSS score 5: Secondary | ICD-10-CM | POA: Diagnosis not present

## 2018-03-28 DIAGNOSIS — I480 Paroxysmal atrial fibrillation: Secondary | ICD-10-CM | POA: Diagnosis present

## 2018-03-28 LAB — CBC
HCT: 45.4 % (ref 40.0–52.0)
Hemoglobin: 14.6 g/dL (ref 13.0–18.0)
MCH: 31.7 pg (ref 26.0–34.0)
MCHC: 32.1 g/dL (ref 32.0–36.0)
MCV: 98.7 fL (ref 80.0–100.0)
Platelets: 177 10*3/uL (ref 150–440)
RBC: 4.6 MIL/uL (ref 4.40–5.90)
RDW: 14.9 % — AB (ref 11.5–14.5)
WBC: 98 10*3/uL — AB (ref 3.8–10.6)

## 2018-03-28 LAB — BASIC METABOLIC PANEL
Anion gap: 7 (ref 5–15)
BUN: 13 mg/dL (ref 8–23)
CO2: 29 mmol/L (ref 22–32)
CREATININE: 0.8 mg/dL (ref 0.61–1.24)
Calcium: 8.9 mg/dL (ref 8.9–10.3)
Chloride: 109 mmol/L (ref 98–111)
GFR calc Af Amer: >60 mL/min (ref >60)
Glucose, Bld: 85 mg/dL (ref 70–99)
POTASSIUM: 5.2 mmol/L — AB (ref 3.5–5.1)
SODIUM: 145 mmol/L (ref 135–145)

## 2018-03-28 LAB — ECHOCARDIOGRAM COMPLETE
HEIGHTINCHES: 72 in
Weight: 2928 oz

## 2018-03-28 MED ORDER — DILTIAZEM HCL 30 MG PO TABS
30.0000 mg | ORAL_TABLET | Freq: Four times a day (QID) | ORAL | Status: DC
  Administered 2018-03-28 – 2018-03-29 (×2): 30 mg via ORAL
  Filled 2018-03-28 (×3): qty 1

## 2018-03-28 NOTE — Plan of Care (Signed)
  Problem: Clinical Measurements: Goal: Will remain free from infection Outcome: Progressing Goal: Diagnostic test results will improve Outcome: Progressing   Problem: Activity: Goal: Risk for activity intolerance will decrease Outcome: Progressing   Problem: Nutrition: Goal: Adequate nutrition will be maintained Outcome: Progressing   Problem: Elimination: Goal: Will not experience complications related to urinary retention Outcome: Progressing   Problem: Pain Managment: Goal: General experience of comfort will improve Outcome: Progressing   Problem: Safety: Goal: Ability to remain free from injury will improve Outcome: Progressing   Problem: Skin Integrity: Goal: Risk for impaired skin integrity will decrease Outcome: Progressing   Problem: Education: Goal: Knowledge of secondary prevention will improve Outcome: Progressing   Problem: Ischemic Stroke/TIA Tissue Perfusion: Goal: Complications of ischemic stroke/TIA will be minimized Outcome: Progressing

## 2018-03-28 NOTE — Evaluation (Addendum)
Clinical/Bedside Swallow Evaluation Patient Details  Name: Nicholas Elliott MRN: [id number] Date of Birth: [birth year]  Today's Date: 03/28/2018 Time: SLP Start Time (ACUTE ONLY): 1203 SLP Stop Time (ACUTE ONLY): 1303 SLP Time Calculation (min) (ACUTE ONLY): 60 min  Past Medical History:  Past Medical History:  Diagnosis Date  . A-fib ([place]) 04/08/2014  . AF (paroxysmal atrial fibrillation) ([place]) 04/11/2015  . Arthritis   . Arthritis   . Carpal tunnel syndrome 01/23/2014  . Cerebral vascular accident ([place]) 04/11/2015  . Cervical spinal stenosis 01/23/2014  . CLL (chronic lymphocytic leukemia) ([place])   . CLL (chronic lymphocytic leukemia) ([place])   . DDD (degenerative disc disease), cervical 01/23/2014  . DDD (degenerative disc disease), lumbar 09/11/2014  . Degenerative arthritis of lumbar spine 01/23/2014  . Episode of syncope 04/11/2015  . Facial droop 04/02/2015  . Forehead contusion 04/03/2015  . Glaucoma   . Heart valve disease 04/08/2014  . High cholesterol   . High cholesterol   . HLD (hyperlipidemia) 04/08/2014  . Hyperglycemia 04/03/2015  . Left elbow contusion 04/03/2015  . Left hip pain 04/03/2015  . Migraine   . Migraine   . Nerve root pain 01/23/2014  . Neuritis or radiculitis due to rupture of lumbar intervertebral disc 09/11/2014  . Stroke ([place]) 04/02/2015  . TIA (transient ischemic attack)   . TIA (transient ischemic attack)    Past Surgical History:  Past Surgical History:  Procedure Laterality Date  . HERNIA REPAIR    . SPINE SURGERY    . upper palate reparin     HPI:  Pt is a 84 y.o. male with a known history of R MCA stroke in 03/2015 resulting in dysphagia and recommended to be on a dysphagia diet w/ thickened liquids, A fib, Arthritis, CLL< Glaucoma, Dementia, HLD- Lives with wife, able to walk with a cane. Last night could not get up in bathroom and fell from commode. Wife Called EMS, they helped to get back to bed, His vitals were fine. Again in morning, pt was very weak  and could not get out of bed so brought to ER. Pt is awake, and able to answer very basic questions and follow basic commands. Pt is HOH w/ aids in place. Speech was somewhat muffled/mumbled - Dysarthric(wife stated not like this at baseline). Wife reported pt has had long-standing difficulty eating/drinking w/ coughing noted "often" during meals BEFORE 2016. 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 He wears an upper denture plate w/ intact soft palate noted more posterior. MRI revealed: Right paramedian pontine acute/early subacute infarction.   Assessment / Plan / Recommendation Clinical Impression  Pt appears to present w/ oropharyngeal phase dysphagia w/ increased risk for aspiration d/t the dysphagia and his Cognitive and medical status' decline. Wife stated pt has premorbid dysphagia from palatal deficits "for many years" but alos noted dysphagia documented on ZGYF(7494) post R MCA CVA then. Wife stated he "has to be careful when drinking and eating to not get choked". She felt he had more difficulty when eating solids. Currently, pt's oral phase appeared slower w/ less overall awareness of task during po trials. Increased mastication time w/ bolus residue noted b/t trials in Left buccal area/lingual area. Suspect less oral phase control w/ premature spillage w/ the liquids as audible, multiple swallows were noted w/ trials of liquids followed by overt, immediate coughing w/ Thin liquid tirals. He appeared to have improved pharyngeal phase presentation w/ trials of Nectar consistency liquids. All liquids were  given via Cup w/ monitoring for bolus size as needed. Pt exhibited decreased oral awarness for oral clearing b/t bites of foods - educated Wife that this was an issues that could increase risk for choking/aspiration if not montiored. Due to pt's baseline of dsyphagia as well as in 2016, declined Cognitive status (Dementia), and current, overt s/s of oropharyngeal phase dysphagia, SLP  recommends a Dysphagia level 3(mech soft) w/ NECTAR consistency liquids via Cup w/ strict aspiration precautions. ST will f/u w/ objective swallow assessment tomorrow and toleration of diet, education w/ Wife, pt and family. NSG/MD updated.  SLP Visit Diagnosis: Dysphagia, oropharyngeal phase (R13.12)    Aspiration Risk  Moderate aspiration risk    Diet Recommendation  Dysphagia level 3 (mech soft) w/ NECTAR consistency liquids; aspiration precautions. Tray setup at meals and monitoring at meals for oral clearing.   Medication Administration: Whole meds with puree(for safer swallowing )    Other  Recommendations Recommended Consults: (Dietician f/u) Oral Care Recommendations: Oral care BID;Patient independent with oral care;Staff/trained caregiver to provide oral care Other Recommendations: Order thickener from pharmacy;Prohibited food (jello, ice cream, thin soups);Remove water pitcher;Have oral suction available   Follow up Recommendations (TBD)      Frequency and Duration min 3x week  2 weeks       Prognosis Prognosis for Safe Diet Advancement: Fair Barriers to Reach Goals: Cognitive deficits      Swallow Study   General Date of Onset: 03/27/18 HPI: Pt is a 84 y.o. male with a known history of R MCA stroke in 03/2015 resulting in dysphagia and recommended to be on a dysphagia diet w/ thickened liquids, A fib, Arthritis, CLL< Glaucoma, Dementia, HLD- Lives with wife, able to walk with a cane. Last night could not get up in bathroom and fell from commode. Wife Called EMS, they helped to get back to bed, His vitals were fine. Again in morning, pt was very weak and could not get out of bed so brought to ER. Pt is awake, and able to answer very basic questions and follow basic commands. Pt is HOH w/ aids in place. Speech was somewhat muffled/mumbled - Dysarthric(wife stated not like this at baseline). Wife reported pt has had long-standing difficulty eating/drinking w/ coughing noted  "often" during meals BEFORE 2016. 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 He wears an upper denture plate w/ intact soft palate noted more posterior. Type of Study: Bedside Swallow Evaluation Previous Swallow Assessment: MBSS in 2016 Diet Prior to this Study: Regular;Thin liquids(per report) Temperature Spikes Noted: No(wbc elevated) Respiratory Status: Room air History of Recent Intubation: No Behavior/Cognition: Alert;Cooperative;Pleasant mood;Confused;Distractible;Requires cueing Oral Cavity Assessment: Within Functional Limits Oral Care Completed by SLP: Recent completion by staff Oral Cavity - Dentition: Dentures, top(bottom dentition) Vision: Functional for self-feeding Self-Feeding Abilities: Able to feed self;Needs assist;Needs set up(monitoring for improved awareness of bolus material/clearing) Patient Positioning: Upright in bed(needed positioning) Baseline Vocal Quality: Low vocal intensity(muffled/mumbled) Volitional Cough: Congested(fair) Volitional Swallow: Able to elicit    Oral/Motor/Sensory Function Overall Oral Motor/Sensory Function: Mild impairment(-Moderate impairment) Facial ROM: Reduced left Facial Symmetry: Abnormal symmetry left Facial Strength: Reduced left Facial Sensation: Reduced left(min) Lingual ROM: Reduced left(min) Lingual Symmetry: Within Functional Limits(grossly) Lingual Strength: Reduced(Left) Lingual Sensation: Within Functional Limits Velum: Within Functional Limits Mandible: Within Functional Limits   Ice Chips Ice chips: Within functional limits Presentation: Spoon(fed; 2 trials)   Thin Liquid Thin Liquid: Impaired Presentation: Cup;Self Fed(4-5 trials) Oral Phase Impairments: Reduced labial seal(Left )  Oral Phase Functional Implications: Left anterior spillage Pharyngeal  Phase Impairments: Cough - Immediate;Cough - Delayed(x2 during trials)    Nectar Thick Nectar Thick Liquid: Within functional limits Presentation:  Cup;Self Fed(~4 ozs) Other Comments: mild delayed cough noted x1 but also after taking multiple sips and distracted   Honey Thick Honey Thick Liquid: Not tested   Puree Puree: Within functional limits Presentation: Spoon;Self Fed(assisted; 5 trials)   Solid   GO   Solid: Impaired Presentation: Self Fed;Spoon(7-8+ bites) Oral Phase Impairments: Reduced lingual movement/coordination;Impaired mastication;Poor awareness of bolus Oral Phase Functional Implications: Oral residue;Impaired mastication;Prolonged oral transit(reduced awareness for oral clearing) Pharyngeal Phase Impairments: Cough - Delayed(x2) Other Comments: suspect could be d/t increased saliva during increased oral phase time        Orinda Kenner, MS, CCC-SLP Watson,Katherine 03/28/2018,3:39 PM

## 2018-03-28 NOTE — Progress Notes (Signed)
PT Cancellation Note  Patient Details Name: Nicholas Elliott MRN: [id number] DOB: Apr 10, 1922   Cancelled Treatment:    Reason Eval/Treat Not Completed: Patient at procedure or test/unavailable(Evaluation re-attempted.  Patient currently off unit for diagnostic testing.  Will re-attempt at later time/date as medically appropriat eand available.)   Giulio Bertino H. Owens Shark, PT, DPT, NCS 03/28/18, 10:12 AM [phone]

## 2018-03-28 NOTE — NC FL2 (Signed)
[place] LEVEL OF CARE SCREENING TOOL     IDENTIFICATION  Patient Name: Nicholas Elliott Birthdate: Mar 10, 1922 Sex: male Admission Date (Current Location): 03/27/2018  [place] and [place] Number:  Engineering geologist and Address:  [facility], [address], [place], [place] [address]      Provider Number: [id number]  Attending Physician Name and Address:  Fritzi Mandes, MD  Relative Name and Phone Number:  Domique, Clapper   [phone]     Current Level of Care: Hospital Recommended Level of Care: [place] Prior Approval Number:    Date Approved/Denied:   PASRR Number: [id number]  Discharge Plan: SNF    Current Diagnoses: Patient Active Problem List   Diagnosis Date Noted  . CVA (cerebral vascular accident) ([place]) 03/28/2018  . Generalized weakness 03/27/2018  . Moderate aortic valve stenosis 04/12/2017  . Atherosclerotic peripheral vascular disease with intermittent claudication ([place]) 12/29/2015  . BPH with obstruction/lower urinary tract symptoms 12/21/2015  . Microscopic hematuria 12/21/2015  . Urge incontinence 12/21/2015  . Benign essential HTN 06/30/2015  . Cerebral vascular accident ([place]) 04/11/2015  . AF (paroxysmal atrial fibrillation) ([place]) 04/11/2015  . Episode of syncope 04/11/2015  . Cerebrovascular accident (CVA) ([place]) 04/11/2015  . Paroxysmal atrial fibrillation ([place]) 04/11/2015  . Left hip pain 04/03/2015  . Forehead contusion 04/03/2015  . Left elbow contusion 04/03/2015  . Hyperglycemia 04/03/2015  . Facial droop 04/02/2015  . Stroke ([place]) 04/02/2015  . High cholesterol   . TIA (transient ischemic attack)   . Arthritis   . Migraine   . CLL (chronic lymphocytic leukemia) ([place])   . Chronic lymphocytic leukemia ([place]) 09/16/2014  . DDD (degenerative disc disease), lumbar 09/11/2014  . Neuritis or radiculitis due to rupture of lumbar intervertebral disc 09/11/2014  . A-fib ([place])  04/08/2014  . Heart valve disease 04/08/2014  . HLD (hyperlipidemia) 04/08/2014  . Atrial fibrillation ([place]) 04/08/2014  . Carpal tunnel syndrome 01/23/2014  . DDD (degenerative disc disease), cervical 01/23/2014  . Cervical spinal stenosis 01/23/2014  . Degenerative arthritis of lumbar spine 01/23/2014  . Nerve root pain 01/23/2014  . Degeneration of intervertebral disc of cervical region 01/23/2014    Orientation RESPIRATION BLADDER Height & Weight     Self  Normal Incontinent Weight: 183 lb (83 kg) Height:  6' (182.9 cm)  BEHAVIORAL SYMPTOMS/MOOD NEUROLOGICAL BOWEL NUTRITION STATUS      Continent Diet(Regular diet)  AMBULATORY STATUS COMMUNICATION OF NEEDS Skin   Limited Assist Verbally Normal                       Personal Care Assistance Level of Assistance  Bathing, Feeding, Dressing Bathing Assistance: Limited assistance Feeding assistance: Independent Dressing Assistance: Limited assistance     Functional Limitations Info  Sight, Hearing, Speech Sight Info: Adequate Hearing Info: Adequate Speech Info: Adequate    SPECIAL CARE FACTORS FREQUENCY  OT (By licensed OT), PT (By licensed PT)     PT Frequency: 5x a week OT Frequency: 5x a week            Contractures Contractures Info: Not present    Additional Factors Info  Code Status, Allergies Code Status Info: DNR Allergies Info: MOXIFLOXACIN            Current Medications (03/28/2018):  This is the current hospital active medication list Current Facility-Administered Medications  Medication Dose Route Frequency Provider Last Rate Last Dose  . aspirin EC tablet 81 mg  81 mg Oral Daily Vaughan Basta, MD   81 mg at 03/28/18 7395  . docusate sodium (COLACE) capsule 100 mg  100 mg Oral BID PRN Vaughan Basta, MD   100 mg at 03/27/18 1650  . heparin injection 5,000 Units  5,000 Units Subcutaneous Q8H Vaughan Basta, MD   5,000 Units at 03/28/18 1449  . vitamin B-12  (CYANOCOBALAMIN) tablet 1,000 mcg  1,000 mcg Oral Daily Vaughan Basta, MD   1,000 mcg at 03/28/18 8441     Discharge Medications: Please see discharge summary for a list of discharge medications.  Relevant Imaging Results:  Relevant Lab Results:   Additional Information SSN [id number]  Ross Ludwig, [place]

## 2018-03-28 NOTE — Progress Notes (Signed)
*  PRELIMINARY RESULTS* Echocardiogram 2D Echocardiogram has been performed.  Nicholas Elliott 03/28/2018, 3:18 PM

## 2018-03-28 NOTE — Progress Notes (Signed)
OT Cancellation Note  Patient Details Name: Nicholas Elliott MRN: [id number] DOB: 28-Jun-1922   Cancelled Treatment:    Reason Eval/Treat Not Completed: Medical issues which prohibited therapy. Order received, chart reviewed. Pt noted with K+ 5.2. Per guidelines pt inappropriate for therapy at this time 2/2 elevated K+. Will continue to follow acutely and re-attempt at later date/time as pt is medically appropriate.   Jeni Salles, MPH, MS, OTR/L ascom [phone] 03/28/18, 11:54 AM

## 2018-03-28 NOTE — Progress Notes (Signed)
[facility] NAME: Nicholas Elliott    MR#:  [id number]  DATE OF BIRTH:  07-31-1922  SUBJECTIVE:  came in after feeling very weak and almost passed out around the commode. Found to have acute stroke. Wife in the room. Feels better alert no focal weakness other than left upper and lower extremity somewhat weak  REVIEW OF SYSTEMS:   Review of Systems  Constitutional: Negative for chills, fever and weight loss.  HENT: Negative for ear discharge, ear pain and nosebleeds.   Eyes: Negative for blurred vision, pain and discharge.  Respiratory: Negative for sputum production, shortness of breath, wheezing and stridor.   Cardiovascular: Negative for chest pain, palpitations, orthopnea and PND.  Gastrointestinal: Negative for abdominal pain, diarrhea, nausea and vomiting.  Genitourinary: Negative for frequency and urgency.  Musculoskeletal: Negative for back pain and joint pain.  Neurological: Positive for weakness. Negative for sensory change, speech change and focal weakness.  Psychiatric/Behavioral: Negative for depression and hallucinations. The patient is not nervous/anxious.    Tolerating Diet:yes Tolerating PT: pending  DRUG ALLERGIES:   Allergies  Allergen Reactions  . Moxifloxacin Other (See Comments)    Patient's dementia symptoms increased.    VITALS:  Blood pressure 133/82, pulse 82, temperature (!) 97.1 F (36.2 C), temperature source Oral, resp. rate 18, height 6' (1.829 m), weight 83 kg (183 lb), SpO2 96 %.  PHYSICAL EXAMINATION:   Physical Exam  GENERAL:  84 y.o.-year-old patient lying in the bed with no acute distress.  EYES: Pupils equal, round, reactive to light and accommodation. No scleral icterus. Extraocular muscles intact.  HEENT: Head atraumatic, normocephalic. Oropharynx and nasopharynx clear.  NECK:  Supple, no jugular venous distention. No thyroid enlargement, no tenderness.  LUNGS: Normal breath  sounds bilaterally, no wheezing, rales, rhonchi. No use of accessory muscles of respiration.  CARDIOVASCULAR: S1, S2 normal. No murmurs, rubs, or gallops.  ABDOMEN: Soft, nontender, nondistended. Bowel sounds present. No organomegaly or mass.  EXTREMITIES: No cyanosis, clubbing or edema b/l.    NEUROLOGIC: Cranial nerves II through XII are intact. Upper and lower extremity weakness 4/5. PSYCHIATRIC:  patient is alert and oriented x 3.  SKIN: No obvious rash, lesion, or ulcer.   LABORATORY PANEL:  CBC Recent Labs  Lab 03/28/18 0509  WBC 98.0*  HGB 14.6  HCT 45.4  PLT 177    Chemistries  Recent Labs  Lab 03/27/18 0846 03/28/18 0509  NA 142 145  K 4.5 5.2*  CL 109 109  CO2 27 29  GLUCOSE 91 85  BUN 20 13  CREATININE 0.78 0.80  CALCIUM 8.6* 8.9  AST 26  --   ALT 10  --   ALKPHOS 49  --   BILITOT 0.9  --    Cardiac Enzymes Recent Labs  Lab 03/27/18 0846  TROPONINI <0.03   RADIOLOGY:  Mr Brain Wo Contrast  Result Date: 03/27/2018 CLINICAL DATA:  84 y/o  M; fall, weakness, history of stroke. EXAM: MRI HEAD WITHOUT CONTRAST TECHNIQUE: Multiplanar, multiecho pulse sequences of the brain and surrounding structures were obtained without intravenous contrast. COMPARISON:  03/08/2018 CT head.  04/02/2015 MRI head. FINDINGS: Brain: Linear loose diffusion in the right paramedian pons compatible with acute/early subacute infarction. No associated hemorrhage or mass effect. Multiple areas of chronic infarction are present within the right lateral frontal lobe, right anterior insula/basal ganglia, and the right parietal lobe. Mild chronic microvascular ischemic changes of the brain and moderate brain parenchymal  volume loss. No evidence of acute intracranial hemorrhage, extra-axial collection, hydrocephalus, or herniation. Very small chronic infarctions are present in the bilateral cerebellar hemispheres, the right thalamus, and the left mid corona radiata. Vascular: Normal flow voids.  Skull and upper cervical spine: Normal marrow signal. Sinuses/Orbits: Right mastoid effusion. No abnormal signal of the left mastoid air cells or paranasal sinuses. Other: None. IMPRESSION: 1. Right paramedian pontine acute/early subacute infarction. No associated hemorrhage or mass effect. 2. Multiple chronic infarcts, microvascular ischemic changes, brain parenchymal volume loss as above. 3. Right mastoid effusion. These results will be called to the ordering clinician or representative by the Radiologist Assistant, and communication documented in the PACS or zVision Dashboard. Electronically Signed   By: Kristine Garbe M.D.   On: 03/27/2018 21:43   US Carotid Bilateral  Result Date: 03/28/2018 CLINICAL DATA:  CVA.  Syncopal episode.  History of hyperlipidemia. EXAM: BILATERAL CAROTID DUPLEX ULTRASOUND TECHNIQUE: Pearline Cables scale imaging, color Doppler and duplex ultrasound were performed of bilateral carotid and vertebral arteries in the neck. COMPARISON:  None. FINDINGS: Criteria: Quantification of carotid stenosis is based on velocity parameters that correlate the residual internal carotid diameter with NASCET-based stenosis levels, using the diameter of the distal internal carotid lumen as the denominator for stenosis measurement. The following velocity measurements were obtained: RIGHT ICA:  86/20 cm/sec CCA:  32/20 cm/sec SYSTOLIC ICA/CCA RATIO:  1.8 ECA:  87 cm/sec LEFT ICA:  102/13 cm/sec CCA:  25/4 cm/sec SYSTOLIC ICA/CCA RATIO:  1.6 ECA:  98 cm/sec RIGHT CAROTID ARTERY: There is a minimal amount of atherosclerotic plaque within the right carotid bulb (image 16), extending to involve the origin and proximal aspects the right internal carotid artery (image 23), not resulting in elevated peak systolic velocities within the interrogated course of the right internal carotid artery to suggest a hemodynamically significant stenosis. RIGHT VERTEBRAL ARTERY:  Antegrade flow LEFT CAROTID ARTERY: There is a  moderate to large amount of left-sided atherosclerotic plaque (image 48), extending to involve the origin and proximal aspect the left internal carotid artery (image 55), not resulting in elevated peak systolic velocities within the interrogated course of the left internal carotid artery to suggest a hemodynamically significant stenosis. LEFT VERTEBRAL ARTERY:  Antegrade flow IMPRESSION: 1. Moderate to large amount of left-sided atherosclerotic plaque, not resulting in a hemodynamically significant stenosis. 2. Minimal amount of right-sided atherosclerotic plaque, not resulting in a hemodynamically significant stenosis. Electronically Signed   By: Sandi Mariscal M.D.   On: 03/28/2018 11:07   Dg Chest Port 1 View  Result Date: 03/27/2018 CLINICAL DATA:  Fall EXAM: PORTABLE CHEST 1 VIEW COMPARISON:  11/24/2013 FINDINGS: Mild right basilar opacity, likely atelectasis. No focal consolidation. No pleural effusion or pneumothorax. Heart is normal in size. Visualized osseous structures are within normal limits. IMPRESSION: No evidence of acute cardiopulmonary disease. Electronically Signed   By: Julian Hy M.D.   On: 03/27/2018 09:05   ASSESSMENT AND PLAN:  Gibran Veselka  is a 84 y.o. male with a known history of A fib, Arthritis, CLL< Glaucoma, dementia. CVA, HLD- Lives with wife,a ble to walk with a cane. Last night could not get up in bathroom and fell from commode.  1. Acute right pons lacunar infarct -patient presented with sudden onset weakness. His MRI shows previous multiple strokes. -Pt has history of chronic a fib. Takes only a baby aspirin. Not sure why he is not on chronic anticoagulation. Wife outpatient were not able to explain. -Ultrasound carotid negative for any stenosis.  Patient has significant atherosclerosis. -Case discussed with Dr. Doy Mince.--- Recommends anticoagulation however will wait for Dr. Doy Mince to see patient and follow-up -PT, OT, speech  2. chronic a fib -rate  controlled. Patient is not on any rate blocking agent. -He is not on any anticoagulation -H&H stable  3 h/o CLL Has chr0nically elevated WBC  4.mild hyperkalemia Check K   CM for d/c planning  Case discussed with Care Management/Social Worker. Management plans discussed with the patient, family and they are in agreement.  CODE STATUS: DNR  DVT Prophylaxis: lovenox  TOTAL TIME TAKING CARE OF THIS PATIENT: *30* minutes.  >50% time spent on counselling and coordination of care  POSSIBLE D/C IN *1-2* DAYS, DEPENDING ON CLINICAL CONDITION.  Note: This dictation was prepared with Dragon dictation along with smaller phrase technology. Any transcriptional errors that result from this process are unintentional.  Fritzi Mandes M.D on 03/28/2018 at 2:16 PM  Between 7am to 6pm - Pager - [phone]  After 6pm go to [email or web address] - password [email or web address] [facility]  Office  [phone]  CC: Primary care physician; Tracie Harrier, MDPatient ID: Nicholas Elliott, male   DOB: Nov 07, 1921, 84 y.o.   MRN: [id number]

## 2018-03-28 NOTE — Consult Note (Signed)
Referring Physician: Posey Pronto    Chief Complaint: Weakness  HPI: Nicholas Elliott is an 84 y.o. male with a history of atrial fibrillation on ASA who on last evening could not get up in bathroom and fell from commode. Wife Called EMS, they helped him to get back to bed.  Patient was not brought in at that time.  Again yesterday morning was very weak and could not get out of bed therefore was  brought to the ER for further evaluation.  Initial NIHSS of 4.   Date last known well: Date: 03/26/2018 Time last known well: Time: 22:30 tPA Given: No: Outside time window  Past Medical History:  Diagnosis Date  . A-fib ([place]) 04/08/2014  . AF (paroxysmal atrial fibrillation) ([place]) 04/11/2015  . Arthritis   . Arthritis   . Carpal tunnel syndrome 01/23/2014  . Cerebral vascular accident ([place]) 04/11/2015  . Cervical spinal stenosis 01/23/2014  . CLL (chronic lymphocytic leukemia) ([place])   . CLL (chronic lymphocytic leukemia) ([place])   . DDD (degenerative disc disease), cervical 01/23/2014  . DDD (degenerative disc disease), lumbar 09/11/2014  . Degenerative arthritis of lumbar spine 01/23/2014  . Episode of syncope 04/11/2015  . Facial droop 04/02/2015  . Forehead contusion 04/03/2015  . Glaucoma   . Heart valve disease 04/08/2014  . High cholesterol   . High cholesterol   . HLD (hyperlipidemia) 04/08/2014  . Hyperglycemia 04/03/2015  . Left elbow contusion 04/03/2015  . Left hip pain 04/03/2015  . Migraine   . Migraine   . Nerve root pain 01/23/2014  . Neuritis or radiculitis due to rupture of lumbar intervertebral disc 09/11/2014  . Stroke ([place]) 04/02/2015  . TIA (transient ischemic attack)   . TIA (transient ischemic attack)     Past Surgical History:  Procedure Laterality Date  . HERNIA REPAIR    . SPINE SURGERY    . upper palate reparin      Family History  Problem Relation Age of Onset  . Stroke Father   . Heart attack Brother   . Osteoarthritis Mother   . Cancer Sister   . Prostate cancer Neg Hx    . Kidney cancer Neg Hx   . Bladder Cancer Neg Hx    Social History:  reports that he has never smoked. He has never used smokeless tobacco. He reports that he does not drink alcohol or use drugs.  Allergies:  Allergies  Allergen Reactions  . Moxifloxacin Other (See Comments)    Patient's dementia symptoms increased.    Medications:  I have reviewed the patient's current medications. Prior to Admission:  Medications Prior to Admission  Medication Sig Dispense Refill Last Dose  . aspirin EC 81 MG tablet Take 81 mg by mouth daily.    Unknown  . vitamin B-12 (CYANOCOBALAMIN) 1000 MCG tablet Take 1,000 mcg by mouth daily.     . fesoterodine (TOVIAZ) 4 MG TB24 tablet Take 1 tablet (4 mg total) by mouth daily. (Patient not taking: Reported on 03/27/2018) 30 tablet 3 Not Taking at Unknown time   Scheduled: . aspirin EC  81 mg Oral Daily  . heparin  5,000 Units Subcutaneous Q8H  . vitamin B-12  1,000 mcg Oral Daily    ROS: History obtained from the patient  General ROS: negative for - chills, fatigue, fever, night sweats, weight gain or weight loss Psychological ROS: negative for - behavioral disorder, hallucinations, memory difficulties, mood swings or suicidal ideation Ophthalmic ROS: negative for - blurry vision, double vision,  eye pain or loss of vision ENT ROS: HOH Allergy and Immunology ROS: negative for - hives or itchy/watery eyes Hematological and Lymphatic ROS: negative for - bleeding problems, bruising or swollen lymph nodes Endocrine ROS: negative for - galactorrhea, hair pattern changes, polydipsia/polyuria or temperature intolerance Respiratory ROS: negative for - cough, hemoptysis, shortness of breath or wheezing Cardiovascular ROS: negative for - chest pain, dyspnea on exertion, edema or irregular heartbeat Gastrointestinal ROS: negative for - abdominal pain, diarrhea, hematemesis, nausea/vomiting or stool incontinence Genito-Urinary ROS: negative for - dysuria,  hematuria, incontinence or urinary frequency/urgency Musculoskeletal ROS: negative for - joint swelling or muscular weakness Neurological ROS: as noted in HPI Dermatological ROS: negative for rash and skin lesion changes  Physical Examination: Blood pressure 133/82, pulse 82, temperature (!) 97.1 F (36.2 C), temperature source Oral, resp. rate 18, height 6' (1.829 m), weight 83 kg (183 lb), SpO2 96 %.  HEENT-  Normocephalic, no lesions, without obvious abnormality.  Normal external eye and conjunctiva.  Normal TM's bilaterally.  Normal auditory canals and external ears. Normal external nose, mucus membranes and septum.  Normal pharynx. Cardiovascular- S1, S2 normal, pulses palpable throughout   Lungs- chest clear, no wheezing, rales, normal symmetric air entry Abdomen- soft, non-tender; bowel sounds normal; no masses,  no organomegaly Extremities- no edema Lymph-no adenopathy palpable Musculoskeletal-no joint tenderness, deformity or swelling Skin-warm and dry, no hyperpigmentation, vitiligo, or suspicious lesions  Neurological Examination   Mental Status: Alert, slow to answer questions (unclear if due to hearing). and at times appears to have some confusion.  Speech minimal.  Follows simple commands and requires extensive reinforcement for 3 step commands Cranial Nerves: II: Discs flat bilaterally; Visual fields grossly normal, pupils equal, round, reactive to light and accommodation III,IV, VI: ptosis not present, extra-ocular motions intact bilaterally V,VII: left facial droop, facial light touch sensation normal bilaterally VIII: hearing normal bilaterally IX,X: gag reflex present XI: bilateral shoulder shrug XII: midline tongue extension Motor: Right : Upper extremity   5/5    Left:     Upper extremity   4/5  Lower extremity   5-/5     Lower extremity   5-/5 Tone and bulk:normal tone throughout; no atrophy noted Sensory: Pinprick and light touch intact throughout,  bilaterally Deep Tendon Reflexes: 2+ and symmetric with absent AJ's bilaterally Plantars: Right: downgoing   Left: upgoing Cerebellar: normal finger-to-nose and normal heel-to-shin testing bilaterally Gait: not tested due to safety concerns    Laboratory Studies:  Basic Metabolic Panel: Recent Labs  Lab 03/27/18 0846 03/28/18 0509  NA 142 145  K 4.5 5.2*  CL 109 109  CO2 27 29  GLUCOSE 91 85  BUN 20 13  CREATININE 0.78 0.80  CALCIUM 8.6* 8.9    Liver Function Tests: Recent Labs  Lab 03/27/18 0846  AST 26  ALT 10  ALKPHOS 49  BILITOT 0.9  PROT 6.2*  ALBUMIN 3.8   Recent Labs  Lab 03/27/18 0846  LIPASE 37   No results for input(s): AMMONIA in the last 168 hours.  CBC: Recent Labs  Lab 03/27/18 0846 03/28/18 0509  WBC 84.6* 98.0*  HGB 13.7 14.6  HCT 42.6 45.4  MCV 99.4 98.7  PLT 163 177    Cardiac Enzymes: Recent Labs  Lab 03/27/18 0846  TROPONINI <0.03    BNP: Invalid input(s): POCBNP  CBG: No results for input(s): GLUCAP in the last 168 hours.  Microbiology: Results for orders placed or performed in visit on 12/19/15  Microscopic Examination  Status: Abnormal   Collection Time: 12/19/15 11:28 AM  Result Value Ref Range Status   WBC, UA 0-5 0 - 5 /hpf Final   RBC, UA 3-10 (A) 0 - 2 /hpf Final   Epithelial Cells (non renal) None seen 0 - 10 /hpf Final   Bacteria, UA Few (A) None seen/Few Final    Coagulation Studies: No results for input(s): LABPROT, INR in the last 72 hours.  Urinalysis:  Recent Labs  Lab 03/27/18 0909  COLORURINE YELLOW*  LABSPEC 1.011  PHURINE 7.0  GLUCOSEU NEGATIVE  HGBUR SMALL*  BILIRUBINUR NEGATIVE  KETONESUR NEGATIVE  PROTEINUR NEGATIVE  NITRITE NEGATIVE  LEUKOCYTESUR NEGATIVE    Lipid Panel:    Component Value Date/Time   CHOL 134 04/03/2015 0012   TRIG 31 04/03/2015 0012   HDL 48 04/03/2015 0012   CHOLHDL 2.8 04/03/2015 0012   VLDL 6 04/03/2015 0012   LDLCALC 80 04/03/2015 0012     HgbA1C:  Lab Results  Component Value Date   HGBA1C 6.1 (H) 04/03/2015    Urine Drug Screen:      Component Value Date/Time   LABOPIA NONE DETECTED 04/02/2015 2020   COCAINSCRNUR NONE DETECTED 04/02/2015 2020   LABBENZ NONE DETECTED 04/02/2015 2020   AMPHETMU NONE DETECTED 04/02/2015 2020   THCU NONE DETECTED 04/02/2015 2020   LABBARB NONE DETECTED 04/02/2015 2020    Alcohol Level: No results for input(s): ETH in the last 168 hours.  Other results: EKG: atrial fibrillation, rate 90 bpm.  Imaging: Mr Brain Wo Contrast  Result Date: 03/27/2018 CLINICAL DATA:  84 y/o  M; fall, weakness, history of stroke. EXAM: MRI HEAD WITHOUT CONTRAST TECHNIQUE: Multiplanar, multiecho pulse sequences of the brain and surrounding structures were obtained without intravenous contrast. COMPARISON:  03/08/2018 CT head.  04/02/2015 MRI head. FINDINGS: Brain: Linear loose diffusion in the right paramedian pons compatible with acute/early subacute infarction. No associated hemorrhage or mass effect. Multiple areas of chronic infarction are present within the right lateral frontal lobe, right anterior insula/basal ganglia, and the right parietal lobe. Mild chronic microvascular ischemic changes of the brain and moderate brain parenchymal volume loss. No evidence of acute intracranial hemorrhage, extra-axial collection, hydrocephalus, or herniation. Very small chronic infarctions are present in the bilateral cerebellar hemispheres, the right thalamus, and the left mid corona radiata. Vascular: Normal flow voids. Skull and upper cervical spine: Normal marrow signal. Sinuses/Orbits: Right mastoid effusion. No abnormal signal of the left mastoid air cells or paranasal sinuses. Other: None. IMPRESSION: 1. Right paramedian pontine acute/early subacute infarction. No associated hemorrhage or mass effect. 2. Multiple chronic infarcts, microvascular ischemic changes, brain parenchymal volume loss as above. 3. Right  mastoid effusion. These results will be called to the ordering clinician or representative by the Radiologist Assistant, and communication documented in the PACS or zVision Dashboard. Electronically Signed   By: Kristine Garbe M.D.   On: 03/27/2018 21:43   US Carotid Bilateral  Result Date: 03/28/2018 CLINICAL DATA:  CVA.  Syncopal episode.  History of hyperlipidemia. EXAM: BILATERAL CAROTID DUPLEX ULTRASOUND TECHNIQUE: Pearline Cables scale imaging, color Doppler and duplex ultrasound were performed of bilateral carotid and vertebral arteries in the neck. COMPARISON:  None. FINDINGS: Criteria: Quantification of carotid stenosis is based on velocity parameters that correlate the residual internal carotid diameter with NASCET-based stenosis levels, using the diameter of the distal internal carotid lumen as the denominator for stenosis measurement. The following velocity measurements were obtained: RIGHT ICA:  86/20 cm/sec CCA:  36/64 cm/sec SYSTOLIC ICA/CCA  RATIO:  1.8 ECA:  87 cm/sec LEFT ICA:  102/13 cm/sec CCA:  67/8 cm/sec SYSTOLIC ICA/CCA RATIO:  1.6 ECA:  98 cm/sec RIGHT CAROTID ARTERY: There is a minimal amount of atherosclerotic plaque within the right carotid bulb (image 16), extending to involve the origin and proximal aspects the right internal carotid artery (image 23), not resulting in elevated peak systolic velocities within the interrogated course of the right internal carotid artery to suggest a hemodynamically significant stenosis. RIGHT VERTEBRAL ARTERY:  Antegrade flow LEFT CAROTID ARTERY: There is a moderate to large amount of left-sided atherosclerotic plaque (image 48), extending to involve the origin and proximal aspect the left internal carotid artery (image 55), not resulting in elevated peak systolic velocities within the interrogated course of the left internal carotid artery to suggest a hemodynamically significant stenosis. LEFT VERTEBRAL ARTERY:  Antegrade flow IMPRESSION: 1. Moderate  to large amount of left-sided atherosclerotic plaque, not resulting in a hemodynamically significant stenosis. 2. Minimal amount of right-sided atherosclerotic plaque, not resulting in a hemodynamically significant stenosis. Electronically Signed   By: Sandi Mariscal M.D.   On: 03/28/2018 11:07   Dg Chest Port 1 View  Result Date: 03/27/2018 CLINICAL DATA:  Fall EXAM: PORTABLE CHEST 1 VIEW COMPARISON:  11/24/2013 FINDINGS: Mild right basilar opacity, likely atelectasis. No focal consolidation. No pleural effusion or pneumothorax. Heart is normal in size. Visualized osseous structures are within normal limits. IMPRESSION: No evidence of acute cardiopulmonary disease. Electronically Signed   By: Julian Hy M.D.   On: 03/27/2018 09:05    Assessment: 84 y.o. male with a history of afib on ASA admitted with weakness.  MRI of the brain reviewed and shows an acute right pontine infarct.  Due to history of afib likely embolic in etiology.  Carotid dopplers show no evidence of hemodynamically significant stenosis but with moderate to large amount of plaque on the left.  Echocardiogram shows no cardiac source of emboli with an EF of 50-60%.   Stroke Risk Factors - atrial fibrillation and hyperlipidemia  Plan: 1. HgbA1c, fasting lipid panel 2. PT consult, OT consult, Speech consult 3. Prophylactic therapy-Eliquis 4. NPO until RN stroke swallow screen 5. Telemetry monitoring 6. Frequent neuro checks   Alexis Goodell, MD Neurology [phone] 03/28/2018, 2:09 PM

## 2018-03-28 NOTE — Evaluation (Signed)
Physical Therapy Evaluation Patient Details Name: Nicholas Elliott MRN: [id number] DOB: 1921/09/28 Today's Date: 03/28/2018   History of Present Illness  presented to ER secondary to fall in bathroom, progressive weakness; admitted for TIA/CVA work up.  MRI significant for R paramedian pons infarct  Clinical Impression  Upon evaluation, patient alert and oriented to self, general location; follows simple commands, but requires increased time for processing and task initiation.  Generalized weakness throughout L hemi-body (UE > LE) with noted deficits in speed/strength of activation, motor control and coordination.  Mod weakness of L lateral trunk noted; significant pushing behaviors towards L noted in sitting/standing postures (requiring mod/max cuing/support for correction).  Currently requiring mod/max assist for bed mobility; mod assist for static sitting balance; mod/max assist +2 with bilat HHA for sit/stand and static standing balance.  Very resistive to R ant/lateral weight shift; unsafe/unable to advance or attempt any stepping or gait activities. Would benefit from skilled PT to address above deficits and promote optimal return to PLOF; recommend transition to STR upon discharge from acute hospitalization.     Follow Up Recommendations SNF    Equipment Recommendations       Recommendations for Other Services       Precautions / Restrictions Precautions Precautions: Fall Restrictions Weight Bearing Restrictions: No      Mobility  Bed Mobility Overal bed mobility: Needs Assistance Bed Mobility: Supine to Sit;Sit to Supine     Supine to sit: Mod assist Sit to supine: Mod assist;Max assist      Transfers Overall transfer level: Needs assistance Equipment used: 2 person hand held assist Transfers: Sit to/from Stand Sit to Stand: Mod assist;+2 physical assistance         General transfer comment: poor lumbar extension/anterior pelvic tilt; heavy posterior weight shift  with pushing behaviors towards L (fully unweighting L LE off floor at times)  Ambulation/Gait             General Gait Details: unsafe/unable  Stairs            Wheelchair Mobility    Modified Rankin (Stroke Patients Only)       Balance Overall balance assessment: Needs assistance Sitting-balance support: No upper extremity supported;Feet supported Sitting balance-Leahy Scale: Poor Sitting balance - Comments: L posterior/lateral lean; flexed posture with limited truncal mobility/dissociation     Standing balance-Leahy Scale: Zero Standing balance comment: pushing towards L; very high fall risk; poor midline                             Pertinent Vitals/Pain Pain Assessment: Faces Faces Pain Scale: No hurt    Home Living Family/patient expects to be discharged to:: Private residence Living Arrangements: Spouse/significant other Available Help at Discharge: Family Type of Home: House Home Access: Stairs to enter Entrance Stairs-Rails: Left Entrance Stairs-Number of Steps: 4 Home Layout: One level Home Equipment: Cane - single point      Prior Function Level of Independence: Independent with assistive device(s)         Comments: Mod indep for ADLs, household mobilization with SPC; endorses at least 3 falls within previous six months     Hand Dominance        Extremity/Trunk Assessment   Upper Extremity Assessment Upper Extremity Assessment: (L UE grossly 3-/5; noted delay in speed of activation and coordination, fine motor control.  Sensation intact; suspect mild L inattention)    Lower Extremity Assessment Lower Extremity Assessment: (L  LE grossly 3- to 4+/5; poor functional coordination and motor planning.  Sensation fully intact; positive babinski)       Communication   Communication: (dysarthric)  Cognition Arousal/Alertness: Awake/alert Behavior During Therapy: WFL for tasks assessed/performed Overall Cognitive Status:  Within Functional Limits for tasks assessed                                 General Comments: mild delay in response time, limited insight into deficits      General Comments      Exercises Other Exercises Other Exercises: Unsupported sitting, worked to develop awareness of position and initaition of self-righting in both frontal and saggital planes.  Mod assist from therapist at all times.  Excessive L posterior/lateral weight shift; may benefit from wedge under L IT to promote improved truncal alignment in subsequent sessions   Assessment/Plan    PT Assessment Patient needs continued PT services  PT Problem List Decreased strength;Decreased range of motion;Decreased activity tolerance;Decreased balance;Decreased mobility;Decreased cognition;Decreased coordination;Decreased knowledge of use of DME;Decreased safety awareness;Decreased knowledge of precautions;Impaired sensation       PT Treatment Interventions DME instruction;Gait training;Functional mobility training;Therapeutic activities;Balance training;Neuromuscular re-education;Cognitive remediation;Patient/family education;Therapeutic exercise    PT Goals (Current goals can be found in the Care Plan section)  Acute Rehab PT Goals Patient Stated Goal: to try PT Goal Formulation: With patient Time For Goal Achievement: 04/11/18 Potential to Achieve Goals: Fair Additional Goals Additional Goal #1: Assess and establish goals for OOB/mobility as appropriate.    Frequency 7X/week   Barriers to discharge Decreased caregiver support      Co-evaluation               AM-PAC PT "6 Clicks" Daily Activity  Outcome Measure Difficulty turning over in bed (including adjusting bedclothes, sheets and blankets)?: Unable Difficulty moving from lying on back to sitting on the side of the bed? : Unable Difficulty sitting down on and standing up from a chair with arms (e.g., wheelchair, bedside commode, etc,.)?:  Unable Help needed moving to and from a bed to chair (including a wheelchair)?: Total Help needed walking in hospital room?: Total Help needed climbing 3-5 steps with a railing? : Total 6 Click Score: 6    End of Session Equipment Utilized During Treatment: Gait belt Activity Tolerance: Patient tolerated treatment well Patient left: in bed;with bed alarm set;with call bell/phone within reach(SLP at bedside) Nurse Communication: Mobility status PT Visit Diagnosis: Muscle weakness (generalized) (M62.81);Difficulty in walking, not elsewhere classified (R26.2);Hemiplegia and hemiparesis Hemiplegia - Right/Left: Left Hemiplegia - dominant/non-dominant: Non-dominant Hemiplegia - caused by: Cerebral infarction    Time: 4627-0350 PT Time Calculation (min) (ACUTE ONLY): 30 min   Charges:   PT Evaluation $PT Eval Moderate Complexity: 1 Mod PT Treatments $Neuromuscular Re-education: 8-22 mins   PT G Codes:        Elliett Guarisco H. Owens Shark, PT, DPT, NCS 03/28/18, 5:28 PM [phone]

## 2018-03-29 ENCOUNTER — Inpatient Hospital Stay: Payer: Medicare Other

## 2018-03-29 ENCOUNTER — Inpatient Hospital Stay: Payer: Medicare Other | Admitting: Oncology

## 2018-03-29 LAB — LIPID PANEL
CHOL/HDL RATIO: 6.5 ratio
CHOLESTEROL: 279 mg/dL — AB (ref 0–200)
HDL: 43 mg/dL (ref >40)
LDL Cholesterol: 219 mg/dL — ABNORMAL HIGH (ref 0–99)
TRIGLYCERIDES: 83 mg/dL (ref <150)
VLDL: 17 mg/dL (ref 0–40)

## 2018-03-29 LAB — POTASSIUM: POTASSIUM: 4.5 mmol/L (ref 3.5–5.1)

## 2018-03-29 MED ORDER — DILTIAZEM HCL 60 MG PO TABS
60.0000 mg | ORAL_TABLET | Freq: Two times a day (BID) | ORAL | Status: DC
  Administered 2018-03-29: 60 mg via ORAL
  Filled 2018-03-29: qty 1
  Filled 2018-03-29: qty 2
  Filled 2018-03-29: qty 1

## 2018-03-29 MED ORDER — APIXABAN 5 MG PO TABS: 5.0000 mg | ORAL_TABLET | Freq: Two times a day (BID) | ORAL | 1 refills | Status: AC

## 2018-03-29 MED ORDER — ATORVASTATIN CALCIUM 40 MG PO TABS: 40.0000 mg | ORAL_TABLET | Freq: Every day | ORAL | 1 refills | Status: AC

## 2018-03-29 MED ORDER — APIXABAN 5 MG PO TABS
5.0000 mg | ORAL_TABLET | Freq: Two times a day (BID) | ORAL | Status: DC
  Administered 2018-03-29: 09:00:00 5 mg via ORAL
  Filled 2018-03-29: qty 1

## 2018-03-29 MED ORDER — ATORVASTATIN CALCIUM 20 MG PO TABS: 40.0000 mg | ORAL_TABLET | Freq: Every day | ORAL | Status: DC

## 2018-03-29 MED ORDER — DILTIAZEM HCL 60 MG PO TABS: 60.0000 mg | ORAL_TABLET | Freq: Two times a day (BID) | ORAL | 1 refills | Status: AC

## 2018-03-29 NOTE — Clinical Social Work Placement (Signed)
   CLINICAL SOCIAL WORK PLACEMENT  NOTE  Date:  03/29/2018  Patient Details  Name: Nicholas Elliott MRN: [id number] Date of Birth: 01/17/1922  Clinical Social Work is seeking post-discharge placement for this patient at the [place] level of care (*CSW will initial, date and re-position this form in  chart as items are completed):  Yes   Patient/family provided with [facility] Work Department's list of facilities offering this level of care within the geographic area requested by the patient (or if unable, by the patient's family).  Yes   Patient/family informed of their freedom to choose among providers that offer the needed level of care, that participate in Medicare, Medicaid or managed care program needed by the patient, have an available bed and are willing to accept the patient.  Yes   Patient/family informed of 's ownership interest in [facility] and [facility], as well as of the fact that they are under no obligation to receive care at these facilities.  PASRR submitted to EDS on 03/29/18     PASRR number received on       Existing PASRR number confirmed on 03/29/18     FL2 transmitted to all facilities in geographic area requested by pt/family on       FL2 transmitted to all facilities within larger geographic area on       Patient informed that his/her managed care company has contracts with or will negotiate with certain facilities, including the following:        Yes   Patient/family informed of bed offers received.  Patient chooses bed at [facility]     Physician recommends and patient chooses bed at      Patient to be transferred to [facility] on 03/29/18.  Patient to be transferred to facility by [facility]     Patient family notified on 03/29/18 of transfer.  Name of family member notified:  Patient's wife [name]     PHYSICIAN Please sign FL2, Please sign DNR, Please prepare  prescriptions     Additional Comment:    _______________________________________________ Ross Ludwig, LCSWA 03/29/2018, 5:42 PM

## 2018-03-29 NOTE — Progress Notes (Signed)
[place] [facility] [place] NAME: Nicholas Elliott    MR#:  [id number]  DATE OF BIRTH:  02/19/1922  SUBJECTIVE:  More left UE weakness today Per RN HR in the 200's last nite. Was started on po CCB Not talking much  REVIEW OF SYSTEMS:   Review of Systems  Unable to perform ROS: Mental status change   Tolerating Diet:on dysphagia 3 nectar consistency  Tolerating PT: REHAB  DRUG ALLERGIES:   Allergies  Allergen Reactions  . Moxifloxacin Other (See Comments)    Patient's dementia symptoms increased.    VITALS:  Blood pressure (!) 152/75, pulse 93, temperature 98.1 F (36.7 C), temperature source Oral, resp. rate (!) 6, height 6' (1.829 m), weight 83 kg (183 lb), SpO2 96 %.  PHYSICAL EXAMINATION:   Physical Exam  GENERAL:  84 y.o.-year-old patient lying in the bed with no acute distress. Not much verbal EYES: Pupils equal, round, reactive to light and accommodation. No scleral icterus. Extraocular muscles intact.  HEENT: Head atraumatic, normocephalic. Oropharynx and nasopharynx clear.  NECK:  Supple, no jugular venous distention. No thyroid enlargement, no tenderness.  LUNGS: Normal breath sounds bilaterally, no wheezing, rales, rhonchi. No use of accessory muscles of respiration.  CARDIOVASCULAR: S1, S2 normal. No murmurs, rubs, or gallops.  ABDOMEN: Soft, nontender, nondistended. Bowel sounds present. No organomegaly or mass.  EXTREMITIES: No cyanosis, clubbing or edema b/l.    NEUROLOGIC:limited exam--le UE weakness 3/5 and weak left LE PSYCHIATRIC:  patient is lethargic SKIN: No obvious rash, lesion, or ulcer.   LABORATORY PANEL:  CBC Recent Labs  Lab 03/28/18 0509  WBC 98.0*  HGB 14.6  HCT 45.4  PLT 177    Chemistries  Recent Labs  Lab 03/27/18 0846 03/28/18 0509 03/29/18 0527  NA 142 145  --   K 4.5 5.2* 4.5  CL 109 109  --   CO2 27 29  --   GLUCOSE 91 85  --   BUN 20 13  --   CREATININE 0.78 0.80  --    CALCIUM 8.6* 8.9  --   AST 26  --   --   ALT 10  --   --   ALKPHOS 49  --   --   BILITOT 0.9  --   --    Cardiac Enzymes Recent Labs  Lab 03/27/18 0846  TROPONINI <0.03   RADIOLOGY:  Mr Brain Wo Contrast  Result Date: 03/27/2018 CLINICAL DATA:  84 y/o  M; fall, weakness, history of stroke. EXAM: MRI HEAD WITHOUT CONTRAST TECHNIQUE: Multiplanar, multiecho pulse sequences of the brain and surrounding structures were obtained without intravenous contrast. COMPARISON:  03/08/2018 CT head.  04/02/2015 MRI head. FINDINGS: Brain: Linear loose diffusion in the right paramedian pons compatible with acute/early subacute infarction. No associated hemorrhage or mass effect. Multiple areas of chronic infarction are present within the right lateral frontal lobe, right anterior insula/basal ganglia, and the right parietal lobe. Mild chronic microvascular ischemic changes of the brain and moderate brain parenchymal volume loss. No evidence of acute intracranial hemorrhage, extra-axial collection, hydrocephalus, or herniation. Very small chronic infarctions are present in the bilateral cerebellar hemispheres, the right thalamus, and the left mid corona radiata. Vascular: Normal flow voids. Skull and upper cervical spine: Normal marrow signal. Sinuses/Orbits: Right mastoid effusion. No abnormal signal of the left mastoid air cells or paranasal sinuses. Other: None. IMPRESSION: 1. Right paramedian pontine acute/early subacute infarction. No associated hemorrhage or mass effect. 2. Multiple  chronic infarcts, microvascular ischemic changes, brain parenchymal volume loss as above. 3. Right mastoid effusion. These results will be called to the ordering clinician or representative by the Radiologist Assistant, and communication documented in the PACS or zVision Dashboard. Electronically Signed   By: Kristine Garbe M.D.   On: 03/27/2018 21:43   US Carotid Bilateral  Result Date: 03/28/2018 CLINICAL DATA:  CVA.   Syncopal episode.  History of hyperlipidemia. EXAM: BILATERAL CAROTID DUPLEX ULTRASOUND TECHNIQUE: Pearline Cables scale imaging, color Doppler and duplex ultrasound were performed of bilateral carotid and vertebral arteries in the neck. COMPARISON:  None. FINDINGS: Criteria: Quantification of carotid stenosis is based on velocity parameters that correlate the residual internal carotid diameter with NASCET-based stenosis levels, using the diameter of the distal internal carotid lumen as the denominator for stenosis measurement. The following velocity measurements were obtained: RIGHT ICA:  86/20 cm/sec CCA:  88/41 cm/sec SYSTOLIC ICA/CCA RATIO:  1.8 ECA:  87 cm/sec LEFT ICA:  102/13 cm/sec CCA:  66/0 cm/sec SYSTOLIC ICA/CCA RATIO:  1.6 ECA:  98 cm/sec RIGHT CAROTID ARTERY: There is a minimal amount of atherosclerotic plaque within the right carotid bulb (image 16), extending to involve the origin and proximal aspects the right internal carotid artery (image 23), not resulting in elevated peak systolic velocities within the interrogated course of the right internal carotid artery to suggest a hemodynamically significant stenosis. RIGHT VERTEBRAL ARTERY:  Antegrade flow LEFT CAROTID ARTERY: There is a moderate to large amount of left-sided atherosclerotic plaque (image 48), extending to involve the origin and proximal aspect the left internal carotid artery (image 55), not resulting in elevated peak systolic velocities within the interrogated course of the left internal carotid artery to suggest a hemodynamically significant stenosis. LEFT VERTEBRAL ARTERY:  Antegrade flow IMPRESSION: 1. Moderate to large amount of left-sided atherosclerotic plaque, not resulting in a hemodynamically significant stenosis. 2. Minimal amount of right-sided atherosclerotic plaque, not resulting in a hemodynamically significant stenosis. Electronically Signed   By: Sandi Mariscal M.D.   On: 03/28/2018 11:07   Dg Chest Port 1 View  Result Date:  03/27/2018 CLINICAL DATA:  Fall EXAM: PORTABLE CHEST 1 VIEW COMPARISON:  11/24/2013 FINDINGS: Mild right basilar opacity, likely atelectasis. No focal consolidation. No pleural effusion or pneumothorax. Heart is normal in size. Visualized osseous structures are within normal limits. IMPRESSION: No evidence of acute cardiopulmonary disease. Electronically Signed   By: Julian Hy M.D.   On: 03/27/2018 09:05   ASSESSMENT AND PLAN:  Nicholas Elliott y.o.malewith a known history of A fib, Arthritis, CLL< Glaucoma, dementia. CVA, HLD- Lives with wife,a ble to walk with a cane. Last night could not get up in bathroom and fell from commode.  1. Acute right pons lacunar infarct -patient presented with sudden onset weakness. His MRI shows previous multiple strokes. -Pt has history of chronic a fib. Takes only a baby aspirin. Not sure why he is not on chronic anticoagulation. Wife  was not able to explain. -Ultrasound carotid negative for any stenosis. Patient has significant atherosclerosis. -Case discussed with Dr. Doy Mince.--- Recommends anticoagulation eliquis -PT, OT, speech consulted and recommendations noted -pt declined overall   2. chronic a fib -rate  uncontrolled. Now started on po cardizem -He was not on any anticoagulation--now on po eliquis -H&H stable  3 h/o CLL Has chr0nically elevated WBC  4.mild hyperkalemia K 4.5   patient overall has declined. At present I don't know how much rehab he will be able to do given his mentation and overall  decline neurologically. Spoke with son Crockett Rallo. Will await for wife to come by.  Social worker for discharge planning.   Case discussed with Care Management/Social Worker. Management plans discussed with the patient, family and they are in agreement.  CODE STATUS: DNR  DVT Prophylaxis: eliquis  TOTAL TIME TAKING CARE OF THIS PATIENT: *40* minutes.  >50% time spent on counselling and coordination of care  POSSIBLE  D/C IN 1 DAYS, DEPENDING ON CLINICAL CONDITION.  Note: This dictation was prepared with Dragon dictation along with smaller phrase technology. Any transcriptional errors that result from this process are unintentional.  Fritzi Mandes M.D on 03/29/2018 at 8:27 AM  Between 7am to 6pm - Pager - [phone]  After 6pm go to [email or web address] - password [email or web address] [facility]  Office  [phone]  CC: Primary care physician; Tracie Harrier, MDPatient ID: Nicholas Elliott, male   DOB: 10/03/1921, 84 y.o.   MRN: [id number]

## 2018-03-29 NOTE — Clinical Social Work Note (Addendum)
CSW presented bed offers to patient's wife and she chose [facility], [place] contacted [facility], and they can accept patient once insurance has been approved.  SNF will start insurance auth process.  CSW received phone call that SNF has received insurance approval.  CSW updated bedside nurse and physician.  Patient to be d/c'ed today to [facility] room 10A.  Patient and family agreeable to plans will transport via ems RN to call report [phone], patient's granddaughter was at bedside and will inform patient's wife that patient is discharging today.  Evette Cristal, MSW, Cataract

## 2018-03-29 NOTE — Discharge Summary (Addendum)
[facility] NAME: Nicholas Elliott    MR#:  [id number]  DATE OF BIRTH:  10-19-1921  DATE OF ADMISSION:  03/27/2018 ADMITTING PHYSICIAN: Vaughan Basta, MD  DATE OF DISCHARGE: 03/29/2018  PRIMARY CARE PHYSICIAN: Tracie Harrier, MD    ADMISSION DIAGNOSIS:  CLL (chronic lymphocytic leukemia) (HCC) [C91.90] Generalized weakness [R53.1] Fall in home, initial encounter [W19.XXXA, V03.500]  DISCHARGE DIAGNOSIS:  Acute Right pons CVA SECONDARY DIAGNOSIS:   Past Medical History:  Diagnosis Date  . A-fib ([place]) 04/08/2014  . AF (paroxysmal atrial fibrillation) ([place]) 04/11/2015  . Arthritis   . Arthritis   . Carpal tunnel syndrome 01/23/2014  . Cerebral vascular accident ([place]) 04/11/2015  . Cervical spinal stenosis 01/23/2014  . CLL (chronic lymphocytic leukemia) ([place])   . CLL (chronic lymphocytic leukemia) ([place])   . DDD (degenerative disc disease), cervical 01/23/2014  . DDD (degenerative disc disease), lumbar 09/11/2014  . Degenerative arthritis of lumbar spine 01/23/2014  . Episode of syncope 04/11/2015  . Facial droop 04/02/2015  . Forehead contusion 04/03/2015  . Glaucoma   . Heart valve disease 04/08/2014  . High cholesterol   . High cholesterol   . HLD (hyperlipidemia) 04/08/2014  . Hyperglycemia 04/03/2015  . Left elbow contusion 04/03/2015  . Left hip pain 04/03/2015  . Migraine   . Migraine   . Nerve root pain 01/23/2014  . Neuritis or radiculitis due to rupture of lumbar intervertebral disc 09/11/2014  . Stroke ([place]) 04/02/2015  . TIA (transient ischemic attack)   . TIA (transient ischemic attack)     HOSPITAL COURSE:  Nicholas Elliott [phone] y.o.malewith a known history of A fib, Arthritis, CLL< Glaucoma, dementia. CVA, HLD- Lives with wife,a ble to walk with a cane. Last night could not get up in bathroom and fell from commode.  1.Acute right pons lacunar infarct -patient presented with sudden onset weakness. His  MRI shows previous multiple strokes. -Pt has history of chronic a fib. Takes only a baby aspirin. Not sure why he was not on chronic anticoagulation. Wife  was not able to explain. -Ultrasound carotid negative for any stenosis. Patient has significant atherosclerosis. -Case discussed with Dr. Doy Mince.---Recommends anticoagulation eliquis -PT, OT, speech consulted and recommendations noted -pt declined overall  with increased  2.chronic a fib -rate  uncontrolled. Now started on po cardizem -He was not on any anticoagulation--now on po eliquis -H&H stable  3 h/oCLL Has chronically elevated WBC  4.mild hyperkalemia K 4.5   patient overall has declined neurologically. Spoke with son Nicholas Elliott on the phone D/w wife in the room and mentioned about rehab, cva and now pt on eliquis. She is ok with pt going to [facility]. Palliative care to follow at rehab.  D/c to rehab today   CONSULTS OBTAINED:  Treatment Team:  Alexis Goodell, MD  DRUG ALLERGIES:   Allergies  Allergen Reactions  . Moxifloxacin Other (See Comments)    Patient's dementia symptoms increased.    DISCHARGE MEDICATIONS:   Allergies as of 03/29/2018      Reactions   Moxifloxacin Other (See Comments)   Patient's dementia symptoms increased.      Medication List    STOP taking these medications   aspirin EC 81 MG tablet   fesoterodine 4 MG Tb24 tablet Commonly known as:  TOVIAZ     TAKE these medications   apixaban 5 MG Tabs tablet Commonly known as:  ELIQUIS Take 1 tablet (5 mg total) by mouth 2 (two)  times daily.   atorvastatin 40 MG tablet Commonly known as:  LIPITOR Take 1 tablet (40 mg total) by mouth daily at 6 PM.   diltiazem 60 MG tablet Commonly known as:  CARDIZEM Take 1 tablet (60 mg total) by mouth every 12 (twelve) hours.   vitamin B-12 1000 MCG tablet Commonly known as:  CYANOCOBALAMIN Take 1,000 mcg by mouth daily.       If you experience worsening of your admission  symptoms, develop shortness of breath, life threatening emergency, suicidal or homicidal thoughts you must seek medical attention immediately by calling 911 or calling your MD immediately  if symptoms less severe.  You Must read complete instructions/literature along with all the possible adverse reactions/side effects for all the Medicines you take and that have been prescribed to you. Take any new Medicines after you have completely understood and accept all the possible adverse reactions/side effects.   Please note  You were cared for by a hospitalist during your hospital stay. If you have any questions about your discharge medications or the care you received while you were in the hospital after you are discharged, you can call the unit and asked to speak with the hospitalist on call if the hospitalist that took care of you is not available. Once you are discharged, your primary care physician will handle any further medical issues. Please note that NO REFILLS for any discharge medications will be authorized once you are discharged, as it is imperative that you return to your primary care physician (or establish a relationship with a primary care physician if you do not have one) for your aftercare needs so that they can reassess your need for medications and monitor your lab values.  DATA REVIEW:   CBC  Recent Labs  Lab 03/28/18 0509  WBC 98.0*  HGB 14.6  HCT 45.4  PLT 177    Chemistries  Recent Labs  Lab 03/27/18 0846 03/28/18 0509 03/29/18 0527  NA 142 145  --   K 4.5 5.2* 4.5  CL 109 109  --   CO2 27 29  --   GLUCOSE 91 85  --   BUN 20 13  --   CREATININE 0.78 0.80  --   CALCIUM 8.6* 8.9  --   AST 26  --   --   ALT 10  --   --   ALKPHOS 49  --   --   BILITOT 0.9  --   --     Microbiology Results   No results found for this or any previous visit (from the past 240 hour(s)).  RADIOLOGY:  Mr Brain 67 Contrast  Result Date: 03/27/2018 CLINICAL DATA:  84 y/o  M; fall,  weakness, history of stroke. EXAM: MRI HEAD WITHOUT CONTRAST TECHNIQUE: Multiplanar, multiecho pulse sequences of the brain and surrounding structures were obtained without intravenous contrast. COMPARISON:  03/08/2018 CT head.  04/02/2015 MRI head. FINDINGS: Brain: Linear loose diffusion in the right paramedian pons compatible with acute/early subacute infarction. No associated hemorrhage or mass effect. Multiple areas of chronic infarction are present within the right lateral frontal lobe, right anterior insula/basal ganglia, and the right parietal lobe. Mild chronic microvascular ischemic changes of the brain and moderate brain parenchymal volume loss. No evidence of acute intracranial hemorrhage, extra-axial collection, hydrocephalus, or herniation. Very small chronic infarctions are present in the bilateral cerebellar hemispheres, the right thalamus, and the left mid corona radiata. Vascular: Normal flow voids. Skull and upper cervical spine: Normal marrow signal.  Sinuses/Orbits: Right mastoid effusion. No abnormal signal of the left mastoid air cells or paranasal sinuses. Other: None. IMPRESSION: 1. Right paramedian pontine acute/early subacute infarction. No associated hemorrhage or mass effect. 2. Multiple chronic infarcts, microvascular ischemic changes, brain parenchymal volume loss as above. 3. Right mastoid effusion. These results will be called to the ordering clinician or representative by the Radiologist Assistant, and communication documented in the PACS or zVision Dashboard. Electronically Signed   By: Kristine Garbe M.D.   On: 03/27/2018 21:43   US Carotid Bilateral  Result Date: 03/28/2018 CLINICAL DATA:  CVA.  Syncopal episode.  History of hyperlipidemia. EXAM: BILATERAL CAROTID DUPLEX ULTRASOUND TECHNIQUE: Pearline Cables scale imaging, color Doppler and duplex ultrasound were performed of bilateral carotid and vertebral arteries in the neck. COMPARISON:  None. FINDINGS: Criteria:  Quantification of carotid stenosis is based on velocity parameters that correlate the residual internal carotid diameter with NASCET-based stenosis levels, using the diameter of the distal internal carotid lumen as the denominator for stenosis measurement. The following velocity measurements were obtained: RIGHT ICA:  86/20 cm/sec CCA:  57/84 cm/sec SYSTOLIC ICA/CCA RATIO:  1.8 ECA:  87 cm/sec LEFT ICA:  102/13 cm/sec CCA:  69/6 cm/sec SYSTOLIC ICA/CCA RATIO:  1.6 ECA:  98 cm/sec RIGHT CAROTID ARTERY: There is a minimal amount of atherosclerotic plaque within the right carotid bulb (image 16), extending to involve the origin and proximal aspects the right internal carotid artery (image 23), not resulting in elevated peak systolic velocities within the interrogated course of the right internal carotid artery to suggest a hemodynamically significant stenosis. RIGHT VERTEBRAL ARTERY:  Antegrade flow LEFT CAROTID ARTERY: There is a moderate to large amount of left-sided atherosclerotic plaque (image 48), extending to involve the origin and proximal aspect the left internal carotid artery (image 55), not resulting in elevated peak systolic velocities within the interrogated course of the left internal carotid artery to suggest a hemodynamically significant stenosis. LEFT VERTEBRAL ARTERY:  Antegrade flow IMPRESSION: 1. Moderate to large amount of left-sided atherosclerotic plaque, not resulting in a hemodynamically significant stenosis. 2. Minimal amount of right-sided atherosclerotic plaque, not resulting in a hemodynamically significant stenosis. Electronically Signed   By: Sandi Mariscal M.D.   On: 03/28/2018 11:07     Management plans discussed with the patient, family and they are in agreement.  CODE STATUS:     Code Status Orders  (From admission, onward)        Start     Ordered   03/27/18 1335  Do not attempt resuscitation (DNR)  Continuous    Question Answer Comment  In the event of cardiac or  respiratory ARREST Do not call a "code blue"   In the event of cardiac or respiratory ARREST Do not perform Intubation, CPR, defibrillation or ACLS   In the event of cardiac or respiratory ARREST Use medication by any route, position, wound care, and other measures to relive pain and suffering. May use oxygen, suction and manual treatment of airway obstruction as needed for comfort.      03/27/18 1334    Code Status History    Date Active Date Inactive Code Status Order ID Comments User Context   03/27/2018 1306 03/27/2018 1334 Full Code [id number]  Vaughan Basta, MD Inpatient   04/02/2015 2331 04/08/2015 2022 DNR [id number]  Ivor Costa, MD Inpatient    Advance Directive Documentation     Most Recent Value  Type of Advance Directive  Living will  Pre-existing out of facility DNR order (yellow  form or pink MOST form)  -  "MOST" Form in Place?  -      TOTAL TIME TAKING CARE OF THIS PATIENT: *40* minutes.    Fritzi Mandes M.D on 03/29/2018 at 1:08 PM  Between 7am to 6pm - Pager - [phone] After 6pm go to [email or web address] - password [email or web address]  Office  [phone]  CC: Primary care physician; Tracie Harrier, MD

## 2018-03-29 NOTE — Progress Notes (Addendum)
Notified MD 03/28/18 approx 2000 of tachycardia, pulse running between 115-125, cardizem added to med schedule for overnight  Notified Dr. Aliene Altes at approx 0430 of irregular breathing patterns, alternating between apnea and cheyne stokes. Counted 1 min and 30 secs with taking a breath but pulse has been between 90-130/min all night.   Telemetry in place

## 2018-03-29 NOTE — Progress Notes (Addendum)
ANTICOAGULATION CONSULT NOTE - Initial Consult  Pharmacy Consult for apixaban dosing Indication: atrial fibrillation  Allergies  Allergen Reactions  . Moxifloxacin Other (See Comments)    Patient's dementia symptoms increased.    Patient Measurements: Height: 6' (182.9 cm) Weight: 183 lb (83 kg) IBW/kg (Calculated) : 77.6 Heparin Dosing Weight: n/a  Vital Signs: Temp: 97.8 F (36.6 C) (07/10 0910) Temp Source: Oral (07/10 0910) BP: 124/80 (07/10 0910) Pulse Rate: 82 (07/10 0910)  Labs: Recent Labs    03/27/18 0846 03/28/18 0509  HGB 13.7 14.6  HCT 42.6 45.4  PLT 163 177  CREATININE 0.78 0.80  TROPONINI <0.03  --     Estimated Creatinine Clearance: 63.3 mL/min (by C-G formula based on SCr of 0.8 mg/dL).   Medical History: Past Medical History:  Diagnosis Date  . A-fib ([place]) 04/08/2014  . AF (paroxysmal atrial fibrillation) ([place]) 04/11/2015  . Arthritis   . Arthritis   . Carpal tunnel syndrome 01/23/2014  . Cerebral vascular accident ([place]) 04/11/2015  . Cervical spinal stenosis 01/23/2014  . CLL (chronic lymphocytic leukemia) ([place])   . CLL (chronic lymphocytic leukemia) ([place])   . DDD (degenerative disc disease), cervical 01/23/2014  . DDD (degenerative disc disease), lumbar 09/11/2014  . Degenerative arthritis of lumbar spine 01/23/2014  . Episode of syncope 04/11/2015  . Facial droop 04/02/2015  . Forehead contusion 04/03/2015  . Glaucoma   . Heart valve disease 04/08/2014  . High cholesterol   . High cholesterol   . HLD (hyperlipidemia) 04/08/2014  . Hyperglycemia 04/03/2015  . Left elbow contusion 04/03/2015  . Left hip pain 04/03/2015  . Migraine   . Migraine   . Nerve root pain 01/23/2014  . Neuritis or radiculitis due to rupture of lumbar intervertebral disc 09/11/2014  . Stroke ([place]) 04/02/2015  . TIA (transient ischemic attack)   . TIA (transient ischemic attack)     Medications:  No anticoagulation in PTA meds  Assessment:  Goal of Therapy:     Plan:   5 mg BID ordered by MD. OK for current patient parameters.  Counseling and handout provided to patient/family and medication questions answered.   Nicholas Elliott S 03/29/2018,10:00 AM

## 2018-03-29 NOTE — Plan of Care (Signed)
  Problem: Health Behavior/Discharge Planning: Goal: Ability to manage health-related needs will improve Outcome: Progressing   Problem: Clinical Measurements: Goal: Will remain free from infection Outcome: Progressing Goal: Diagnostic test results will improve Outcome: Progressing   Problem: Activity: Goal: Risk for activity intolerance will decrease Outcome: Progressing   Problem: Nutrition: Goal: Adequate nutrition will be maintained Outcome: Progressing   Problem: Elimination: Goal: Will not experience complications related to urinary retention Outcome: Progressing   Problem: Pain Managment: Goal: General experience of comfort will improve Outcome: Progressing   Problem: Safety: Goal: Ability to remain free from injury will improve Outcome: Progressing   Problem: Skin Integrity: Goal: Risk for impaired skin integrity will decrease Outcome: Progressing   Problem: Education: Goal: Knowledge of secondary prevention will improve Outcome: Progressing   Problem: Ischemic Stroke/TIA Tissue Perfusion: Goal: Complications of ischemic stroke/TIA will be minimized Outcome: Progressing

## 2018-03-29 NOTE — Progress Notes (Signed)
New referral for outpatient Palliative to follow at [facility] care received from [facility].Pateint to discharge today. Patient information faxed to referral. Flo Shanks RN, BSN, [facility] [place], hospital Liaison [phone]

## 2018-03-29 NOTE — Clinical Social Work Note (Signed)
Clinical Social Work Assessment  Patient Details  Name: NEEDHAM BIGGINS MRN: [id number] Date of Birth: [birth year]  Date of referral:  03/29/18               Reason for consult:  Facility Placement                Permission sought to share information with:  Facility Sport and exercise psychologist, Family Supports Permission granted to share information::  Yes, Verbal Permission Granted  Name::     Pedregon,Marie Spouse   [phone] or [place], Denham   [phone] or Lilian Coma [phone]   Agency::  SNF admissions  Relationship::     Contact Information:     Housing/Transportation Living arrangements for the past 2 months:  [employer] Information:  Medical Team, Spouse Patient Interpreter Needed:  None Criminal Activity/Legal Involvement Pertinent to Current Situation/Hospitalization:  No - Comment as needed Significant Relationships:  Spouse, Other Family Members, Adult Children Lives with:  Spouse Do you feel safe going back to the place where you live?  No Need for family participation in patient care:  Yes (Comment)  Care giving concerns: Patient's family feels he needs some short term rehab before he is able to return back home.   Social Worker assessment / plan:  Patient is a 84 year old male who is alert and oriented x2.  Patient is married and lives with his wife, patient has some dementia, assessment completed by speaking with patient's wife.  Patient's wife helps take care of him and he has some family which are able to help out occasionally.  Patient has been to rehab in the past, CSW explained to patient's wife how insurance will pay for the stay and reminded her what to expect at SNF.  Patient wife was requesting either [facility] or [facility], neither place has beds available, so patient's wife accepted offer of [facility].  Patient's wife did not have any other questions or concerns about patient going to SNF.  Employment status:   Retired Nurse, adult PT Recommendations:  [place] / Referral to community resources:  [place]  Patient/Family's Response to care:  Patient's family is in agreement to having patient go to SNF for short term rehab.  Patient/Family's Understanding of and Emotional Response to Diagnosis, Current Treatment, and Prognosis:  Patient's family is aware that patient may not make much progress but, they are hopeful he will not have to be at [facility] for very long.  Emotional Assessment Appearance:  Appears stated age Attitude/Demeanor/Rapport:    Affect (typically observed):  Appropriate, Stable, Calm Orientation:  Oriented to Self, Oriented to Place Alcohol / Substance use:  Not Applicable Psych involvement (Current and /or in the community):  No (Comment)  Discharge Needs  Concerns to be addressed:  Cognitive Concerns, Lack of Support, Care Coordination Readmission within the last 30 days:  No Current discharge risk:  Lack of support system Barriers to Discharge:  Ship broker, Continued Medical Work up   [facility], Nina 03/29/2018, 5:05 PM

## 2018-03-29 NOTE — Evaluation (Signed)
Objective Swallowing Evaluation: Type of Study: MBS-Modified Barium Swallow Study   Patient Details  Name: Nicholas Elliott MRN: [id number] Date of Birth: 08-16-1922  Today's Date: 03/29/2018 Time: SLP Start Time (ACUTE ONLY): 0945 -SLP Stop Time (ACUTE ONLY): 1045  SLP Time Calculation (min) (ACUTE ONLY): 60 min   Past Medical History:  Past Medical History:  Diagnosis Date  . A-fib ([place]) 04/08/2014  . AF (paroxysmal atrial fibrillation) ([place]) 04/11/2015  . Arthritis   . Arthritis   . Carpal tunnel syndrome 01/23/2014  . Cerebral vascular accident ([place]) 04/11/2015  . Cervical spinal stenosis 01/23/2014  . CLL (chronic lymphocytic leukemia) ([place])   . CLL (chronic lymphocytic leukemia) ([place])   . DDD (degenerative disc disease), cervical 01/23/2014  . DDD (degenerative disc disease), lumbar 09/11/2014  . Degenerative arthritis of lumbar spine 01/23/2014  . Episode of syncope 04/11/2015  . Facial droop 04/02/2015  . Forehead contusion 04/03/2015  . Glaucoma   . Heart valve disease 04/08/2014  . High cholesterol   . High cholesterol   . HLD (hyperlipidemia) 04/08/2014  . Hyperglycemia 04/03/2015  . Left elbow contusion 04/03/2015  . Left hip pain 04/03/2015  . Migraine   . Migraine   . Nerve root pain 01/23/2014  . Neuritis or radiculitis due to rupture of lumbar intervertebral disc 09/11/2014  . Stroke ([place]) 04/02/2015  . TIA (transient ischemic attack)   . TIA (transient ischemic attack)    Past Surgical History:  Past Surgical History:  Procedure Laterality Date  . HERNIA REPAIR    . SPINE SURGERY    . upper palate reparin     HPI: Pt is a 84 y.o. male with a known history of R MCA stroke in 03/2015 resulting in dysphagia and recommended to be on a dysphagia diet w/ thickened liquids, A fib, Arthritis, CLL< Glaucoma, Dementia, HLD- Lives with wife, able to walk with a cane. Last night could not get up in bathroom and fell from commode. Wife Called EMS, they helped to get back to bed, His  vitals were fine. Again in morning, pt was very weak and could not get out of bed so brought to ER. Pt is awake, and able to answer very basic questions and follow basic commands. Pt is HOH w/ aids in place. Speech was somewhat muffled/mumbled - Dysarthric(wife stated not like this at baseline). Wife reported pt has had long-standing difficulty eating/drinking w/ coughing noted "often" during meals BEFORE 2016. 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 He wears an upper denture plate w/ intact soft palate noted more posterior. Noted MBSS in 2016 w/ dx of dysphagia.    Subjective: pt awake, sitting in MBSS chair. Pt HOH; wear hearing aids. He nodded he could hear SLP when speaking louder; followed basic commands w/ cues.     Assessment / Plan / Recommendation  CHL IP CLINICAL IMPRESSIONS 03/29/2018  Clinical Impression Pt appears to present w/ MODERATE+ oropharyngeal phase dysphagia c/b delayed pharyngeal swallow initiation noted w/ trials of Nectar liquids via Cup(spilling to the pyriform sinuses) w/ laryngeal penetration followed by Aspiration noted during/post swallow. Cough effort was fair in response to the Aspiration. W/ trials of Honey consistency liquids via TSP, pt exhibited min more timely pharyngeal swallow initiation(at the level of the Valleculae) - no immediate laryngeal penetration or Aspiration was noted during the TSP trials of Honey consistency liquids at this eval. Pt exhibited similar timing of the swallow w/ trials of Puree. In addition, pt demonstrated  Moderate+ pharyngeal residue post swallowing indicating reduced pharyngeal pressure AND laryngeal excursion during the swallow. Pt was instructed to use a HEAD TURN LEFT which appeared to aid slightly in reducing bolus residue in the pyriform sinuses during trials; chin tuck did not appear beneficial. This pharyngeal residue was noted to become laryngeal penetration then Aspiration at/just below the level of the vocal cords  between swallows - pt was INSENSATE to this slight+ amount of Aspiration from bolus residue from the pharynx. During the oral phase, pt exhibited decreased bolus cohesiveness and control; increased time for A-P transfer and f/u, dry swallows; Left oral/buccal bolus residue. Lingual sweeps in attempts to clear w/ adequate but slow; fair success w/ reducing oral residue w/ f/u swallows; time. Min residual bolus material noted on Left side orally. No overt cervical Esophagus deficits were noted but shoulder obscured the view somewhat.  Pt is at increased risk for Aspiration w/ Pulmonary issues/decline d/t the degree of oropharyngeal phase dysphagia noted on this evaluation today. Pt was able to follow through w/ cues and instruction to follow swallowing strategies (LEFT HEAD TURN, f/u Dry swallows) but required verbal/tactile/visual cues or model(pt does have baseline dx of Dementia, multiple CVAs per chart). Recommend f/u w/ skilled ST Services upon discharge to continue education and follow through w/ swallowing strategies; education w/ family on pt's dysphagia and aspiration risk; aspiration precautions to follow during meals. Unsure of pt's ability to follow through w/ dysphagia therapy d/t comprehension and stamina at this time, but monitor. MD/NSG updated.   SLP Visit Diagnosis Dysphagia, oropharyngeal phase (R13.12);Dysphagia, pharyngeal phase (R13.13)  Attention and concentration deficit following --  Frontal lobe and executive function deficit following --  Impact on safety and function Moderate aspiration risk      CHL IP TREATMENT RECOMMENDATION 03/29/2018  Treatment Recommendations Therapy as outlined in treatment plan below     Prognosis 03/29/2018  Prognosis for Safe Diet Advancement Guarded  Barriers to Reach Goals Severity of deficits;Cognitive deficits;Time post onset  Barriers/Prognosis Comment --    CHL IP DIET RECOMMENDATION 03/29/2018  SLP Diet Recommendations Dysphagia 2 (Fine chop)  solids;Honey thick liquids  Liquid Administration via Cup;Spoon  Medication Administration Crushed with puree  Compensations Minimize environmental distractions;Slow rate;Small sips/bites;Lingual sweep for clearance of pocketing;Multiple dry swallows after each bite/sip;Follow solids with liquid;Hard cough after swallow  Postural Changes Seated upright at 90 degrees      CHL IP OTHER RECOMMENDATIONS 03/29/2018  Recommended Consults (No Data)  Oral Care Recommendations Oral care BID;Staff/trained caregiver to provide oral care  Other Recommendations Order thickener from pharmacy;Prohibited food (jello, ice cream, thin soups);Remove water pitcher;Have oral suction available      CHL IP FOLLOW UP RECOMMENDATIONS 03/29/2018  Follow up Recommendations Skilled Nursing facility      [facility] IP FREQUENCY AND DURATION 03/29/2018  Speech Therapy Frequency (ACUTE ONLY) min 3x week  Treatment Duration 2 weeks           CHL IP ORAL PHASE 03/29/2018  Oral Phase Impaired  Oral - Pudding Teaspoon --  Oral - Pudding Cup --  Oral - Honey Teaspoon --  Oral - Honey Cup --  Oral - Nectar Teaspoon --  Oral - Nectar Cup --  Oral - Nectar Straw --  Oral - Thin Teaspoon --  Oral - Thin Cup --  Oral - Thin Straw --  Oral - Puree --  Oral - Mech Soft --  Oral - Regular --  Oral - Multi-Consistency --  Oral - Pill --  Oral Phase - Comment decreased bolus cohesiveness and control; increased time for A-P transfer and f/u, dry swallows; Left oral/buccal bolus residue. Lingual sweeps in attempts to clear w/ adequate but slow; fair success w/ reducing oral residue w/ f/u swallows; time. Min residual bolus material noted on Left side orally.      CHL IP PHARYNGEAL PHASE 03/29/2018  Pharyngeal Phase Impaired  Pharyngeal- Pudding Teaspoon --  Pharyngeal --  Pharyngeal- Pudding Cup --  Pharyngeal --  Pharyngeal- Honey Teaspoon --  Pharyngeal --  Pharyngeal- Honey Cup --  Pharyngeal --  Pharyngeal- Nectar  Teaspoon --  Pharyngeal --  Pharyngeal- Nectar Cup --  Pharyngeal --  Pharyngeal- Nectar Straw --  Pharyngeal --  Pharyngeal- Thin Teaspoon --  Pharyngeal --  Pharyngeal- Thin Cup --  Pharyngeal --  Pharyngeal- Thin Straw --  Pharyngeal --  Pharyngeal- Puree --  Pharyngeal --  Pharyngeal- Mechanical Soft --  Pharyngeal --  Pharyngeal- Regular --  Pharyngeal --  Pharyngeal- Multi-consistency --  Pharyngeal --  Pharyngeal- Pill --  Pharyngeal --  Pharyngeal Comment delayed pharyngeal swallow initiation noted w/ trials of Nectar liquids via Cup(spilling to the pyriform sinuses) w/ laryngeal penetration followed by Aspiration noted during/post swallow. Cough effort was fair in response to the Aspiration. W/ trials of Honey consistency liquids via TSP, pt exhibited min more timely pharyngeal swallow initiation(at the level of the Valleculae) - no immediate laryngeal penetration or Aspiration was noted during the TSP trials of Honey consistency liquids at this eval. Pt exhibited similar timing of the swallow w/ trials of Puree. In addition, pt demonstrated Moderate+ pharyngeal residue post swallowing indicating reduced pharyngeal pressure AND laryngeal excursion during the swallow. Pt was instructed to use a HEAD TURN LEFT which appeared to aid slightly in reducing bolus residue in the pyriform sinuses during trials; chin tuck did not appear beneficial. Buildup of laryngeal penetration then Aspiration was noted from the pharyngeal residue between swallows.      CHL IP CERVICAL ESOPHAGEAL PHASE 03/29/2018  Cervical Esophageal Phase WFL  Pudding Teaspoon --  Pudding Cup --  Honey Teaspoon --  Honey Cup --  Nectar Teaspoon --  Nectar Cup --  Nectar Straw --  Thin Teaspoon --  Thin Cup --  Thin Straw --  Puree --  Mechanical Soft --  Regular --  Multi-consistency --  Pill --  Cervical Esophageal Comment --    CHL IP GO 05/13/2015  Functional Assessment Tool Used MBS  Functional  Limitations Swallowing  Swallow Current Status (K7425) CJ  Swallow Goal Status (Z5638) CJ  Swallow Discharge Status (V5643) CJ  Motor Speech Current Status (P2951) (None)  Motor Speech Goal Status (O8416) (None)  Motor Speech Goal Status (S0630) (None)  Spoken Language Comprehension Current Status (Z6010) (None)  Spoken Language Comprehension Goal Status (X3235) (None)  Spoken Language Comprehension Discharge Status (T7322) (None)  Spoken Language Expression Current Status (G2542) (None)  Spoken Language Expression Goal Status (H0623) (None)  Spoken Language Expression Discharge Status (J6283) (None)  Attention Current Status (T5176) (None)  Attention Goal Status (H6073) (None)  Attention Discharge Status (X1062) (None)  Memory Current Status (I9485) (None)  Memory Goal Status (I6270) (None)  Memory Discharge Status (J5009) (None)  Voice Current Status (F8182) (None)  Voice Goal Status (X9371) (None)  Voice Discharge Status (I9678) (None)  Other Speech-Language Pathology Functional Limitation Current Status (L3810) (None)  Other Speech-Language Pathology Functional Limitation Goal Status (F7510) (None)  Other Speech-Language Pathology Functional Limitation Discharge Status [phone]) (None)  Orinda Kenner, MS, CCC-SLP Watson,Katherine 03/29/2018, 11:49 AM

## 2018-03-29 NOTE — Progress Notes (Signed)
OT Cancellation Note  Patient Details Name: Nicholas Elliott MRN: [id number] DOB: Oct 21, 1921   Cancelled Treatment:    Reason Eval/Treat Not Completed: Patient at procedure or test/ unavailable. Order received, chart reviewed. Pt out of room for procedure/testing. Will re-attempt at later date/time as pt is available and medically appropriate.   Jeni Salles, MPH, MS, OTR/L ascom [phone] 03/29/18, 10:50 AM

## 2018-03-29 NOTE — Progress Notes (Signed)
Patient transported via EMS to [facility].  Clarise Cruz, RN

## 2018-03-29 NOTE — Progress Notes (Addendum)
PT Cancellation Note  Patient Details Name: Nicholas Elliott MRN: [id number] DOB: [birth year]   Cancelled Treatment:    Reason Eval/Treat Not Completed: Other (comment). Initial attempt, pt eating breakfast, but states he is agreeable to PT afterwards. Second attempt, pt is occupied with bathroom needs and notes is still not finished with breakfast (after 11 a.m). Prefer check later today if possible. Re attempt as the schedule allows.    Larae Grooms, PTA 03/29/2018, 11:07 AM

## 2018-03-29 NOTE — Progress Notes (Signed)
Report called to Tiffany, RN and EMS contacted for transport.  Clarise Cruz, RN

## 2018-03-30 LAB — HEMOGLOBIN A1C
HEMOGLOBIN A1C: 5.8 % — AB (ref 4.8–5.6)
MEAN PLASMA GLUCOSE: 120 mg/dL

## 2018-04-19 NOTE — Telephone Encounter (Signed)
Patient suffered a stroke and is in rehabilitation.  Will call back to reschedule.

## 2018-04-20 ENCOUNTER — Other Ambulatory Visit: Payer: Medicare Other | Admitting: Urology

## 2018-08-20 DEATH — deceased
